# Patient Record
Sex: Male | Born: 1935 | Race: White | Hispanic: No | Marital: Married | Smoking: Never smoker
Health system: Southern US, Community
[De-identification: ages and names within clinical notes are randomized; demographics above are authoritative.]

## PROBLEM LIST (undated history)

## (undated) DIAGNOSIS — M109 Gout, unspecified: Secondary | ICD-10-CM

## (undated) DIAGNOSIS — I2510 Atherosclerotic heart disease of native coronary artery without angina pectoris: Secondary | ICD-10-CM

## (undated) DIAGNOSIS — I219 Acute myocardial infarction, unspecified: Secondary | ICD-10-CM

## (undated) DIAGNOSIS — K219 Gastro-esophageal reflux disease without esophagitis: Secondary | ICD-10-CM

## (undated) DIAGNOSIS — T4145XA Adverse effect of unspecified anesthetic, initial encounter: Secondary | ICD-10-CM

## (undated) DIAGNOSIS — E785 Hyperlipidemia, unspecified: Secondary | ICD-10-CM

## (undated) DIAGNOSIS — Z9289 Personal history of other medical treatment: Secondary | ICD-10-CM

## (undated) DIAGNOSIS — T8859XA Other complications of anesthesia, initial encounter: Secondary | ICD-10-CM

## (undated) DIAGNOSIS — N200 Calculus of kidney: Secondary | ICD-10-CM

## (undated) DIAGNOSIS — I10 Essential (primary) hypertension: Secondary | ICD-10-CM

## (undated) DIAGNOSIS — I4901 Ventricular fibrillation: Secondary | ICD-10-CM

## (undated) DIAGNOSIS — C4490 Unspecified malignant neoplasm of skin, unspecified: Secondary | ICD-10-CM

## (undated) HISTORY — PX: LITHOTRIPSY: SUR834

## (undated) HISTORY — PX: SKIN CANCER EXCISION: SHX779

## (undated) HISTORY — PX: CYSTOSCOPY: SUR368

## (undated) HISTORY — PX: BACK SURGERY: SHX140

---

## 1996-12-03 HISTORY — PX: LUMBAR DISC SURGERY: SHX700

## 1997-12-03 HISTORY — PX: CORONARY ANGIOPLASTY WITH STENT PLACEMENT: SHX49

## 1998-07-01 ENCOUNTER — Inpatient Hospital Stay (HOSPITAL_COMMUNITY): Admission: EM | Admit: 1998-07-01 | Discharge: 1998-07-05 | Payer: Self-pay | Admitting: *Deleted

## 1998-07-19 ENCOUNTER — Encounter (HOSPITAL_COMMUNITY): Admission: RE | Admit: 1998-07-19 | Discharge: 1998-10-17 | Payer: Self-pay | Admitting: Cardiology

## 1999-04-25 ENCOUNTER — Encounter: Payer: Self-pay | Admitting: Emergency Medicine

## 1999-04-26 ENCOUNTER — Inpatient Hospital Stay (HOSPITAL_COMMUNITY): Admission: EM | Admit: 1999-04-26 | Discharge: 1999-04-26 | Payer: Self-pay | Admitting: Emergency Medicine

## 1999-10-27 ENCOUNTER — Emergency Department (HOSPITAL_COMMUNITY): Admission: EM | Admit: 1999-10-27 | Discharge: 1999-10-27 | Payer: Self-pay | Admitting: Emergency Medicine

## 1999-10-28 ENCOUNTER — Encounter: Payer: Self-pay | Admitting: Emergency Medicine

## 1999-11-09 ENCOUNTER — Ambulatory Visit (HOSPITAL_COMMUNITY): Admission: RE | Admit: 1999-11-09 | Discharge: 1999-11-09 | Payer: Self-pay | Admitting: Urology

## 1999-11-09 ENCOUNTER — Encounter: Payer: Self-pay | Admitting: Urology

## 1999-11-16 ENCOUNTER — Encounter: Payer: Self-pay | Admitting: Urology

## 1999-11-16 ENCOUNTER — Ambulatory Visit (HOSPITAL_COMMUNITY): Admission: RE | Admit: 1999-11-16 | Discharge: 1999-11-16 | Payer: Self-pay | Admitting: Urology

## 2000-05-17 ENCOUNTER — Encounter: Payer: Self-pay | Admitting: Orthopedic Surgery

## 2000-05-17 ENCOUNTER — Encounter: Admission: RE | Admit: 2000-05-17 | Discharge: 2000-05-17 | Payer: Self-pay | Admitting: Orthopedic Surgery

## 2002-05-15 ENCOUNTER — Encounter: Admission: RE | Admit: 2002-05-15 | Discharge: 2002-05-15 | Payer: Self-pay | Admitting: Family Medicine

## 2002-05-15 ENCOUNTER — Encounter: Payer: Self-pay | Admitting: Family Medicine

## 2003-12-04 HISTORY — PX: CYSTOSCOPY W/ STONE MANIPULATION: SHX1427

## 2003-12-25 ENCOUNTER — Emergency Department (HOSPITAL_COMMUNITY): Admission: EM | Admit: 2003-12-25 | Discharge: 2003-12-26 | Payer: Self-pay | Admitting: Emergency Medicine

## 2003-12-28 ENCOUNTER — Ambulatory Visit (HOSPITAL_COMMUNITY): Admission: AD | Admit: 2003-12-28 | Discharge: 2003-12-28 | Payer: Self-pay | Admitting: Urology

## 2004-06-17 ENCOUNTER — Emergency Department (HOSPITAL_COMMUNITY): Admission: EM | Admit: 2004-06-17 | Discharge: 2004-06-17 | Payer: Self-pay | Admitting: Emergency Medicine

## 2004-09-08 ENCOUNTER — Encounter (INDEPENDENT_AMBULATORY_CARE_PROVIDER_SITE_OTHER): Payer: Self-pay | Admitting: Specialist

## 2004-09-08 ENCOUNTER — Ambulatory Visit (HOSPITAL_COMMUNITY): Admission: RE | Admit: 2004-09-08 | Discharge: 2004-09-08 | Payer: Self-pay | Admitting: Gastroenterology

## 2004-12-03 DIAGNOSIS — I4901 Ventricular fibrillation: Secondary | ICD-10-CM

## 2004-12-03 HISTORY — PX: CORONARY ANGIOPLASTY WITH STENT PLACEMENT: SHX49

## 2004-12-03 HISTORY — DX: Ventricular fibrillation: I49.01

## 2005-09-05 ENCOUNTER — Inpatient Hospital Stay (HOSPITAL_COMMUNITY): Admission: AD | Admit: 2005-09-05 | Discharge: 2005-09-11 | Payer: Self-pay | Admitting: Cardiology

## 2008-11-17 ENCOUNTER — Observation Stay (HOSPITAL_COMMUNITY): Admission: EM | Admit: 2008-11-17 | Discharge: 2008-11-18 | Payer: Self-pay | Admitting: Emergency Medicine

## 2008-11-18 ENCOUNTER — Ambulatory Visit: Payer: Self-pay | Admitting: Vascular Surgery

## 2008-11-18 ENCOUNTER — Encounter (INDEPENDENT_AMBULATORY_CARE_PROVIDER_SITE_OTHER): Payer: Self-pay | Admitting: Family Medicine

## 2008-12-03 DIAGNOSIS — Z9289 Personal history of other medical treatment: Secondary | ICD-10-CM

## 2008-12-03 HISTORY — DX: Personal history of other medical treatment: Z92.89

## 2009-12-03 DIAGNOSIS — Z9289 Personal history of other medical treatment: Secondary | ICD-10-CM

## 2009-12-03 HISTORY — PX: CORONARY ARTERY BYPASS GRAFT: SHX141

## 2009-12-03 HISTORY — DX: Personal history of other medical treatment: Z92.89

## 2010-06-16 ENCOUNTER — Encounter: Payer: Self-pay | Admitting: Surgery

## 2010-06-16 ENCOUNTER — Inpatient Hospital Stay (HOSPITAL_COMMUNITY): Admission: RE | Admit: 2010-06-16 | Discharge: 2010-06-18 | Payer: Self-pay | Admitting: Cardiology

## 2010-06-17 ENCOUNTER — Ambulatory Visit: Payer: Self-pay | Admitting: Surgery

## 2010-06-23 ENCOUNTER — Inpatient Hospital Stay (HOSPITAL_COMMUNITY): Admission: RE | Admit: 2010-06-23 | Discharge: 2010-06-28 | Payer: Self-pay | Admitting: Surgery

## 2010-07-17 ENCOUNTER — Encounter: Admission: RE | Admit: 2010-07-17 | Discharge: 2010-07-17 | Payer: Self-pay | Admitting: Surgery

## 2010-07-17 ENCOUNTER — Ambulatory Visit: Payer: Self-pay | Admitting: Surgery

## 2010-12-23 ENCOUNTER — Encounter: Payer: Self-pay | Admitting: Emergency Medicine

## 2011-02-17 LAB — CBC
HCT: 29.6 % — ABNORMAL LOW (ref 39.0–52.0)
HCT: 29.7 % — ABNORMAL LOW (ref 39.0–52.0)
HCT: 32.7 % — ABNORMAL LOW (ref 39.0–52.0)
HCT: 39.1 % (ref 39.0–52.0)
HCT: 40.1 % (ref 39.0–52.0)
HCT: 44 % (ref 39.0–52.0)
Hemoglobin: 10.2 g/dL — ABNORMAL LOW (ref 13.0–17.0)
Hemoglobin: 10.3 g/dL — ABNORMAL LOW (ref 13.0–17.0)
Hemoglobin: 10.3 g/dL — ABNORMAL LOW (ref 13.0–17.0)
Hemoglobin: 11.1 g/dL — ABNORMAL LOW (ref 13.0–17.0)
Hemoglobin: 11.1 g/dL — ABNORMAL LOW (ref 13.0–17.0)
Hemoglobin: 13.2 g/dL (ref 13.0–17.0)
Hemoglobin: 13.6 g/dL (ref 13.0–17.0)
Hemoglobin: 15 g/dL (ref 13.0–17.0)
MCH: 32.7 pg (ref 26.0–34.0)
MCH: 32.9 pg (ref 26.0–34.0)
MCH: 33 pg (ref 26.0–34.0)
MCH: 33.1 pg (ref 26.0–34.0)
MCH: 33.3 pg (ref 26.0–34.0)
MCH: 33.4 pg (ref 26.0–34.0)
MCH: 33.7 pg (ref 26.0–34.0)
MCHC: 33.8 g/dL (ref 30.0–36.0)
MCHC: 33.9 g/dL (ref 30.0–36.0)
MCHC: 34 g/dL (ref 30.0–36.0)
MCHC: 34 g/dL (ref 30.0–36.0)
MCHC: 34 g/dL (ref 30.0–36.0)
MCHC: 34.2 g/dL (ref 30.0–36.0)
MCHC: 34.7 g/dL (ref 30.0–36.0)
MCV: 96.3 fL (ref 78.0–100.0)
MCV: 96.9 fL (ref 78.0–100.0)
MCV: 97.1 fL (ref 78.0–100.0)
MCV: 97.3 fL (ref 78.0–100.0)
MCV: 97.4 fL (ref 78.0–100.0)
MCV: 97.5 fL (ref 78.0–100.0)
Platelets: 104 10*3/uL — ABNORMAL LOW (ref 150–400)
Platelets: 108 10*3/uL — ABNORMAL LOW (ref 150–400)
Platelets: 116 10*3/uL — ABNORMAL LOW (ref 150–400)
Platelets: 123 10*3/uL — ABNORMAL LOW (ref 150–400)
Platelets: 135 10*3/uL — ABNORMAL LOW (ref 150–400)
Platelets: 150 10*3/uL (ref 150–400)
Platelets: 162 10*3/uL (ref 150–400)
RBC: 3.05 MIL/uL — ABNORMAL LOW (ref 4.22–5.81)
RBC: 3.05 MIL/uL — ABNORMAL LOW (ref 4.22–5.81)
RBC: 3.09 MIL/uL — ABNORMAL LOW (ref 4.22–5.81)
RBC: 3.39 MIL/uL — ABNORMAL LOW (ref 4.22–5.81)
RBC: 3.48 MIL/uL — ABNORMAL LOW (ref 4.22–5.81)
RBC: 4.01 MIL/uL — ABNORMAL LOW (ref 4.22–5.81)
RBC: 4.12 MIL/uL — ABNORMAL LOW (ref 4.22–5.81)
RBC: 4.55 MIL/uL (ref 4.22–5.81)
RDW: 12.7 % (ref 11.5–15.5)
RDW: 12.9 % (ref 11.5–15.5)
RDW: 13 % (ref 11.5–15.5)
RDW: 13.1 % (ref 11.5–15.5)
RDW: 13.1 % (ref 11.5–15.5)
RDW: 13.4 % (ref 11.5–15.5)
WBC: 12 10*3/uL — ABNORMAL HIGH (ref 4.0–10.5)
WBC: 12.6 10*3/uL — ABNORMAL HIGH (ref 4.0–10.5)
WBC: 14.1 10*3/uL — ABNORMAL HIGH (ref 4.0–10.5)
WBC: 6 10*3/uL (ref 4.0–10.5)
WBC: 6.8 10*3/uL (ref 4.0–10.5)
WBC: 6.9 10*3/uL (ref 4.0–10.5)
WBC: 9.9 10*3/uL (ref 4.0–10.5)

## 2011-02-17 LAB — GLUCOSE, CAPILLARY
Glucose-Capillary: 106 mg/dL — ABNORMAL HIGH (ref 70–99)
Glucose-Capillary: 110 mg/dL — ABNORMAL HIGH (ref 70–99)
Glucose-Capillary: 114 mg/dL — ABNORMAL HIGH (ref 70–99)
Glucose-Capillary: 114 mg/dL — ABNORMAL HIGH (ref 70–99)
Glucose-Capillary: 119 mg/dL — ABNORMAL HIGH (ref 70–99)
Glucose-Capillary: 126 mg/dL — ABNORMAL HIGH (ref 70–99)
Glucose-Capillary: 128 mg/dL — ABNORMAL HIGH (ref 70–99)
Glucose-Capillary: 129 mg/dL — ABNORMAL HIGH (ref 70–99)
Glucose-Capillary: 137 mg/dL — ABNORMAL HIGH (ref 70–99)
Glucose-Capillary: 141 mg/dL — ABNORMAL HIGH (ref 70–99)
Glucose-Capillary: 149 mg/dL — ABNORMAL HIGH (ref 70–99)

## 2011-02-17 LAB — COMPREHENSIVE METABOLIC PANEL
ALT: 19 U/L (ref 0–53)
ALT: 20 U/L (ref 0–53)
AST: 20 U/L (ref 0–37)
AST: 22 U/L (ref 0–37)
Albumin: 3.5 g/dL (ref 3.5–5.2)
Albumin: 3.8 g/dL (ref 3.5–5.2)
Alkaline Phosphatase: 47 U/L (ref 39–117)
Alkaline Phosphatase: 61 U/L (ref 39–117)
BUN: 13 mg/dL (ref 6–23)
BUN: 18 mg/dL (ref 6–23)
CO2: 22 mEq/L (ref 19–32)
CO2: 27 mEq/L (ref 19–32)
Calcium: 9.1 mg/dL (ref 8.4–10.5)
Calcium: 9.4 mg/dL (ref 8.4–10.5)
Chloride: 105 mEq/L (ref 96–112)
Chloride: 111 mEq/L (ref 96–112)
Creatinine, Ser: 0.87 mg/dL (ref 0.4–1.5)
Creatinine, Ser: 1.12 mg/dL (ref 0.4–1.5)
GFR calc Af Amer: >60 mL/min (ref >60)
GFR calc Af Amer: >60 mL/min (ref >60)
GFR calc non Af Amer: >60 mL/min (ref >60)
GFR calc non Af Amer: >60 mL/min (ref >60)
Glucose, Bld: 104 mg/dL — ABNORMAL HIGH (ref 70–99)
Glucose, Bld: 135 mg/dL — ABNORMAL HIGH (ref 70–99)
Potassium: 4.1 mEq/L (ref 3.5–5.1)
Potassium: 4.4 mEq/L (ref 3.5–5.1)
Sodium: 137 mEq/L (ref 135–145)
Sodium: 139 mEq/L (ref 135–145)
Total Bilirubin: 0.6 mg/dL (ref 0.3–1.2)
Total Bilirubin: 0.6 mg/dL (ref 0.3–1.2)
Total Protein: 6.1 g/dL (ref 6.0–8.3)
Total Protein: 6.5 g/dL (ref 6.0–8.3)

## 2011-02-17 LAB — POCT I-STAT 4, (NA,K, GLUC, HGB,HCT)
Glucose, Bld: 104 mg/dL — ABNORMAL HIGH (ref 70–99)
Glucose, Bld: 119 mg/dL — ABNORMAL HIGH (ref 70–99)
Glucose, Bld: 123 mg/dL — ABNORMAL HIGH (ref 70–99)
Glucose, Bld: 95 mg/dL (ref 70–99)
HCT: 29 % — ABNORMAL LOW (ref 39.0–52.0)
HCT: 29 % — ABNORMAL LOW (ref 39.0–52.0)
HCT: 29 % — ABNORMAL LOW (ref 39.0–52.0)
HCT: 33 % — ABNORMAL LOW (ref 39.0–52.0)
HCT: 38 % — ABNORMAL LOW (ref 39.0–52.0)
Hemoglobin: 13.9 g/dL (ref 13.0–17.0)
Potassium: 4.4 mEq/L (ref 3.5–5.1)
Potassium: 4.4 mEq/L (ref 3.5–5.1)
Sodium: 135 mEq/L (ref 135–145)
Sodium: 136 mEq/L (ref 135–145)
Sodium: 143 mEq/L (ref 135–145)

## 2011-02-17 LAB — BASIC METABOLIC PANEL
BUN: 19 mg/dL (ref 6–23)
BUN: 22 mg/dL (ref 6–23)
CO2: 23 mEq/L (ref 19–32)
CO2: 27 mEq/L (ref 19–32)
CO2: 28 mEq/L (ref 19–32)
Calcium: 8.7 mg/dL (ref 8.4–10.5)
Calcium: 8.9 mg/dL (ref 8.4–10.5)
Chloride: 105 mEq/L (ref 96–112)
Chloride: 107 mEq/L (ref 96–112)
Creatinine, Ser: 1.03 mg/dL (ref 0.4–1.5)
Creatinine, Ser: 1.3 mg/dL (ref 0.4–1.5)
GFR calc Af Amer: >60 mL/min (ref >60)
GFR calc Af Amer: >60 mL/min (ref >60)
GFR calc non Af Amer: 54 mL/min — ABNORMAL LOW (ref >60)
GFR calc non Af Amer: >60 mL/min (ref >60)
Glucose, Bld: 108 mg/dL — ABNORMAL HIGH (ref 70–99)
Glucose, Bld: 127 mg/dL — ABNORMAL HIGH (ref 70–99)
Glucose, Bld: 167 mg/dL — ABNORMAL HIGH (ref 70–99)
Potassium: 3.8 mEq/L (ref 3.5–5.1)
Potassium: 4.2 mEq/L (ref 3.5–5.1)
Potassium: 4.4 mEq/L (ref 3.5–5.1)
Sodium: 139 mEq/L (ref 135–145)
Sodium: 140 mEq/L (ref 135–145)
Sodium: 141 mEq/L (ref 135–145)

## 2011-02-17 LAB — POCT I-STAT 3, ART BLOOD GAS (G3+)
Acid-base deficit: 2 mmol/L (ref 0.0–2.0)
O2 Saturation: 96 %
O2 Saturation: 96 %
Patient temperature: 98.4
TCO2: 25 mmol/L (ref 0–100)
TCO2: 27 mmol/L (ref 0–100)
pCO2 arterial: 35.6 mmHg (ref 35.0–45.0)
pCO2 arterial: 45 mmHg (ref 35.0–45.0)
pH, Arterial: 7.357 (ref 7.350–7.450)
pH, Arterial: 7.397 (ref 7.350–7.450)
pO2, Arterial: 356 mmHg — ABNORMAL HIGH (ref 80.0–100.0)

## 2011-02-17 LAB — URINALYSIS, ROUTINE W REFLEX MICROSCOPIC
Bilirubin Urine: NEGATIVE
Ketones, ur: NEGATIVE mg/dL
Nitrite: NEGATIVE
Urobilinogen, UA: 1 mg/dL (ref 0.0–1.0)

## 2011-02-17 LAB — BLOOD GAS, ARTERIAL
Acid-base deficit: 1.7 mmol/L (ref 0.0–2.0)
Bicarbonate: 21.8 mEq/L (ref 20.0–24.0)
Drawn by: 181601
FIO2: 0.21 %
O2 Saturation: 98.5 %
Patient temperature: 98.6
TCO2: 22.8 mmol/L (ref 0–100)
pCO2 arterial: 32 mmHg — ABNORMAL LOW (ref 35.0–45.0)
pH, Arterial: 7.447 (ref 7.350–7.450)
pO2, Arterial: 101 mmHg — ABNORMAL HIGH (ref 80.0–100.0)

## 2011-02-17 LAB — DIFFERENTIAL
Basophils Absolute: 0 10*3/uL (ref 0.0–0.1)
Basophils Relative: 1 % (ref 0–1)
Eosinophils Absolute: 0.1 10*3/uL (ref 0.0–0.7)
Eosinophils Relative: 2 % (ref 0–5)
Lymphocytes Relative: 35 % (ref 12–46)
Lymphs Abs: 2.1 10*3/uL (ref 0.7–4.0)
Monocytes Absolute: 0.5 10*3/uL (ref 0.1–1.0)
Monocytes Relative: 8 % (ref 3–12)
Neutro Abs: 3.3 10*3/uL (ref 1.7–7.7)
Neutrophils Relative %: 55 % (ref 43–77)

## 2011-02-17 LAB — CREATININE, SERUM
Creatinine, Ser: 0.85 mg/dL (ref 0.4–1.5)
GFR calc Af Amer: >60 mL/min (ref >60)
GFR calc Af Amer: >60 mL/min (ref >60)
GFR calc non Af Amer: 55 mL/min — ABNORMAL LOW (ref >60)

## 2011-02-17 LAB — HEMOGLOBIN A1C
Hgb A1c MFr Bld: 5.4 % (ref <5.7)
Mean Plasma Glucose: 108 mg/dL (ref <117)

## 2011-02-17 LAB — POCT I-STAT, CHEM 8
BUN: 12 mg/dL (ref 6–23)
BUN: 21 mg/dL (ref 6–23)
Calcium, Ion: 1.15 mmol/L (ref 1.12–1.32)
Calcium, Ion: 1.24 mmol/L (ref 1.12–1.32)
Creatinine, Ser: 1 mg/dL (ref 0.4–1.5)
Glucose, Bld: 119 mg/dL — ABNORMAL HIGH (ref 70–99)
HCT: 31 % — ABNORMAL LOW (ref 39.0–52.0)
Sodium: 142 mEq/L (ref 135–145)
TCO2: 22 mmol/L (ref 0–100)
TCO2: 24 mmol/L (ref 0–100)

## 2011-02-17 LAB — PREPARE PLATELETS

## 2011-02-17 LAB — PROTIME-INR
INR: 0.99 (ref 0.00–1.49)
INR: 1.36 (ref 0.00–1.49)
Prothrombin Time: 13 seconds (ref 11.6–15.2)
Prothrombin Time: 16.7 seconds — ABNORMAL HIGH (ref 11.6–15.2)

## 2011-02-17 LAB — SURGICAL PCR SCREEN
MRSA, PCR: NEGATIVE
Staphylococcus aureus: NEGATIVE

## 2011-02-17 LAB — APTT
aPTT: 36 seconds (ref 24–37)
aPTT: 43 seconds — ABNORMAL HIGH (ref 24–37)

## 2011-02-17 LAB — TYPE AND SCREEN: Antibody Screen: NEGATIVE

## 2011-02-17 LAB — POCT I-STAT GLUCOSE: Glucose, Bld: 95 mg/dL (ref 70–99)

## 2011-02-17 LAB — MAGNESIUM: Magnesium: 2.7 mg/dL — ABNORMAL HIGH (ref 1.5–2.5)

## 2011-04-17 NOTE — Assessment & Plan Note (Signed)
OFFICE VISIT   Jose [name], Jose [name]  DOB:  1936/02/28                                        July 17, 2010  CHART #:  [id number]   HISTORY OF PRESENT ILLNESS:  The patient is status post coronary artery  bypass grafting x4 done by Dr. Laneta Simmers, June 24, 2010.  His postoperative  course was pretty much unremarkable and he was discharged to home postop  day #4 in stable condition.  The patient presents today for his 3-week  followup visit.  The patient is without complaints.  He saw Dr. Lynnea Ferrier  last week, who felt that he was progressing well.  He denies change in  any of his medicines and plans to follow up in the next several months.  The patient had some left-sided chest numbness, but other than that  denies any incisional pain.  He is walking about a mile and a half at a  time with a slight incline without any difficulty.  He denies any  shortness of breath.  The patient is tolerating diet well.  No nausea or  vomiting.  He plans to proceed with cardiac rehab in the next week.  The  patient states [facility] and will [facility] cardiac [facility], but  if he can do the same activities at the [facility], will just go to the [facility]  for rehab.   PHYSICAL EXAMINATION:  Vital Signs:  Blood pressure 148/88, pulse 72,  respirations 16, and O2 sats 95% on room air.  Respiratory:  Clear to  auscultation bilaterally.  Cardiac:  Regular rate and rhythm.  No  murmurs noted.  Chest:  Sternum is stable.  Abdomen:  Bowel sounds x4.  Soft and nontender.  Extremities:  No edema noted.  Incisions:  All  incisions are healing well.   STUDIES:  The patient had a PA and lateral chest x-ray done today,  July 17, 2010, which is stable.  No pleural effusions noted.  Sternal  wires intact.  Lungs with good aeration.   IMPRESSION AND PLAN:  The patient is progressing well postoperatively.  Plan at this time is to continue ambulating 3-4 times per day.  The  patient is instructed still  no heavy lifting over 10 pounds for another  2 months.  He is encouraged to do cardiac rehab.  The patient will plan  to follow up with Dr. Lynnea Ferrier as directed.  At this time, the patient is  felt to be stable from surgical  standpoint and we will release him from the office.  He is instructed to  contact us if he has any surgical questions, but will continue to follow  up with Dr. Lynnea Ferrier.  The patient is in agreement.   Evelene Croon, M.D.  Electronically Signed   KMD/MEDQ  D:  07/17/2010  T:  07/18/2010  Job:  [id number]   cc:   Ritta Slot, MD

## 2011-04-17 NOTE — Procedures (Signed)
EEG NUMBER:  [id number].   CLINICAL HISTORY:  A 75 year old man admitted for syncope.  EEG is  performed for evaluation.  This is a portable EEG done with photic  stimulation and hyperventilation.  The patient is described as awake and  alert.   DESCRIPTION:  The dominant rhythm in this tracing is a moderate  amplitude alpha rhythm of 10 Hz, which predominates posteriorly, appears  without abnormal asymmetry, and attenuates with eye opening and closing.  Low amplitude fast activity is seen frontally and centrally and appears  without abnormal asymmetry.  No focal slowing is noted and no  epileptiform discharge is seen.  The patient remained in the awake state  throughout the recording.  Photic stimulation produced symmetric driving  responses.  Hyperventilation produced some increased prominence of the  alpha without appearance of focal abnormality.  Single channel devoted  to EKG revealed sinus rhythm throughout at the rate of approximately 60  beats minute.   CONCLUSION:  Normal study in awake state.      Michael L. Thad Ranger, M.D.  Electronically Signed     ZOX:WRUE  D:  11/18/2008 17:19:53  T:  11/19/2008 04:23:45  Job #:  [id number]

## 2011-04-20 NOTE — Op Note (Signed)
NAME:  Jose [name], Jose [name]                          ACCOUNT NO.:  [id number]   MEDICAL RECORD NO.:  [id number]                   PATIENT TYPE:  AMB   LOCATION:  DAY                                  FACILITY:  [facility]   PHYSICIAN:  Ronald L. Ovidio Hanger, M.D.           DATE OF BIRTH:  09-16-1936   DATE OF PROCEDURE:  12/28/2003  DATE OF DISCHARGE:                                 OPERATIVE REPORT   DIAGNOSES:  Right distal ureterolithiasis with obstruction, nausea, and  vomiting.   OPERATIVE PROCEDURE:  1. Cystourethroscopy and right ureteroscopy with holmium laser lithotripsy.  2. Stone basket extraction.  3. Placement of right double-J stent.   SURGEON:  Lucrezia Starch. Earlene Plater, MD   ASSISTANT:  Susanne Borders, MD   ANESTHESIA:  LMA.   ESTIMATED BLOOD LOSS:  Negligible.   TUBES:  A 26 cm 6 French Cook double-pigtail stent.   COMPLICATIONS:  None.   INDICATION FOR PROCEDURE:  Jose [name] is a very nice, 75 year old, white  male, who had a known 4 x 7 mm right upper ureteral stone.  He was seen  yesterday and really was asymptomatic.  The stone had moved into the lower  ureter.  He wanted to give it a chance to pass.  It was oriented in the 4 mm  direction.  But today, he developed nausea, vomiting, had some sweats and  chills, and really could not be controlled pain-wise.  After understanding  the risks, benefits, and alternatives, he has elected to proceed with the  above-procedure.   PROCEDURE IN DETAIL:  The patient was placed in a supine position.  After  proper LMA anesthesia, was placed in the dorsal lithotomy position and  prepped and draped with Betadine in a sterile fashion.  Cystourethroscopy  was performed with a 22.5 French Olympus panendoscope, and the orifices were  identified, each noted to have moderate trilobar hypertrophy; grade 1  trabeculation was noted.  Efflux of urine was noted clear.  Urine was noted  from the left ureteral orifice but not from the right ureteral  orifice.  Under fluoroscopic guidance, a 0.038 French Teflon-coated guidewire was  placed into the right renal pelvis and utilizing the short thin  ureteroscope, the ureteral orifice was negotiated, and the stone was  visualized, and the lower third ureter was quite large, black and felt to be  quite hard.  Utilizing the 365 holmium laser fiber, we had initial treatment  of 0.5 frequency of 5.  The stone was fragmented.  It was noted to be quite  hard, so the frequency was increased to 10, and it was finally broken into  approximately 6 fragments. Utilizing the nitinol basket, each fragment was  extracted and displaced into the bladder.  They were quite tight at the  distal orifice, but it opened and dilated without perforation or tearing of  the mucosa of significance.  Reinspection of the lower third ureter  revealed  there were no perforations.  No more fragments were remaining.  Under  fluoroscopic guidance, a 26 cm 6 French Cook double-pigtail stent was placed  and noted to be in good position in the right renal pelvis and within the  bladder.  It was felt that with the significant manipulation, edema, etc.,  that no prolapsed ring would be attached, and it would be left in for  approximately 2 weeks.  The  bladder was drained.  The stent was in good position fluoroscopically in the  right renal pelvis and within the bladder.  The bladder was drained.  The  panendoscope was removed, and the patient was taken to the recovery room  stable, and fragments will be presented for stone analysis.                                               Ronald L. Ovidio Hanger, M.D.    RLD/MEDQ  D:  12/28/2003  T:  12/28/2003  Job:  [id number]

## 2011-04-20 NOTE — Cardiovascular Report (Signed)
Jose [name], Jose [name]                ACCOUNT NO.:  [id number]   MEDICAL RECORD NO.:  [id number]          PATIENT TYPE:  INP   LOCATION:  6522                         FACILITY:  [facility]   PHYSICIAN:  Madaline Savage, M.D.DATE OF BIRTH:  06-15-1936   DATE OF PROCEDURE:  09/10/2005  DATE OF DISCHARGE:                              CARDIAC CATHETERIZATION   PROCEDURES PERFORMED:  1.  Selective coronary angiography by Judkins technique.  2.  Percutaneous coronary stenting of the mid left anterior descending      coronary artery.   COMPLICATIONS:  None.   ENTRY SITE:  Right femoral.   DYE USED:  Omnipaque.   CATHETERS USED:  A 6-French XB-4 LAD by [employer], angioplasty wire was a  Research scientist (physical sciences) by [employer]. Stent used was a 2.5 x 13 mm Cypher stent. I used a  2.75 x 6 mm cutting balloon angioplasty at the inflow portion of the stent  which was hazy after successful stenting.   PATIENT PROFILE:  Jose [name] is a pleasant 75 year old gentleman who has  previously had mid-LAD stenting for acute myocardial infarction on July 01, 1998. The patient again had an acute inferior myocardial infarction on the  night of September 05, 2005 and Dr. Jeanella Cara did a cardiac catheterization and  percutaneous intervention on him. The mid-RCA stent was patent. There was  occlusive thrombus and a stenosis in the distal RCA just before it  bifurcated into PLA and PDA that Dr. Jacinto Halim treated with AngioJet therapy  followed by coronary artery stenting. Today's procedure was electively  performed because during cardiac catheterization on September 05, 2005 for  acute inferior MI. Dr. Jacinto Halim also noted that the LAD stenosis just beyond  diagonal branch #2 and beyond septal perforator branch #3 contained worse  stenosis then it did in 1999. It was previously 60-70% and now was 85%. So  we did this intervention today after performing right coronary angiography  and then focusing in on the LAD lesion which was best viewed in a 13  degrees  RAO projection with 31 degrees cranial angulation.   The patient was treated with heparin and then ReoPro and the ACT during the  case was 340 seconds. The guide catheter provided excellent backup support.  The guidewire traversed the stenosis in the LAD very well. The stent crossed  the lesion with ease and was deployed to 15 atmospheres on two occasions  causing marked ST-segment elevation which limited balloon time to 50 seconds  the first time and 45 seconds the second time. After doing the balloon  inflation on the stent, the stent itself showed a step-up and step-down in  the inflow portion of the LAD. Leading into the stent was a hazy, so I used  a 2.75 x 6 mm cutting balloon inflated to 6 atmospheres of pressure and  after doing so the area at the inflow portion of the stent was reduced from  about 40% stenosis to 0% residual. There was preservation of TIMI III flow.   The patient was then transferred to the holding unit in stable condition and  no  complications occurred.   FINAL DIAGNOSIS:  Successful direct coronary artery stenting of the mid left  anterior descending coronary artery with a touch up to the inflow portion of  the stent with a cutting balloon. Lesion was reduced from 85% to 0. The  patient will receive ReoPro for another 12 hours and will continue on his  Plavix which was started on September 05, 2005.           ______________________________  Madaline Savage, M.D.     WHG/MEDQ  D:  09/10/2005  T:  09/10/2005  Job:  [id number]   cc:   Cardiac Catheter Lab   Medical Records

## 2011-04-20 NOTE — Discharge Summary (Signed)
Jose [name], Jose [name]                ACCOUNT NO.:  [id number]   MEDICAL RECORD NO.:  [id number]          PATIENT TYPE:  INP   LOCATION:  6522                         FACILITY:  [facility]   PHYSICIAN:  Madaline Savage, M.D.DATE OF BIRTH:  03-19-1936   DATE OF ADMISSION:  09/05/2005  DATE OF DISCHARGE:  09/11/2005                                 DISCHARGE SUMMARY   DATE OF ADMISSION:  September 05, 2005.   DATE OF DISCHARGE:  September 11, 2005   ADMISSION DIAGNOSES:  1.  Acute ST inferior myocardial infarction with ventricular fibrillation      arrest.  2.  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  3.  Hypertension.  4.  Hyperlipidemia.  5.  History of gout.   DISCHARGE DIAGNOSES:  1.  Acute ST inferior myocardial infarction with ventricular fibrillation      arrest.  2.  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  3.  Hypertension.  4.  Hyperlipidemia.  5.  History of gout.   PROCEDURES:  1.  Emergent cardiac catheterization September 05, 2005 which revealed      occlusion of the right coronary artery.  Patient underwent AngioJet      percutaneous coronary intervention, CYPHER stenting of the right      coronary artery.  Left anterior descending showed a 90% stenosis with a      99% first diagonal, less than a 1 mm vessel.  Ejection fraction 55-60%      September 05, 2005.  2.  Percutaneous coronary intervention and stenting of the left anterior      descending, 85% to zero with a CYPHER stent by Dr. Chanda Busing.   BRIEF HISTORY:  The  patient is a 75 year old white male medical patient of  Dr. Elsie Lincoln.  As noted above, he had a previous MI with PCA and stenting of  his RCA in 1997.  He has undergone repeat catheterization in 2000 which  showed minimal end-stent stenosis of the RCA and a proximal LAD with a 50%  lesion in the mid LAD with a 75% lesion.  He underwent Cardiolite on March 15, 2005.  At that time, there was no evidence of ischemia.  EF was 59% and  no change from his previous studies.   The patient presented today around 11 a.m.  While aerating his lawn, he  began to have mid sternal chest pain with dizziness.  No nausea.  Eventually, he asked his wife to take him to the emergency room.  EKG showed  acute inferior MI.  He was at [facility]  and transported  emergently from [facility] ER by [facility] to the cath lab at [facility]  On arrival to the cath lab, the patient fibrillated and had to be  shocked twice with 200 joules biphasic the patient returned to a sinus  brady.  He did lose consciousness, but came back and was conscious with good  mentation.   PAST MEDICAL HISTORY:  As above.  He has a history of back surgery, foot  surgery, hypertension, hyperlipidemia, gout, and GERD.   For further history and physical, please see the dictated note.   MEDICATIONS ON ADMISSION:  Crestor 10 mg daily, aspirin 81 mg daily, and  atenolol 25 mg daily.   HOSPITAL COURSE:  Patient was admitted, taken directly to the cath lab from  the ER at [facility].  There patient cath showed occlusion of the  distal RCA beyond the site of the initial stent from 1997.  This area  underwent AngioJet, the thrombus was removed, PCI and stenting was performed  by Dr. Jacinto Halim.  The patient also had significant disease with a 99% ostial  first diagonal and a distal 90% stenosis.  His ejection fraction was between  55 and 60%.  He tolerated this procedure well, returned to the coronary care  unit, where he  was stabilized.  Symptoms resolved and he was doing well, he  was opted to go back on September 10, 2005 at which time a repeat cath and PCI  of his LAD was performed.  Dr. Truett Perna note notes that it had a reduction  from 85 to 0% stenosis using a 2.5 x 13 CYPHER stent.  The patient tolerated  the procedure well.  The following morning, he was alert and oriented.  His  groin site looked good.  He was having no chest discomfort.  Labs were  normal.  Hemoglobin was 14.4, hematocrit was 41, white count was 72,000,  platelets were 208,000.  Electrolytes were normal.  BUN was 16, creatinine  1.2, glucose 107.  CK-MBs were normal.  LDL on admission was 73, HDL was 41.  At that point, it was Dr. Truett Perna opinion he could be discharged home and  planned to see the patient back in two weeks.   DISCHARGE MEDICATIONS:  Aldactone 25 mg daily, Altace 2.5 mg daily, Coreg  6.25 mg b.i.d., Crestor 10 mg daily, Ecotrin 325 mg daily, Plavix 75 mg  daily, Protonix 40 mg daily.   DISCHARGE ACTIVITY:  Light to moderate.  He is not to return to work until  he returns to see Dr. Elsie Lincoln in two weeks.  Our office will call with the  appointment.   CONDITION ON DISCHARGE:  Improved.   TSH on admission was 1.7.      Eber Hong, P.A.    ______________________________  Madaline Savage, M.D.    WDJ/MEDQ  D:  09/11/2005  T:  09/11/2005  Job:  [id number]   cc:   Ellin Saba., M.D.  [address] [place]  [place] [address]

## 2011-04-20 NOTE — Cardiovascular Report (Signed)
Jose [name], [name]                ACCOUNT NO.:  [id number]   MEDICAL RECORD NO.:  [id number]          PATIENT TYPE:  EMS   LOCATION:  ED                           FACILITY:  [facility]   PHYSICIAN:  Jose Hilts. Jacinto Halim, MD       DATE OF BIRTH:  December 27, 1935   DATE OF PROCEDURE:  09/05/2005  DATE OF DISCHARGE:                              CARDIAC CATHETERIZATION   PROCEDURE PERFORMED:  1.  Defibrillation for VF arrest.  2.  Left ventriculography.  3.  Selective right and left coronary angiography.  4.  Placement of temporary transvenous pacemaker.  5.  Rheolytic thrombectomy using Angioject catheter.  6.  Percutaneous transluminal coronary angiography and stenting of the      distal right coronary artery.   INDICATIONS FOR PROCEDURE:  Jose [name] is a 75 year old gentleman with a  history of known coronary artery disease status post angioplasty to his mid  RCA in the past for an acute myocardial infarction on June 14, 1998, and  history of a mid LAD stenosis of about 70% which was being medically managed  was doing well until this morning when he was mowing his lawn and suddenly  had severe chest discomfort.  Given this, he was admitted to [facility] and was found to have ST elevation myocardial infarction in the  inferior and lateral leads.  Given this, he was emergently transferred to  [facility] for emergent cardiac catheterization.  In the cath lab,  patietn had cardiac arrest and the ventricular fibrillation was shocked to  sinus rhythm and caardiac catheterization was performed emergently.   HEMODYNAMIC DATA:  Left ventricular pressure 145/10 with end diastolic  pressure of 18 mmHg.  The aortic pressure was 141/75 with a mean of 109  mmHg.   ANGIOGRAPHIC DATA:  Left ventricle:  The left ventricular systolic function was normal with  ejection fraction estimated at 55-60%.  There was mild basal and  inferoposterior wall hypokinesis.  There is no significant mitral  regurgitation.   Right coronary artery:  The right coronary artery is a large vessel and is a  dominant vessel.  The RCA is occluded in its distal segment with heavy  thrombus burden.  The distal RCA vascular bed was not visualized.   Left main:  The left main is a large caliber vessel and is angiographically  normal.   Circumflex:  The circumflex is a large caliber vessel.  This gives origin to  a large OM1, OM2, and moderate size OM3, and continues in the AV groove and  gives AV groove branch.  It has mild luminal irregularities.   Left anterior descending:  The LAD is a large caliber vessel.  It gives  origin to a moderate size first diagonal, small second diagonal, a moderate  size third diagonal, and several small diagonals.  The LAD has mild luminal  irregularity except after the origin of her third diagonal, there is a high  grade 90% stenosis.  The second diagonal also has an ostial 99% stenosis,  however, this is a very small less than 1  mm size vessel.  There were faint  collaterals noted to the RCA.   INTERVENTIONAL DATA:  Successful rheolytic thrombectomy using Angioject  catheter.  TIMI III flow was established immediately after thrombectomy.  Successful PTCA and stenting of the distal RCA with a 3 by 23 mm Cypher  deployed at 10 atmospheres pressure.  The stent was post dilated with 2.25  by 15 mm Quantum at 18 atmospheres pressure.  Overall, the stenosis was  reduced from 100% to 0% with TIMI III flow maintained at the end of the  procedure.   RECOMMENDATIONS:  1.  The fact that he has normal end diastolic function and there is a focal      high grade stenosis in the mid LAD, he will be discharged home once he      has acute recovery from his acute myocardial infarction, he will be      brought back for elective PTCA of the LAD.  2.  Continued risk factor modification is indicated.   A total of 155 mL of contrast was utilized for diagnostic and interventional   procedure.   TECHNIQUES OF PROCEDURE:  In the usual sterile technique, using a 7 French  right femoral artery access and a 6 French right femoral venous access, left  heart catheterization was performed using a 6 [place] multipurpose B2  catheter.  A 6 [place] multipurpose B2 catheter was advanced to the ascending  over a 0.035 inch J-wire.  The catheter was gently advanced to the left  ventricle.  Left ventricular pressure was monitored.  Hand contrast  injection of the left ventricle was performed, both in LAO and RAO  projections.  The catheter was flushed and pulled back into the ascending  aorta and pressure gradient across the aortic valve was monitored.  The  right coronary artery was selectively engaged and angiography was performed.  Then, the catheter was pulled back into the arch of the aorta and left  subclavian arteriogram with visualization of LIMA was performed.  A 6 French  Judkins diagnostic catheter was utilized to engage the left main coronary  artery and angiography was performed.  Then, the catheter was pulled out of  the body in the usual fashion.   TECHNIQUES OF INTERVENTION:  A balloon tip pacemaker was inserted through  the venous sheath into the right ventricle and ventricular capture was  easily obtained.  This was put on a standby mode.  Then, using a 95 mm by  0.014 inch Asahi Prowater guide-wire, the occluded artery was easily  traversed and the tip of the guide-wire was carefully placed in the distal  RCA.  Then, using 6 [place] Angioject catheter, rheolytic thrombectomy was  performed.  TIMI III flow was established.  During the procedure, ReoPro and  heparin were utilized for anticoagulation.  After establishing TIMI III  flow, the unstable plaque which was noted in the distal LAD was easily  visualized.  Measuring the lesion length and covering the stent into the  normal segment, a 2 by 23 mm Cypher was carefully positioned at the bifurcation of the distal  RCA which had a large PLA and a large PDA.  The  stent was deployed at 16 atmospheres pressure for 40 seconds.  The balloon  was deflated, pulled back into the guiding catheter, angiography was  performed.  Excellent results were noted.  This stent was post dilated with  a 3.25 by 15 mm Quantum balloon at 18 atmospheres of pressure.  Then, the  balloon was deflated and pulled back in the guiding catheter and angiography  was performed.  Excellent results were noted.  There was no evidence of  dissection or thrombus during the procedure.   Please note as soon as I established TIMI III flow into the RCA, I did  administer 60 mg intracoronary Adenosine.  Several times, I did also  administered 200 mg intracoronary nitroglycerin.  Overall, there were no  immediate complications.  Excellent reperfusion was obtained from transfer  to the balloon, time being less than 75 minutes.   RECOMMENDATIONS:  The patient will be transferred to the intensive care unit  for observation.      Jose Hilts. Jacinto Halim, MD  Electronically Signed     JRG/MEDQ  D:  09/05/2005  T:  09/05/2005  Job:  [id number]   cc:   Madaline Savage, M.D.  Fax: [phone]

## 2011-09-07 LAB — POCT CARDIAC MARKERS
CKMB, poc: 4 ng/mL (ref 1.0–8.0)
CKMB, poc: 4.3 ng/mL (ref 1.0–8.0)
Troponin i, poc: <0.05 ng/mL (ref 0.00–0.09)

## 2011-09-07 LAB — URINALYSIS, ROUTINE W REFLEX MICROSCOPIC
Bilirubin Urine: NEGATIVE
Hgb urine dipstick: NEGATIVE
Ketones, ur: NEGATIVE mg/dL
Specific Gravity, Urine: 1.013 (ref 1.005–1.030)
pH: 6.5 (ref 5.0–8.0)

## 2011-09-07 LAB — POCT I-STAT, CHEM 8
Glucose, Bld: 94 mg/dL (ref 70–99)
HCT: 47 % (ref 39.0–52.0)
Hemoglobin: 16 g/dL (ref 13.0–17.0)
Potassium: 4.4 mEq/L (ref 3.5–5.1)

## 2011-09-07 LAB — DIFFERENTIAL
Eosinophils Absolute: 0 10*3/uL (ref 0.0–0.7)
Lymphocytes Relative: 19 % (ref 12–46)
Lymphs Abs: 1.2 10*3/uL (ref 0.7–4.0)
Neutro Abs: 4.6 10*3/uL (ref 1.7–7.7)
Neutrophils Relative %: 72 % (ref 43–77)

## 2011-09-07 LAB — URINE CULTURE: Colony Count: NO GROWTH

## 2011-09-07 LAB — CK TOTAL AND CKMB (NOT AT ARMC)
CK, MB: 5.8 ng/mL — ABNORMAL HIGH (ref 0.3–4.0)
Relative Index: 3 — ABNORMAL HIGH (ref 0.0–2.5)

## 2011-09-07 LAB — BASIC METABOLIC PANEL
BUN: 11 mg/dL (ref 6–23)
BUN: 14 mg/dL (ref 6–23)
Calcium: 9.3 mg/dL (ref 8.4–10.5)
Calcium: 9.5 mg/dL (ref 8.4–10.5)
Creatinine, Ser: 0.98 mg/dL (ref 0.4–1.5)
GFR calc Af Amer: >60 mL/min (ref >60)
GFR calc non Af Amer: >60 mL/min (ref >60)
GFR calc non Af Amer: >60 mL/min (ref >60)
Glucose, Bld: 99 mg/dL (ref 70–99)
Sodium: 142 mEq/L (ref 135–145)

## 2011-09-07 LAB — GLUCOSE, CAPILLARY: Glucose-Capillary: 99 mg/dL (ref 70–99)

## 2011-09-07 LAB — CARDIAC PANEL(CRET KIN+CKTOT+MB+TROPI)
CK, MB: 4.2 ng/mL — ABNORMAL HIGH (ref 0.3–4.0)
Relative Index: 2.7 — ABNORMAL HIGH (ref 0.0–2.5)

## 2011-09-07 LAB — CBC
MCV: 95.6 fL (ref 78.0–100.0)
Platelets: 163 10*3/uL (ref 150–400)
WBC: 6.3 10*3/uL (ref 4.0–10.5)

## 2011-09-07 LAB — APTT: aPTT: 33 seconds (ref 24–37)

## 2011-09-07 LAB — PROTIME-INR
INR: 1 (ref 0.00–1.49)
Prothrombin Time: 13.2 seconds (ref 11.6–15.2)

## 2012-05-13 ENCOUNTER — Emergency Department (HOSPITAL_COMMUNITY)
Admission: EM | Admit: 2012-05-13 | Discharge: 2012-05-13 | Disposition: A | Discharge status: Home / Self Care | Payer: Medicare Other | Attending: Emergency Medicine | Admitting: Emergency Medicine

## 2012-05-13 ENCOUNTER — Encounter (HOSPITAL_COMMUNITY): Payer: Self-pay | Admitting: *Deleted

## 2012-05-13 ENCOUNTER — Other Ambulatory Visit: Payer: Self-pay

## 2012-05-13 DIAGNOSIS — R079 Chest pain, unspecified: Secondary | ICD-10-CM | POA: Insufficient documentation

## 2012-05-13 DIAGNOSIS — K219 Gastro-esophageal reflux disease without esophagitis: Secondary | ICD-10-CM

## 2012-05-13 DIAGNOSIS — R9431 Abnormal electrocardiogram [ECG] [EKG]: Secondary | ICD-10-CM | POA: Insufficient documentation

## 2012-05-13 DIAGNOSIS — I10 Essential (primary) hypertension: Secondary | ICD-10-CM | POA: Insufficient documentation

## 2012-05-13 DIAGNOSIS — I25810 Atherosclerosis of coronary artery bypass graft(s) without angina pectoris: Secondary | ICD-10-CM | POA: Insufficient documentation

## 2012-05-13 HISTORY — DX: Essential (primary) hypertension: I10

## 2012-05-13 HISTORY — DX: Atherosclerotic heart disease of native coronary artery without angina pectoris: I25.10

## 2012-05-13 LAB — DIFFERENTIAL
Lymphs Abs: 1.9 10*3/uL (ref 0.7–4.0)
Monocytes Relative: 10 % (ref 3–12)
Neutro Abs: 3.2 10*3/uL (ref 1.7–7.7)
Neutrophils Relative %: 55 % (ref 43–77)

## 2012-05-13 LAB — COMPREHENSIVE METABOLIC PANEL
Albumin: 3.8 g/dL (ref 3.5–5.2)
Alkaline Phosphatase: 67 U/L (ref 39–117)
BUN: 22 mg/dL (ref 6–23)
Chloride: 106 mEq/L (ref 96–112)
Glucose, Bld: 148 mg/dL — ABNORMAL HIGH (ref 70–99)
Potassium: 3.9 mEq/L (ref 3.5–5.1)
Total Bilirubin: 0.4 mg/dL (ref 0.3–1.2)

## 2012-05-13 LAB — CBC
Hemoglobin: 15.2 g/dL (ref 13.0–17.0)
RBC: 4.74 MIL/uL (ref 4.22–5.81)

## 2012-05-13 LAB — TROPONIN I: Troponin I: <0.3 ng/mL (ref <0.30)

## 2012-05-13 NOTE — ED Notes (Signed)
Pt reports he has chest pain onset around 8pm, stated he took 1 nitro at home, and got pain relief a little bit after that. Pt denies chest pain and sob now. Pt reports that he feels he chest is tender to touch, and states that he has been working out and thinks he may have pulled something, stated that he has had that tenderness X 1 week.

## 2012-05-13 NOTE — Discharge Instructions (Signed)
Chest Pain (Nonspecific) It is often hard to give a specific diagnosis for the cause of chest pain. There is always a chance that your pain could be related to something serious, such as a heart attack or a blood clot in the lungs. You need to follow up with your caregiver for further evaluation. CAUSES   Heartburn.   Pneumonia or bronchitis.   Anxiety or stress.   Inflammation around your heart (pericarditis) or lung (pleuritis or pleurisy).   A blood clot in the lung.   A collapsed lung (pneumothorax). It can develop suddenly on its own (spontaneous pneumothorax) or from injury (trauma) to the chest.   Shingles infection (herpes zoster virus).  The chest wall is composed of bones, muscles, and cartilage. Any of these can be the source of the pain.  The bones can be bruised by injury.   The muscles or cartilage can be strained by coughing or overwork.   The cartilage can be affected by inflammation and become sore (costochondritis).  DIAGNOSIS  Lab tests or other studies, such as X-rays, electrocardiography, stress testing, or cardiac imaging, may be needed to find the cause of your pain.  TREATMENT   Treatment depends on what may be causing your chest pain. Treatment may include:   Acid blockers for heartburn.   Anti-inflammatory medicine.   Pain medicine for inflammatory conditions.   Antibiotics if an infection is present.   You may be advised to change lifestyle habits. This includes stopping smoking and avoiding alcohol, caffeine, and chocolate.   You may be advised to keep your head raised (elevated) when sleeping. This reduces the chance of acid going backward from your stomach into your esophagus.   Most of the time, nonspecific chest pain will improve within 2 to 3 days with rest and mild pain medicine.  HOME CARE INSTRUCTIONS   If antibiotics were prescribed, take your antibiotics as directed. Finish them even if you start to feel better.   For the next few  days, avoid physical activities that bring on chest pain. Continue physical activities as directed.   Do not smoke.   Avoid drinking alcohol.   Only take over-the-counter or prescription medicine for pain, discomfort, or fever as directed by your caregiver.   Follow your caregiver's suggestions for further testing if your chest pain does not go away.   Keep any follow-up appointments you made. If you do not go to an appointment, you could develop lasting (chronic) problems with pain. If there is any problem keeping an appointment, you must call to reschedule.  SEEK MEDICAL CARE IF:   You think you are having problems from the medicine you are taking. Read your medicine instructions carefully.   Your chest pain does not go away, even after treatment.   You develop a rash with blisters on your chest.  SEEK IMMEDIATE MEDICAL CARE IF:   You have increased chest pain or pain that spreads to your arm, neck, jaw, back, or abdomen.   You develop shortness of breath, an increasing cough, or you are coughing up blood.   You have severe back or abdominal pain, feel nauseous, or vomit.   You develop severe weakness, fainting, or chills.   You have a fever.  THIS IS AN EMERGENCY. Do not wait to see if the pain will go away. Get medical help at once. Call your local emergency services (911 in [place] Do not drive yourself to the hospital. MAKE SURE YOU:   Understand these instructions.     Will watch your condition.   Will get help right away if you are not doing well or get worse.  Document Released: 08/29/2005 Document Revised: 11/08/2011 Document Reviewed: 06/24/2008 [employer] Patient Information 2012 [employer]. 

## 2012-05-13 NOTE — ED Notes (Signed)
The pt has had lt upper chest pain  Since 2000 tonight.  No nv or  Dizziness.  He took a sl nitro with ?? relief

## 2012-05-13 NOTE — ED Provider Notes (Signed)
History     CSN: [id number]  Arrival date & time 05/13/12  2206   First MD Initiated Contact with Patient 05/13/12 2240      Chief Complaint  Patient presents with  . Chest Pain    HPI The pt has had lt upper chest pain Since 2000 tonight. No nv or Dizziness.  Patient stated he had a lot of belching and took some vinegar prior to coming.  He is now asymptomatic.  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  He walks daily and runs about 2 miles on a treadmill.  He denies any symptoms with pain.  He denies any diaphoresis or nausea or shortness of breath denied.  Past Medical History  Diagnosis Date  . Coronary artery disease   . Hypertension     Past Surgical History  Procedure Date  . Coronary artery bypass graft   . Stents     No family history on file.  History  Substance Use Topics  . Smoking status: Never Smoker   . Smokeless tobacco: Not on file  . Alcohol Use: Yes      Review of Systems  All other systems reviewed and are negative.    Allergies  Cozaar and Plavix  Home Medications   Current Outpatient Rx  Name Route Sig Dispense Refill  . AMLODIPINE BESYLATE 2.5 MG PO TABS Oral Take 2.5 mg by mouth daily.    . ASPIRIN EC 81 MG PO TBEC Oral Take 81 mg by mouth daily.    . ATENOLOL 25 MG PO TABS Oral Take 25 mg by mouth daily.    . OMEGA-3 FATTY ACIDS 1000 MG PO CAPS Oral Take 1 g by mouth daily.    Marland Kitchen SIMVASTATIN 20 MG PO TABS Oral Take 20 mg by mouth every evening.      BP 132/72  Pulse 62  Resp 11  SpO2 96%  Physical Exam  Nursing note and vitals reviewed. Constitutional: He is oriented to person, place, and time. He appears well-developed and well-nourished. No distress.  HENT:  Head: Normocephalic and atraumatic.  Eyes: Pupils are equal, round, and reactive to light.  Neck: Normal range of motion.  Cardiovascular: Normal rate and intact distal pulses.        Normal sinus rhythm Rate = 70 Left axis deviation Nonspecific T wave  abnormality Abnormal EKG  Pulmonary/Chest: No respiratory distress.  Abdominal: Normal appearance. He exhibits no distension.  Musculoskeletal: Normal range of motion.  Neurological: He is alert and oriented to person, place, and time. No cranial nerve deficit.  Skin: Skin is warm and dry. No rash noted.  Psychiatric: He has a normal mood and affect. His behavior is normal.    ED Course  Procedures (including critical care time)  Labs Reviewed  COMPREHENSIVE METABOLIC PANEL - Abnormal; Notable for the following:    Glucose, Bld 148 (*)    GFR calc non Af Amer 68 (*)    GFR calc Af Amer 79 (*)    All other components within normal limits  CBC  DIFFERENTIAL  TROPONIN I   No results found.   1. Chest pain   2. GERD (gastroesophageal reflux disease)       MDM  After treatment in the ED the patient feels back to baseline and wants to go home.         Nelia Shi, MD 05/13/12 2322

## 2012-06-18 ENCOUNTER — Encounter (HOSPITAL_COMMUNITY): Payer: Self-pay | Admitting: Certified Registered Nurse Anesthetist

## 2012-06-18 ENCOUNTER — Encounter (HOSPITAL_COMMUNITY)
Admission: EM | Disposition: A | Discharge status: Home / Self Care | Payer: Self-pay | Source: Home / Self Care | Attending: Emergency Medicine

## 2012-06-18 ENCOUNTER — Encounter (HOSPITAL_COMMUNITY): Payer: Self-pay | Admitting: *Deleted

## 2012-06-18 ENCOUNTER — Emergency Department (HOSPITAL_COMMUNITY): Payer: Medicare Other

## 2012-06-18 ENCOUNTER — Observation Stay (HOSPITAL_COMMUNITY): Payer: Medicare Other | Admitting: Certified Registered Nurse Anesthetist

## 2012-06-18 ENCOUNTER — Ambulatory Visit (HOSPITAL_COMMUNITY)
Admission: EM | Admit: 2012-06-18 | Discharge: 2012-06-18 | Disposition: A | Discharge status: Home / Self Care | Payer: Medicare Other | Attending: Emergency Medicine | Admitting: Emergency Medicine

## 2012-06-18 DIAGNOSIS — S68019A Complete traumatic metacarpophalangeal amputation of unspecified thumb, initial encounter: Secondary | ICD-10-CM | POA: Insufficient documentation

## 2012-06-18 DIAGNOSIS — S62639B Displaced fracture of distal phalanx of unspecified finger, initial encounter for open fracture: Secondary | ICD-10-CM | POA: Insufficient documentation

## 2012-06-18 DIAGNOSIS — W298XXA Contact with other powered powered hand tools and household machinery, initial encounter: Secondary | ICD-10-CM | POA: Insufficient documentation

## 2012-06-18 DIAGNOSIS — S68012A Complete traumatic metacarpophalangeal amputation of left thumb, initial encounter: Secondary | ICD-10-CM

## 2012-06-18 DIAGNOSIS — Y92009 Unspecified place in unspecified non-institutional (private) residence as the place of occurrence of the external cause: Secondary | ICD-10-CM | POA: Insufficient documentation

## 2012-06-18 DIAGNOSIS — I2510 Atherosclerotic heart disease of native coronary artery without angina pectoris: Secondary | ICD-10-CM | POA: Insufficient documentation

## 2012-06-18 DIAGNOSIS — I10 Essential (primary) hypertension: Secondary | ICD-10-CM | POA: Insufficient documentation

## 2012-06-18 HISTORY — PX: I & D EXTREMITY: SHX5045

## 2012-06-18 LAB — CBC
HCT: 42 % (ref 39.0–52.0)
Hemoglobin: 14.8 g/dL (ref 13.0–17.0)
MCHC: 35.2 g/dL (ref 30.0–36.0)

## 2012-06-18 LAB — BASIC METABOLIC PANEL
BUN: 18 mg/dL (ref 6–23)
Chloride: 106 mEq/L (ref 96–112)
GFR calc non Af Amer: 71 mL/min — ABNORMAL LOW (ref >90)
Glucose, Bld: 159 mg/dL — ABNORMAL HIGH (ref 70–99)
Potassium: 3.9 mEq/L (ref 3.5–5.1)

## 2012-06-18 SURGERY — IRRIGATION AND DEBRIDEMENT EXTREMITY
Anesthesia: General | Site: Thumb | Laterality: Left | Wound class: Clean

## 2012-06-18 MED ORDER — OXYCODONE-ACETAMINOPHEN 5-325 MG PO TABS: 1.0000 | ORAL_TABLET | ORAL | Status: AC | PRN

## 2012-06-18 MED ORDER — EPHEDRINE SULFATE 50 MG/ML IJ SOLN
INTRAMUSCULAR | Status: DC | PRN
  Administered 2012-06-18 (×3): 10 mg via INTRAVENOUS
  Administered 2012-06-18: 15 mg via INTRAVENOUS

## 2012-06-18 MED ORDER — ONDANSETRON HCL 4 MG/2ML IJ SOLN
4.0000 mg | Freq: Once | INTRAMUSCULAR | Status: AC
  Administered 2012-06-18: 4 mg via INTRAVENOUS
  Filled 2012-06-18: qty 2

## 2012-06-18 MED ORDER — MIDAZOLAM HCL 5 MG/5ML IJ SOLN
INTRAMUSCULAR | Status: DC | PRN
  Administered 2012-06-18: 2 mg via INTRAVENOUS

## 2012-06-18 MED ORDER — HYDROMORPHONE HCL PF 1 MG/ML IJ SOLN
1.0000 mg | Freq: Once | INTRAMUSCULAR | Status: AC
  Administered 2012-06-18: 1 mg via INTRAVENOUS

## 2012-06-18 MED ORDER — PROPOFOL 10 MG/ML IV BOLUS
INTRAVENOUS | Status: DC | PRN
  Administered 2012-06-18: 180 mg via INTRAVENOUS

## 2012-06-18 MED ORDER — HYDROMORPHONE HCL PF 1 MG/ML IJ SOLN
INTRAMUSCULAR | Status: AC
  Filled 2012-06-18: qty 1

## 2012-06-18 MED ORDER — DOCUSATE SODIUM 100 MG PO CAPS: 100.0000 mg | ORAL_CAPSULE | Freq: Two times a day (BID) | ORAL | Status: AC

## 2012-06-18 MED ORDER — LIDOCAINE HCL (CARDIAC) 20 MG/ML IV SOLN
INTRAVENOUS | Status: DC | PRN
  Administered 2012-06-18: 70 mg via INTRAVENOUS

## 2012-06-18 MED ORDER — BUPIVACAINE HCL (PF) 0.25 % IJ SOLN
INTRAMUSCULAR | Status: DC | PRN
  Administered 2012-06-18: 6 mL

## 2012-06-18 MED ORDER — SODIUM CHLORIDE 0.9 % IR SOLN
Status: DC | PRN
  Administered 2012-06-18: 1

## 2012-06-18 MED ORDER — ONDANSETRON HCL 4 MG/2ML IJ SOLN: 4.0000 mg | Freq: Four times a day (QID) | INTRAMUSCULAR | Status: DC | PRN

## 2012-06-18 MED ORDER — LACTATED RINGERS IV SOLN
INTRAVENOUS | Status: DC | PRN
  Administered 2012-06-18 (×2): via INTRAVENOUS

## 2012-06-18 MED ORDER — CEFAZOLIN SODIUM 1-5 GM-% IV SOLN
1.0000 g | Freq: Once | INTRAVENOUS | Status: AC
  Administered 2012-06-18 (×2): 1 g via INTRAVENOUS
  Filled 2012-06-18: qty 50

## 2012-06-18 MED ORDER — CEPHALEXIN 500 MG PO CAPS: 500.0000 mg | ORAL_CAPSULE | Freq: Four times a day (QID) | ORAL | Status: AC

## 2012-06-18 MED ORDER — HYDROMORPHONE HCL PF 1 MG/ML IJ SOLN
1.0000 mg | Freq: Once | INTRAMUSCULAR | Status: AC
  Administered 2012-06-18: 1 mg via INTRAVENOUS
  Filled 2012-06-18: qty 1

## 2012-06-18 MED ORDER — HYDROMORPHONE HCL PF 1 MG/ML IJ SOLN: 0.2500 mg | INTRAMUSCULAR | Status: DC | PRN

## 2012-06-18 SURGICAL SUPPLY — 58 items
BANDAGE CONFORM 2  STR LF (GAUZE/BANDAGES/DRESSINGS) IMPLANT
BANDAGE ELASTIC 3 VELCRO ST LF (GAUZE/BANDAGES/DRESSINGS) ×2 IMPLANT
BANDAGE ELASTIC 4 VELCRO ST LF (GAUZE/BANDAGES/DRESSINGS) ×1 IMPLANT
BANDAGE GAUZE ELAST BULKY 4 IN (GAUZE/BANDAGES/DRESSINGS) ×2 IMPLANT
BNDG CMPR 9X4 STRL LF SNTH (GAUZE/BANDAGES/DRESSINGS) ×1
BNDG COHESIVE 1X5 TAN STRL LF (GAUZE/BANDAGES/DRESSINGS) ×1 IMPLANT
BNDG ESMARK 4X9 LF (GAUZE/BANDAGES/DRESSINGS) ×2 IMPLANT
CLOTH BEACON ORANGE TIMEOUT ST (SAFETY) ×2 IMPLANT
CORDS BIPOLAR (ELECTRODE) ×2 IMPLANT
COVER SURGICAL LIGHT HANDLE (MISCELLANEOUS) ×2 IMPLANT
CUFF TOURNIQUET SINGLE 18IN (TOURNIQUET CUFF) ×2 IMPLANT
CUFF TOURNIQUET SINGLE 24IN (TOURNIQUET CUFF) IMPLANT
DRAIN PENROSE 1/4X12 LTX STRL (WOUND CARE) IMPLANT
DRAPE SURG 17X23 STRL (DRAPES) ×2 IMPLANT
DRSG ADAPTIC 3X8 NADH LF (GAUZE/BANDAGES/DRESSINGS) ×2 IMPLANT
DRSG EMULSION OIL 3X3 NADH (GAUZE/BANDAGES/DRESSINGS) ×1 IMPLANT
DRSG TUBE GAUZE 1X5YD SZ2 (GAUZE/BANDAGES/DRESSINGS) ×1 IMPLANT
DRSG TUBE GAUZE 5/8 (GAUZE/BANDAGES/DRESSINGS) ×2 IMPLANT
ELECT REM PT RETURN 9FT ADLT (ELECTROSURGICAL)
ELECTRODE REM PT RTRN 9FT ADLT (ELECTROSURGICAL) IMPLANT
GAUZE SPONGE 2X2 8PLY STRL LF (GAUZE/BANDAGES/DRESSINGS) IMPLANT
GAUZE XEROFORM 1X8 LF (GAUZE/BANDAGES/DRESSINGS) ×2 IMPLANT
GAUZE XEROFORM 5X9 LF (GAUZE/BANDAGES/DRESSINGS) IMPLANT
GLOVE BIOGEL PI IND STRL 8.5 (GLOVE) ×1 IMPLANT
GLOVE BIOGEL PI INDICATOR 8.5 (GLOVE) ×1
GLOVE SURG ORTHO 8.0 STRL STRW (GLOVE) ×2 IMPLANT
GOWN PREVENTION PLUS XLARGE (GOWN DISPOSABLE) ×2 IMPLANT
GOWN STRL NON-REIN LRG LVL3 (GOWN DISPOSABLE) ×6 IMPLANT
HANDPIECE INTERPULSE COAX TIP (DISPOSABLE)
KIT BASIN OR (CUSTOM PROCEDURE TRAY) ×2 IMPLANT
KIT ROOM TURNOVER OR (KITS) ×2 IMPLANT
MANIFOLD NEPTUNE II (INSTRUMENTS) ×2 IMPLANT
NDL HYPO 25GX1X1/2 BEV (NEEDLE) IMPLANT
NEEDLE HYPO 25GX1X1/2 BEV (NEEDLE) IMPLANT
NS IRRIG 1000ML POUR BTL (IV SOLUTION) ×2 IMPLANT
PACK ORTHO EXTREMITY (CUSTOM PROCEDURE TRAY) ×2 IMPLANT
PAD ARMBOARD 7.5X6 YLW CONV (MISCELLANEOUS) ×4 IMPLANT
PAD CAST 4YDX4 CTTN HI CHSV (CAST SUPPLIES) ×1 IMPLANT
PADDING CAST COTTON 4X4 STRL (CAST SUPPLIES) ×2
SET HNDPC FAN SPRY TIP SCT (DISPOSABLE) IMPLANT
SOAP 2 % CHG 4 OZ (WOUND CARE) ×2 IMPLANT
SPONGE GAUZE 2X2 STER 10/PKG (GAUZE/BANDAGES/DRESSINGS) ×1
SPONGE GAUZE 4X4 12PLY (GAUZE/BANDAGES/DRESSINGS) ×1 IMPLANT
SPONGE LAP 18X18 X RAY DECT (DISPOSABLE) ×2 IMPLANT
SPONGE LAP 4X18 X RAY DECT (DISPOSABLE) ×2 IMPLANT
SUCTION FRAZIER TIP 10 FR DISP (SUCTIONS) ×2 IMPLANT
SUT ETHILON 4 0 PS 2 18 (SUTURE) IMPLANT
SUT ETHILON 5 0 P 3 18 (SUTURE)
SUT NYLON ETHILON 5-0 P-3 1X18 (SUTURE) ×1 IMPLANT
SUT PROLENE 4 0 PS 2 18 (SUTURE) ×3 IMPLANT
SYR CONTROL 10ML LL (SYRINGE) IMPLANT
TOWEL OR 17X24 6PK STRL BLUE (TOWEL DISPOSABLE) ×2 IMPLANT
TOWEL OR 17X26 10 PK STRL BLUE (TOWEL DISPOSABLE) ×2 IMPLANT
TUBE ANAEROBIC SPECIMEN COL (MISCELLANEOUS) IMPLANT
TUBE CONNECTING 12X1/4 (SUCTIONS) ×2 IMPLANT
UNDERPAD 30X30 INCONTINENT (UNDERPADS AND DIAPERS) ×2 IMPLANT
WATER STERILE IRR 1000ML POUR (IV SOLUTION) ×2 IMPLANT
YANKAUER SUCT BULB TIP NO VENT (SUCTIONS) ×2 IMPLANT

## 2012-06-18 NOTE — ED Provider Notes (Signed)
History     CSN: [id number]  Arrival date & time 06/18/12  8295   None     Chief Complaint  Patient presents with  . Extremity Laceration    (Consider location/radiation/quality/duration/timing/severity/associated sxs/prior treatment) Patient is a 76 y.o. male presenting with hand pain. The history is provided by the patient and the spouse. No language interpreter was used.  Hand Pain This is a new problem. The current episode started today. The problem occurs constantly. The problem has been unchanged. Pertinent negatives include no abdominal pain, chest pain, fever, nausea, vomiting or weakness. The symptoms are aggravated by bending.  Hand Pain  The incident occurred less than 1 hour ago. Pertinent negatives include no chest pain. The symptoms are aggravated by bending.   76 year old male here today with a left thumb injury. Patient was building bird Houses with his table saw and cut the ulnar aspect of the L  Thumb off in a verticle fashion.  . The left thumb nail and distal finger is amputated.  Bleeding controlled.  + flexion at the DIP joint.  Good sensation The area measures 2.5 x 1cm.    Past Medical History  Diagnosis Date  . Coronary artery disease   . Hypertension     Past Surgical History  Procedure Date  . Coronary artery bypass graft   . Stents   . Back surgery     No family history on file.  History  Substance Use Topics  . Smoking status: Never Smoker   . Smokeless tobacco: Not on file  . Alcohol Use: Yes      Review of Systems  Constitutional: Negative.  Negative for fever.  HENT: Negative.   Eyes: Negative.   Respiratory: Negative.   Cardiovascular: Negative.  Negative for chest pain.  Gastrointestinal: Negative.  Negative for nausea, vomiting and abdominal pain.  Musculoskeletal:       L thumb amputation  Neurological: Negative.  Negative for weakness.  Psychiatric/Behavioral: Negative.   All other systems reviewed and are  negative.    Allergies  Cozaar and Plavix  Home Medications   Current Outpatient Rx  Name Route Sig Dispense Refill  . AMLODIPINE BESYLATE 2.5 MG PO TABS Oral Take 2.5 mg by mouth daily.    . ASPIRIN EC 81 MG PO TBEC Oral Take 81 mg by mouth daily.    . ATENOLOL 25 MG PO TABS Oral Take 25 mg by mouth daily.    . OMEGA-3 FATTY ACIDS 1000 MG PO CAPS Oral Take 1 g by mouth daily.    Marland Kitchen SIMVASTATIN 20 MG PO TABS Oral Take 20 mg by mouth every evening.      BP 174/80  Pulse 63  Temp 98.2 F (36.8 C) (Oral)  Resp 16  SpO2 94%  Physical Exam  Nursing note and vitals reviewed. Constitutional: He is oriented to person, place, and time. He appears well-developed and well-nourished.  HENT:  Head: Normocephalic.  Eyes: Conjunctivae and EOM are normal. Pupils are equal, round, and reactive to light.  Neck: Normal range of motion. Neck supple.  Cardiovascular: Normal rate.   Pulmonary/Chest: Effort normal.  Abdominal: Soft.  Musculoskeletal: He exhibits tenderness. He exhibits no edema.       L thumb amputation to the ulnar aspect verticle.  Nail bed missing.  + dip flexion with + sensation  Neurological: He is alert and oriented to person, place, and time.  Skin: Skin is warm and dry.  Psychiatric: He has a normal mood and  affect.    ED Course  Procedures (including critical care time)   Labs Reviewed  CBC  BASIC METABOLIC PANEL   Dg Finger Thumb Left  06/18/2012  *RADIOLOGY REPORT*  Clinical Data: Distal left thumb laceration with a saw  LEFT THUMB 2+V  Comparison: None  Findings: Comminuted fracture at distal aspect of distal phalanx left thumb. Missing bone fragments and soft tissue defect compatible with partial amputation. No extension of fracture planes to the IP joint. Joint spaces appear preserved. Proximal phalanx and first metacarpal appear intact.  IMPRESSION: Comminuted fracture and partial amputation at distal phalanx left thumb.  Original Report Authenticated By:  Lollie Marrow, M.D.     No diagnosis found.    MDM  Patient will go to the OR with Dr. Leim Fabry for accidental L thumb amputation with a table saw PTA.  Ancef 1 gm in the ER.  Pain controlled with dialuadid.  + comminuted fx to the distal L thumb.

## 2012-06-18 NOTE — ED Notes (Signed)
Surgery waiting.

## 2012-06-18 NOTE — ED Provider Notes (Signed)
History     CSN: [id number]  Arrival date & time 06/18/12  8295   First MD Initiated Contact with Patient 06/18/12 1035      Chief Complaint  Patient presents with  . Extremity Laceration    (Consider location/radiation/quality/duration/timing/severity/associated sxs/prior treatment) Patient is a 76 y.o. male presenting with skin laceration. The history is provided by the patient and the spouse. No language interpreter was used.  Laceration  The incident occurred 1 to 2 hours ago. The laceration is located on the left hand. The laceration is 6 cm in size. Injury mechanism: table saw. The pain is at a severity of 3/10. The pain is mild. The pain has been constant since onset. He reports no foreign bodies present. His tetanus status is out of date.  76 yo male with c/o laceration to L thumb from table saw.  Patient was building bird houses in his back yard with the incident occurred. Bleeding controlled.  Appears to be missing the distal tip of the thumb with exposed bone.  The nailbed is gone. + sensation and flexation to DIP joint.  pmh [facility], hypertension and back surgery.  Wife at bedside.    Past Medical History  Diagnosis Date  . Coronary artery disease   . Hypertension     Past Surgical History  Procedure Date  . Coronary artery bypass graft   . Stents   . Back surgery     No family history on file.  History  Substance Use Topics  . Smoking status: Never Smoker   . Smokeless tobacco: Not on file  . Alcohol Use: Yes      Review of Systems  Constitutional: Negative.  Negative for fever and chills.  HENT: Negative.   Eyes: Negative.   Respiratory: Negative.   Cardiovascular: Negative.   Gastrointestinal: Negative.   Skin:       Laceration to distal L thumb  Neurological: Negative.   Psychiatric/Behavioral: Negative.   All other systems reviewed and are negative.    Allergies  Cozaar and Plavix  Home Medications   Current Outpatient Rx  Name Route Sig  Dispense Refill  . AMLODIPINE BESYLATE 2.5 MG PO TABS Oral Take 2.5 mg by mouth daily.    . ASPIRIN EC 81 MG PO TBEC Oral Take 81 mg by mouth daily.    . ATENOLOL 25 MG PO TABS Oral Take 25 mg by mouth daily.    . OMEGA-3 FATTY ACIDS 1000 MG PO CAPS Oral Take 1 g by mouth daily.    Marland Kitchen SIMVASTATIN 20 MG PO TABS Oral Take 20 mg by mouth every evening.      BP 149/68  Pulse 50  Temp 98.2 F (36.8 C) (Oral)  Resp 14  SpO2 97%  Physical Exam  Nursing note and vitals reviewed. Constitutional: He is oriented to person, place, and time. He appears well-developed and well-nourished.  HENT:  Head: Normocephalic.  Eyes: Conjunctivae and EOM are normal. Pupils are equal, round, and reactive to light.  Neck: Normal range of motion. Neck supple.  Cardiovascular: Normal rate.   Pulmonary/Chest: Effort normal.  Abdominal: Soft.  Musculoskeletal: Normal range of motion.  Neurological: He is alert and oriented to person, place, and time.  Skin: Skin is warm and dry.       Distal L thumb table saw injury Ulnar aspect no nail bed +cms   Psychiatric: He has a normal mood and affect.    ED Course  Procedures (including critical care time)  Labs Reviewed  CBC - Abnormal; Notable for the following:    Platelets 141 (*)     All other components within normal limits  BASIC METABOLIC PANEL - Abnormal; Notable for the following:    Glucose, Bld 159 (*)     GFR calc non Af Amer 71 (*)     GFR calc Af Amer 82 (*)     All other components within normal limits   Dg Finger Thumb Left  06/18/2012  *RADIOLOGY REPORT*  Clinical Data: Distal left thumb laceration with a saw  LEFT THUMB 2+V  Comparison: None  Findings: Comminuted fracture at distal aspect of distal phalanx left thumb. Missing bone fragments and soft tissue defect compatible with partial amputation. No extension of fracture planes to the IP joint. Joint spaces appear preserved. Proximal phalanx and first metacarpal appear intact.  IMPRESSION:  Comminuted fracture and partial amputation at distal phalanx left thumb.  Original Report Authenticated By: Lollie Marrow, M.D.     No diagnosis found.    MDM  Patient moved to CDU awaiting surgical procedure per Dr.  Orlan Leavens for Distal L thumb ulnar side injury with a table saw.  Bone exposed.  Nail bed missing.  Tetanus updated.  Bleeding controlled.  Dressing with betadine .  Ancef 1 gm IV preop.    Labs Reviewed  CBC - Abnormal; Notable for the following:    Platelets 141 (*)     All other components within normal limits  BASIC METABOLIC PANEL - Abnormal; Notable for the following:    Glucose, Bld 159 (*)     GFR calc non Af Amer 71 (*)     GFR calc Af Amer 82 (*)     All other components within normal limits          Remi Haggard, NP 06/18/12 1506  Remi Haggard, NP 06/19/12 1354

## 2012-06-18 NOTE — Anesthesia Preprocedure Evaluation (Addendum)
Anesthesia Evaluation  Patient identified by MRN, date of birth, ID band Patient awake    Reviewed: Allergy & Precautions, H&P , NPO status , Patient's Chart, lab work & pertinent test results  Airway Mallampati: II  Neck ROM: full    Dental  (+) Dental Advisory Given   Pulmonary          Cardiovascular hypertension, Pt. on medications and Pt. on home beta blockers + CAD and + CABG     Neuro/Psych    GI/Hepatic   Endo/Other    Renal/GU      Musculoskeletal   Abdominal   Peds  Hematology   Anesthesia Other Findings   Reproductive/Obstetrics                          Anesthesia Physical Anesthesia Plan  ASA: III  Anesthesia Plan: General   Post-op Pain Management:    Induction: Intravenous  Airway Management Planned: LMA  Additional Equipment:   Intra-op Plan:   Post-operative Plan:   Informed Consent: I have reviewed the patients History and Physical, chart, labs and discussed the procedure including the risks, benefits and alternatives for the proposed anesthesia with the patient or authorized representative who has indicated his/her understanding and acceptance.     Plan Discussed with: CRNA, Surgeon and Anesthesiologist  Anesthesia Plan Comments:        Anesthesia Quick Evaluation

## 2012-06-18 NOTE — Preoperative (Signed)
Beta Blockers   Reason not to administer Beta Blockers:Pt took beta blocker this AM 

## 2012-06-18 NOTE — ED Notes (Signed)
Wife--[phone]

## 2012-06-18 NOTE — ED Notes (Signed)
Called Patient's doctor- [phone]. Last Tetanus shot was July of 2011.

## 2012-06-18 NOTE — Transfer of Care (Signed)
Immediate Anesthesia Transfer of Care Note  Patient: Jose [name]  Procedure(s) Performed: Procedure(s) (LRB): IRRIGATION AND DEBRIDEMENT EXTREMITY (Left)  Patient Location: PACU  Anesthesia Type: General  Level of Consciousness: awake, alert , oriented and patient cooperative  Airway & Oxygen Therapy: Patient Spontanous Breathing and Patient connected to nasal cannula oxygen  Post-op Assessment: Report given to PACU RN, Post -op Vital signs reviewed and stable and Patient moving all extremities X 4  Post vital signs: Reviewed and stable  Complications: No apparent anesthesia complications

## 2012-06-18 NOTE — ED Notes (Signed)
Patient cut his his left thumb on a table saw this morning.

## 2012-06-18 NOTE — Anesthesia Postprocedure Evaluation (Signed)
Anesthesia Post Note  Patient: Jose [name]  Procedure(s) Performed: Procedure(s) (LRB): IRRIGATION AND DEBRIDEMENT EXTREMITY (Left)  Anesthesia type: General  Patient location: PACU  Post pain: Pain level controlled and Adequate analgesia  Post assessment: Post-op Vital signs reviewed, Patient's Cardiovascular Status Stable, Respiratory Function Stable, Patent Airway and Pain level controlled  Last Vitals:  Filed Vitals:   06/18/12 2045  BP: 155/67  Pulse:   Temp:   Resp:     Post vital signs: Reviewed and stable  Level of consciousness: awake, alert  and oriented  Complications: No apparent anesthesia complications

## 2012-06-18 NOTE — Brief Op Note (Signed)
06/18/2012  4:27 PM  PATIENT:  Jose [name]  76 y.o. male  PRE-OPERATIVE DIAGNOSIS:  l thumb TABLE SAW INJURY WITH OPEN DISTAL PHALANX FRACTURE  POST-OPERATIVE DIAGNOSIS: SAME  PROCEDURE:  Procedure(s) (LRB): IRRIGATION AND DEBRIDEMENT EXTREMITY (Left)  SURGEON:  Surgeon(s) and Role:    * Sharma Covert, MD - Primary  PHYSICIAN ASSISTANT: NONE  ASSISTANTS: None  ANESTHESIA:  GENERAL  EBL:  MINIMAL  BLOOD ADMINISTERED:none  DRAINS: none   LOCAL MEDICATIONS USED:  MARCAINE     SPECIMEN:  NONE  DISPOSITION OF SPECIMEN:  N/A  COUNTS:  YES  TOURNIQUET:   <30 min  DICTATION: .161096  PLAN OF CARE: Discharge to home after PACU  PATIENT DISPOSITION:  PACU - hemodynamically stable.   Delay start of Pharmacological VTE agent (>24hrs) due to surgical blood loss or risk of bleeding: NO

## 2012-06-18 NOTE — Progress Notes (Signed)
Observation review is complete. 

## 2012-06-18 NOTE — ED Notes (Signed)
Patient is waiting for the hand surgeon to come and examine him.

## 2012-06-18 NOTE — ED Notes (Addendum)
Patient states he is in no pain. Wife at bedside. Thumb is wrapped and immobile.

## 2012-06-18 NOTE — ED Notes (Signed)
Patient transported to surgery. 

## 2012-06-18 NOTE — ED Notes (Signed)
Report called to Jenny in OR. 

## 2012-06-18 NOTE — ED Notes (Signed)
Patient arrived to CDU with dressing intact to left hand.  Rates pain as 2/10.  Awaiting hand surgeon, family at bedside.

## 2012-06-18 NOTE — H&P (Signed)
Jose [name] is an 76 y.o. male.   Chief Complaint: TABLE SAW TO LEFT THUMB HPI: PT WORKING ON SAW SUSTAINED OPEN INJURY TO LEFT THUMB PRESENTED TO ED WITH OPEN WOUND TO THUMB NO PRIOR HISTORY OF INJURY TO THUMB TD STATUS VERIFIED IN ED NPO AT 8 AM BREAKFAST  Past Medical History  Diagnosis Date  . Coronary artery disease   . Hypertension     Past Surgical History  Procedure Date  . Coronary artery bypass graft   . Stents   . Back surgery     No family history on file. Social History:  reports that he has never smoked. He does not have any smokeless tobacco history on file. He reports that he drinks alcohol. He reports that he does not use illicit drugs.  Allergies:  Allergies  Allergen Reactions  . Cozaar (Losartan) Other (See Comments)    Reaction unknown  . Plavix (Clopidogrel) Other (See Comments)    Reaction unknown     (Not in a hospital admission)  Results for orders placed during the hospital encounter of 06/18/12 (from the past 48 hour(s))  CBC     Status: Abnormal   Collection Time   06/18/12 10:29 AM      Component Value Range Comment   WBC 6.1  4.0 - 10.5 K/uL    RBC 4.54  4.22 - 5.81 MIL/uL    Hemoglobin 14.8  13.0 - 17.0 g/dL    HCT 16.1  09.6 - 04.5 %    MCV 92.5  78.0 - 100.0 fL    MCH 32.6  26.0 - 34.0 pg    MCHC 35.2  30.0 - 36.0 g/dL    RDW 40.9  81.1 - 91.4 %    Platelets 141 (*) 150 - 400 K/uL   BASIC METABOLIC PANEL     Status: Abnormal   Collection Time   06/18/12 10:29 AM      Component Value Range Comment   Sodium 140  135 - 145 mEq/L    Potassium 3.9  3.5 - 5.1 mEq/L    Chloride 106  96 - 112 mEq/L    CO2 25  19 - 32 mEq/L    Glucose, Bld 159 (*) 70 - 99 mg/dL    BUN 18  6 - 23 mg/dL    Creatinine, Ser 7.82  0.50 - 1.35 mg/dL    Calcium 9.2  8.4 - 95.6 mg/dL    GFR calc non Af Amer 71 (*) >90 mL/min    GFR calc Af Amer 82 (*) >90 mL/min    Dg Finger Thumb Left  06/18/2012  *RADIOLOGY REPORT*  Clinical Data: Distal left thumb  laceration with a saw  LEFT THUMB 2+V  Comparison: None  Findings: Comminuted fracture at distal aspect of distal phalanx left thumb. Missing bone fragments and soft tissue defect compatible with partial amputation. No extension of fracture planes to the IP joint. Joint spaces appear preserved. Proximal phalanx and first metacarpal appear intact.  IMPRESSION: Comminuted fracture and partial amputation at distal phalanx left thumb.  Original Report Authenticated By: Lollie Marrow, M.D.    NO RECENT ILLNESSES OR HOSPITALIZATIONS  Blood pressure 150/70, pulse 54, temperature 98.2 F (36.8 C), temperature source Oral, resp. rate 20, SpO2 98.00%. General Appearance:  Alert, cooperative, no distress, appears stated age  Head:  Normocephalic, without obvious abnormality, atraumatic  Eyes:  Pupils equal, conjunctiva/corneas clear,         Throat: Lips, mucosa, and tongue  normal; teeth and gums normal  Neck: No visible masses     Lungs:   respirations unlabored  Chest Wall:  No tenderness or deformity  Heart:  Regular rate and rhythm,  Abdomen:   Soft, non-tender,         Extremities: Left thumb left in bandage. Wiggles thumb Able to flex thumb ip joint No injury to index/long/ring small Fingers warm well perfused Good wrist and digital mobility  Pulses: 2+ and symmetric  Skin: Skin color, texture, turgor normal, no rashes or lesions     Neurologic: Normal    Assessment/Plan Left thumb table saw injury with open distal phalanx fracture  Left thumb open debridement and repair as indicated   R/B/A DISCUSSED WITH PT IN ED.  PT VOICED UNDERSTANDING OF PLAN CONSENT SIGNED DAY OF SURGERY PT SEEN AND EXAMINED PRIOR TO OPERATIVE PROCEDURE/DAY OF SURGERY SITE MARKED. QUESTIONS ANSWERED WILL GO [facility] FOLLOWING SURGERY  Sharma Covert 06/18/2012, 2:10 PM

## 2012-06-18 NOTE — ED Provider Notes (Addendum)
76 year old male cut his left thumb on a table saw. His nitroglycerin his last tetanus immunization was. He has a skin defect on the ulnar aspect of the left thumb that does include the nail and measures and now 2.5 x 1 cm. It does appear that some bone is visible in the defect. A significant portion of the nailbed has been lost. X-ray does show comminuted fracture underlying this. He will be given a dose of Ancef. His PCP is being contacted regarding his tetanus status and that will be updated if needed. Hand surgery will be consulted for management of the skin avulsion and open fracture.  Dione Booze, MD 06/18/12 1040   Date: 06/18/2012  Rate: 53  Rhythm: sinus bradycardia  QRS Axis: right  Intervals: normal  ST/T Wave abnormalities: T wave inversions in the inferior and anterolateral leads  Conduction Disutrbances:none  Narrative Interpretation: Right axis deviation, inverted T waves in inferior and anterolateral leads. When compared with ECG of 05/13/2012, right axis deviation and T wave inversions are new.  Old EKG Reviewed: changes noted    Dione Booze, MD 06/18/12 1218

## 2012-06-19 NOTE — Op Note (Signed)
NAMERUSHAWN, CAPSHAW NO.:  [id number]  MEDICAL RECORD NO.:  [id number]  LOCATION:  [facility]                         FACILITY:  [facility]  PHYSICIAN:  Madelynn Done, MD  DATE OF BIRTH:  01-13-1936  DATE OF PROCEDURE:  06/18/2012 DATE OF DISCHARGE:  06/18/2012                              OPERATIVE REPORT   PREOPERATIVE DIAGNOSIS:  Left thumb table saw injury with open distal phalanx fracture and distal tip amputation.  POSTOPERATIVE DIAGNOSIS:  Left thumb table saw injury with open distal phalanx fracture and distal tip amputation.  ATTENDING PHYSICIAN:  Madelynn Done, MD, who scrubbed and present for the entire procedure.  ASSISTANT SURGEON:  None.  ANESTHESIA:  General via LMA.  SURGICAL PROCEDURE: 1. Left thumb debridement of skin, subcutaneous tissue, and bone     associated with open fracture, left thumb distal phalanx. 2. Left thumb revision, amputation with local advancement flap. 3. Moberg advancement rotational flap.  SURGICAL INDICATIONS:  Mr. Bostic is a 76 year old gentleman who sustained a table saw injury while at home.  The patient was seen and evaluated in the emergency department.  Based on the injury, he was recommended that he undergo the above procedure.  Risks, benefits, and alternatives were discussed in detail with the patient and signed informed consent was obtained.  Risks include, but not limited to bleeding; infection; damage to nearby nerves, arteries, or tendons; loss of motion of the wrist and digits; and need for further surgical intervention.  DESCRIPTION OF PROCEDURE:  The patient was properly identified in the preop holding area and marked with a permanent marker, made on the left thumb.  The patient was then brought back to the operating room, placed supine on the anesthesia room table where general anesthesia was administered.  The patient received preoperative antibiotics prior to any skin incision.  Well-padded  tourniquet was then placed on the left brachium and sealed with 1000-drape.  The left upper extremity was then prepped and draped in normal sterile fashion.  Time-out was called, the correct side was identified, and the procedure was then begun. Attention was then turned to the left thumb.  The patient did have a very comminuted distal phalanx fracture.  Debridement of the skin, subcutaneous tissue, and bone was then carried out.  What will remained of the nail bed was then excised, little remained of the nail bed.  The nail bed was completely excised.  Distal phalanx was taken back to a stable rim.  Excisional debridement was carried out with sharp knives and scissors.  This was taken back.  Once this was carried out, the soft tissue coverage was then carried out.  A Moberg advancement flap was then created elevating the volar soft tissues dorsally.  The wound was then thoroughly irrigated.  Copious wound irrigation done throughout and advancement flap closure was then carried out with a Moberg flap with simple Prolene sutures.  This did close the skin nicely from a volar to dorsal direction.  Adaptic dressing was then applied.  Tourniquet was deflated.  There was good perfusion of thumb tip.  Sterile compressive bandage was then applied.  The patient tolerated the procedure well,  returned to the recovery room in good condition.  POSTOPERATIVE PLAN:  The patient will be discharged to home, seen back in the office in approximately 8-9 days for wound check, tip protector splint, sutures out around the 2-3 weeks mark and then gradual use and activity.     Madelynn Done, MD     FWO/MEDQ  D:  06/18/2012  T:  06/19/2012  Job:  [id number]

## 2012-06-20 ENCOUNTER — Encounter (HOSPITAL_COMMUNITY): Payer: Self-pay | Admitting: Orthopedic Surgery

## 2012-06-20 NOTE — ED Provider Notes (Signed)
Medical screening examination/treatment/procedure(s) were conducted as a shared visit with non-physician practitioner(s) and myself.  I personally evaluated the patient during the encounter   Nereyda Bowler, MD 06/20/12 1433 

## 2012-09-16 ENCOUNTER — Other Ambulatory Visit: Payer: Self-pay

## 2012-10-24 IMAGING — CR DG FINGER THUMB 2+V*L*
4 series · 4 of 4 positions shown · non-contrast
Comparison: None

CLINICAL DATA: Distal left thumb laceration with a saw

LEFT THUMB 2+V

[x finger pa left (1 of 2)]
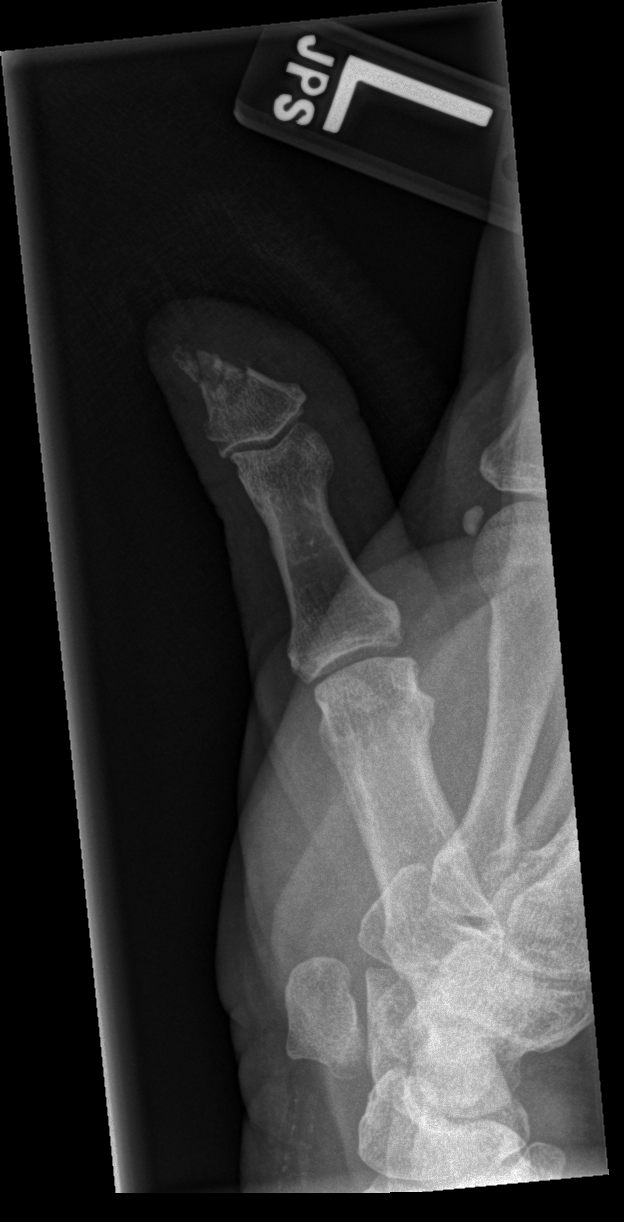

[x finger pa left (2 of 2)]
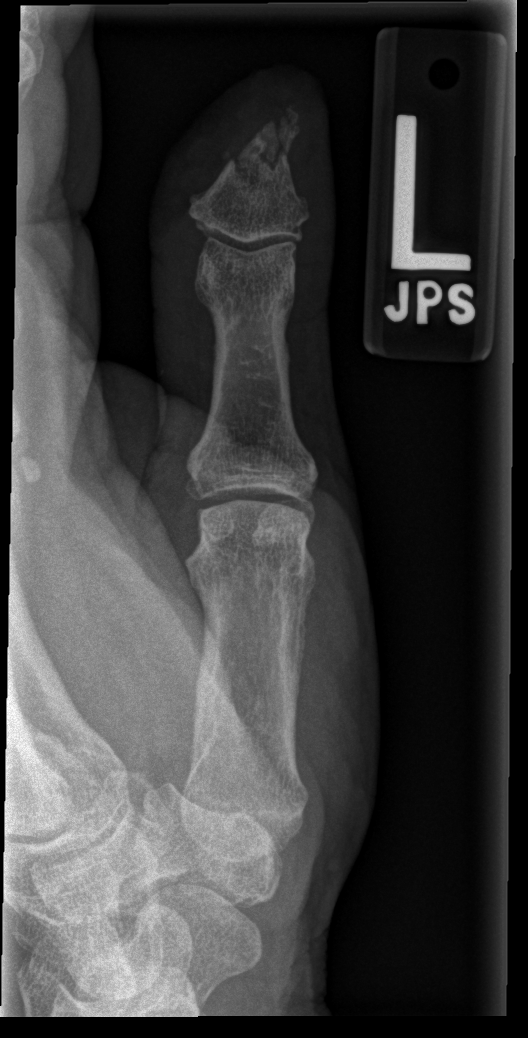

[x finger obl left]
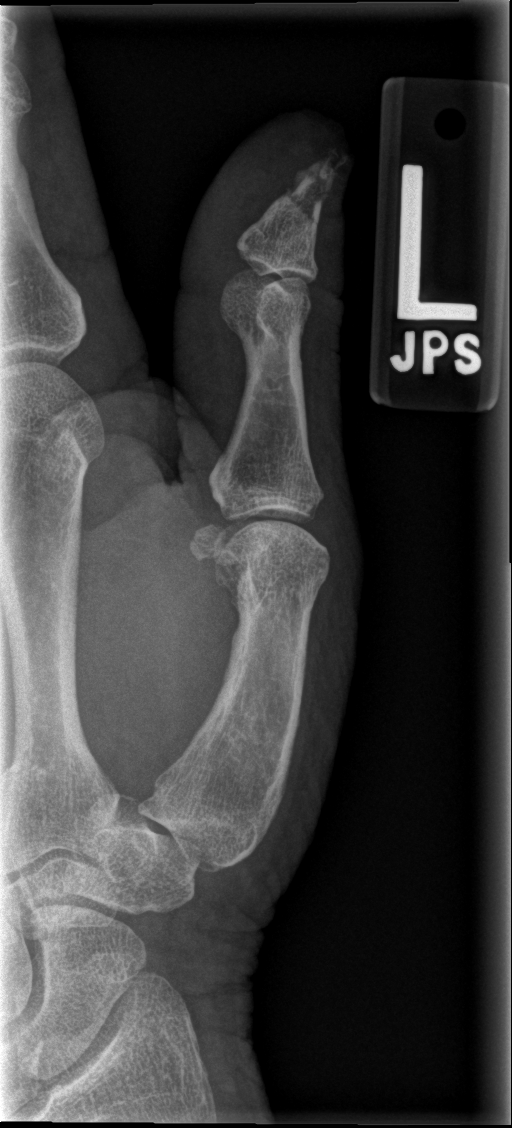

[x finger lat left]
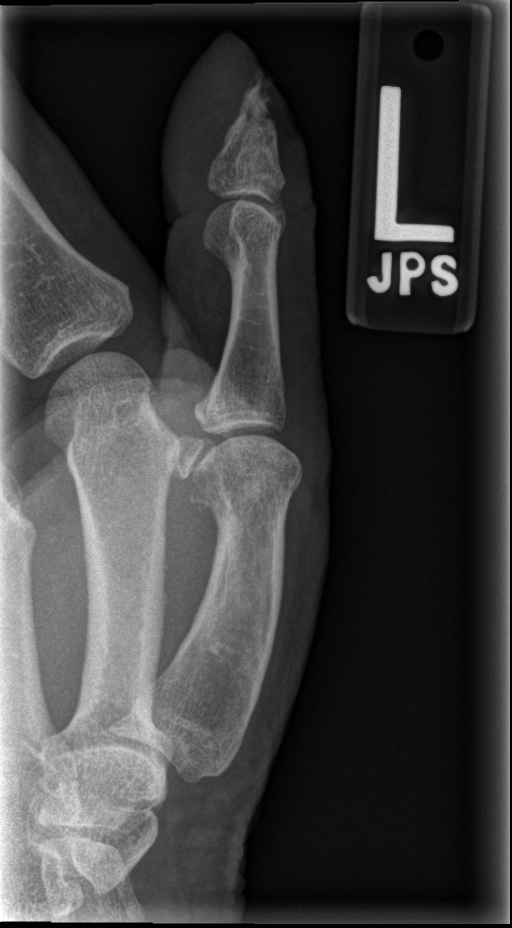

[4 of 4 positions shown; findings below may reference images not displayed]

FINDINGS: Comminuted fracture at distal aspect of distal phalanx left thumb.
Missing bone fragments and soft tissue defect compatible with
partial amputation.
No extension of fracture planes to the IP joint.
Joint spaces appear preserved.
Proximal phalanx and first metacarpal appear intact.
IMPRESSION: Comminuted fracture and partial amputation at distal phalanx left
thumb.

## 2013-07-11 ENCOUNTER — Emergency Department (HOSPITAL_COMMUNITY): Payer: Medicare Other

## 2013-07-11 ENCOUNTER — Emergency Department (HOSPITAL_COMMUNITY)
Admission: EM | Admit: 2013-07-11 | Discharge: 2013-07-11 | Disposition: A | Discharge status: Home / Self Care | Payer: Medicare Other | Attending: Emergency Medicine | Admitting: Emergency Medicine

## 2013-07-11 ENCOUNTER — Encounter (HOSPITAL_COMMUNITY): Payer: Self-pay

## 2013-07-11 DIAGNOSIS — Y939 Activity, unspecified: Secondary | ICD-10-CM | POA: Insufficient documentation

## 2013-07-11 DIAGNOSIS — Y929 Unspecified place or not applicable: Secondary | ICD-10-CM | POA: Insufficient documentation

## 2013-07-11 DIAGNOSIS — IMO0001 Reserved for inherently not codable concepts without codable children: Secondary | ICD-10-CM | POA: Insufficient documentation

## 2013-07-11 DIAGNOSIS — R55 Syncope and collapse: Secondary | ICD-10-CM | POA: Insufficient documentation

## 2013-07-11 DIAGNOSIS — T782XXA Anaphylactic shock, unspecified, initial encounter: Secondary | ICD-10-CM

## 2013-07-11 DIAGNOSIS — I2510 Atherosclerotic heart disease of native coronary artery without angina pectoris: Secondary | ICD-10-CM | POA: Insufficient documentation

## 2013-07-11 DIAGNOSIS — R0602 Shortness of breath: Secondary | ICD-10-CM | POA: Insufficient documentation

## 2013-07-11 DIAGNOSIS — I10 Essential (primary) hypertension: Secondary | ICD-10-CM | POA: Insufficient documentation

## 2013-07-11 DIAGNOSIS — Z79899 Other long term (current) drug therapy: Secondary | ICD-10-CM | POA: Insufficient documentation

## 2013-07-11 DIAGNOSIS — Z7982 Long term (current) use of aspirin: Secondary | ICD-10-CM | POA: Insufficient documentation

## 2013-07-11 LAB — BASIC METABOLIC PANEL
CO2: 26 mEq/L (ref 19–32)
Calcium: 9.3 mg/dL (ref 8.4–10.5)
Creatinine, Ser: 0.95 mg/dL (ref 0.50–1.35)
GFR calc Af Amer: >90 mL/min (ref >90)

## 2013-07-11 LAB — MAGNESIUM: Magnesium: 1.9 mg/dL (ref 1.5–2.5)

## 2013-07-11 LAB — CBC WITH DIFFERENTIAL/PLATELET
Basophils Absolute: 0 10*3/uL (ref 0.0–0.1)
Basophils Relative: 0 % (ref 0–1)
Eosinophils Relative: 1 % (ref 0–5)
HCT: 42.4 % (ref 39.0–52.0)
MCHC: 34.7 g/dL (ref 30.0–36.0)
MCV: 91.4 fL (ref 78.0–100.0)
Monocytes Absolute: 0.4 10*3/uL (ref 0.1–1.0)
RDW: 12.7 % (ref 11.5–15.5)

## 2013-07-11 LAB — POCT I-STAT TROPONIN I

## 2013-07-11 MED ORDER — METHYLPREDNISOLONE SODIUM SUCC 125 MG IJ SOLR
125.0000 mg | Freq: Once | INTRAMUSCULAR | Status: AC
  Administered 2013-07-11: 125 mg via INTRAVENOUS
  Filled 2013-07-11: qty 2

## 2013-07-11 MED ORDER — EPINEPHRINE 0.3 MG/0.3ML IJ SOAJ: 0.3000 mg | INTRAMUSCULAR | Status: DC | PRN

## 2013-07-11 MED ORDER — SODIUM CHLORIDE 0.9 % IV SOLN
40.0000 mg | Freq: Once | INTRAVENOUS | Status: AC
  Administered 2013-07-11: 40 mg via INTRAVENOUS
  Filled 2013-07-11: qty 4

## 2013-07-11 MED ORDER — PREDNISONE 20 MG PO TABS: 60.0000 mg | ORAL_TABLET | Freq: Every day | ORAL | Status: DC

## 2013-07-11 NOTE — ED Notes (Signed)
Pt taken to xray 

## 2013-07-11 NOTE — ED Notes (Signed)
The pt reports that he feels ok just waiting to go home

## 2013-07-11 NOTE — ED Provider Notes (Signed)
TIME SEEN: 12:32 PM  CHIEF COMPLAINT: Shortness of breath, urticaria, syncope after being stung by a wasp  HPI: Patient is a 77 y.o. 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 Patient reports at 11 AM this morning he was stung in the left ankle by a wasp. Shortly after he developed diffuse urticaria and difficulty breathing. His wife urged him to the fire department where he had an episode of syncope and incontinence. There was no seizure-like activity. He was given an EpiPen IM and quickly recovered. He reports that these are similar symptoms to what he has had in the past with his insect stings. He normally developed hives, shortness of breath and then syncope. He denies any chest pain or current shortness of breath. No angioedema. His urticaria have resolved. He states he is in his normal state of health.  ROS: See HPI Constitutional: no fever  Eyes: no drainage  ENT: no runny nose   Cardiovascular:  no chest pain  Resp: Resolved SOB  GI: no vomiting GU: no dysuria Integumentary: no rash  Allergy:+ hives  Musculoskeletal: no leg swelling  Neurological: no slurred speech ROS otherwise negative  PAST MEDICAL HISTORY/PAST SURGICAL HISTORY:  Past Medical History  Diagnosis Date  . Coronary artery disease   . Hypertension     MEDICATIONS:  Prior to Admission medications   Medication Sig Start Date End Date Taking? Authorizing Provider  amLODipine (NORVASC) 2.5 MG tablet Take 2.5 mg by mouth daily.    Historical Provider, MD  aspirin EC 81 MG tablet Take 81 mg by mouth daily.    Historical Provider, MD  atenolol (TENORMIN) 25 MG tablet Take 25 mg by mouth daily.    Historical Provider, MD  fish oil-omega-3 fatty acids 1000 MG capsule Take 1 g by mouth daily.    Historical Provider, MD   simvastatin (ZOCOR) 20 MG tablet Take 20 mg by mouth every evening.    Historical Provider, MD    ALLERGIES:  Allergies  Allergen Reactions  . Yellow Jacket Venom (Bee Venom) Hives, Shortness Of Breath and Rash  . Cozaar (Losartan) Other (See Comments)    Reaction unknown  . Plavix (Clopidogrel) Other (See Comments)    Reaction unknown    SOCIAL HISTORY:  History  Substance Use Topics  . Smoking status: Never Smoker   . Smokeless tobacco: Not on file  . Alcohol Use: Yes    FAMILY HISTORY: No family history on file.  EXAM: BP 140/60  Pulse 60  Temp(Src) 97.9 F (36.6 C) (Oral)  Resp 18  SpO2 97% CONSTITUTIONAL: Alert and oriented and responds appropriately to questions. Well-appearing; well-nourished HEAD: Normocephalic EYES: Conjunctivae clear, PERRL ENT: normal nose; no rhinorrhea; moist mucous membranes; pharynx without lesions noted NECK: Supple, no meningismus, no LAD  CARD: RRR; S1 and S2 appreciated; no murmurs, no clicks, no rubs, no gallops RESP: Normal chest excursion without splinting or tachypnea; breath sounds clear and equal bilaterally; no wheezes, no rhonchi, no rales,  ABD/GI: Normal bowel sounds; non-distended; soft, non-tender, no rebound, no guarding BACK:  The back appears normal and is non-tender to palpation, there is no CVA tenderness EXT: Normal ROM in all joints; non-tender to palpation; no edema; normal capillary refill; no cyanosis    SKIN: Normal color for age and race; warm, no urticaria NEURO: Moves all extremities equally PSYCH:  The patient's mood and manner are appropriate. Grooming and personal hygiene are appropriate.  MEDICAL DECISION MAKING: Patient with hives, shortness of breath and syncope after being stung by wasp today. He did receive epinephrine IM by first responders at approximately 11:30 AM. Patient is now back to baseline and feeling well. Will give Pepcid and Solu-Medrol. Patient states he has had reactions with Benadryl in  the past and therefore we will hold this medicine. Given patient's cardiac history and syncope today, will obtain EKG, chest x-ray, basic labs and 2 sets of cardiac enzymes. My suspicion that this was cardiac in nature is low however he does have risk factors. I feel his syncopal episode was due to an anaphylactic-like reaction. Patient states that the symptoms here today are similar to the symptoms he has had with prior insect stings in the past.   Date: 07/11/2013  Rate: 56  Rhythm: normal sinus rhythm  QRS Axis: normal  Intervals: normal  ST/T Wave abnormalities: T-wave inversions in V5 and V6 which are improved compared to prior EKG in 2013  Conduction Disutrbances: none  Narrative Interpretation: Ischemic changes in the lateral leads that are improved compared to prior EKG, no new ischemic changes     ED PROGRESS:  1:51 PM  Pt is still hemodynamically stable and asymptomatic. First set of cardiac enzymes negative. We'll repeat second set at 4 PM.  4:08 PM  Pt second set of cardiac enzymes are negative. Patient is still hemodynamically stable asymptomatic. We'll discharge him with refill of his EpiPen and close outpatient followup. Given customary in usual return precautions. Patient and wife verbalize understanding and are comfortable with this plan.  Layla Maw Demetria Iwai, DO 07/11/13 1609

## 2013-07-11 NOTE — ED Notes (Signed)
Patient is resting comfortably. 

## 2013-07-11 NOTE — ED Notes (Signed)
Pt here by ems for bee sting, known allergy to yellow jackets, was stung by wasp today, taken to fire station by wife and arrived with wheezing and had syncopal episode. Was given his own epi pen and with ems is alert oriented, hives are dissipatiitng at this time, pt in no apparent distress at this time.

## 2013-10-09 ENCOUNTER — Inpatient Hospital Stay (HOSPITAL_COMMUNITY)
Admission: EM | Admit: 2013-10-09 | Discharge: 2013-10-11 | DRG: 313 | Disposition: A | Discharge status: Home / Self Care | Payer: Medicare Other | Attending: Internal Medicine | Admitting: Internal Medicine

## 2013-10-09 ENCOUNTER — Emergency Department (HOSPITAL_COMMUNITY): Payer: Medicare Other

## 2013-10-09 ENCOUNTER — Encounter (HOSPITAL_COMMUNITY): Payer: Self-pay | Admitting: Emergency Medicine

## 2013-10-09 DIAGNOSIS — I25810 Atherosclerosis of coronary artery bypass graft(s) without angina pectoris: Secondary | ICD-10-CM | POA: Diagnosis present

## 2013-10-09 DIAGNOSIS — R079 Chest pain, unspecified: Secondary | ICD-10-CM

## 2013-10-09 DIAGNOSIS — I2510 Atherosclerotic heart disease of native coronary artery without angina pectoris: Secondary | ICD-10-CM | POA: Diagnosis present

## 2013-10-09 DIAGNOSIS — I252 Old myocardial infarction: Secondary | ICD-10-CM

## 2013-10-09 DIAGNOSIS — I10 Essential (primary) hypertension: Secondary | ICD-10-CM | POA: Diagnosis present

## 2013-10-09 DIAGNOSIS — Z79899 Other long term (current) drug therapy: Secondary | ICD-10-CM

## 2013-10-09 DIAGNOSIS — R0789 Other chest pain: Principal | ICD-10-CM | POA: Diagnosis present

## 2013-10-09 DIAGNOSIS — Z7982 Long term (current) use of aspirin: Secondary | ICD-10-CM

## 2013-10-09 DIAGNOSIS — E785 Hyperlipidemia, unspecified: Secondary | ICD-10-CM | POA: Diagnosis present

## 2013-10-09 DIAGNOSIS — Z951 Presence of aortocoronary bypass graft: Secondary | ICD-10-CM

## 2013-10-09 DIAGNOSIS — Z8679 Personal history of other diseases of the circulatory system: Secondary | ICD-10-CM | POA: Insufficient documentation

## 2013-10-09 HISTORY — DX: Personal history of other medical treatment: Z92.89

## 2013-10-09 HISTORY — DX: Unspecified malignant neoplasm of skin, unspecified: C44.90

## 2013-10-09 HISTORY — DX: Adverse effect of unspecified anesthetic, initial encounter: T41.45XA

## 2013-10-09 HISTORY — DX: Acute myocardial infarction, unspecified: I21.9

## 2013-10-09 HISTORY — DX: Hyperlipidemia, unspecified: E78.5

## 2013-10-09 HISTORY — DX: Gout, unspecified: M10.9

## 2013-10-09 HISTORY — DX: Calculus of kidney: N20.0

## 2013-10-09 HISTORY — DX: Ventricular fibrillation: I49.01

## 2013-10-09 HISTORY — DX: Other complications of anesthesia, initial encounter: T88.59XA

## 2013-10-09 HISTORY — DX: Gastro-esophageal reflux disease without esophagitis: K21.9

## 2013-10-09 LAB — CBC
HCT: 42 % (ref 39.0–52.0)
MCHC: 35.5 g/dL (ref 30.0–36.0)
MCV: 91.7 fL (ref 78.0–100.0)
RBC: 4.58 MIL/uL (ref 4.22–5.81)
RDW: 12.4 % (ref 11.5–15.5)
WBC: 6.8 10*3/uL (ref 4.0–10.5)

## 2013-10-09 LAB — TROPONIN I: Troponin I: <0.3 ng/mL (ref <0.30)

## 2013-10-09 LAB — BASIC METABOLIC PANEL
CO2: 24 mEq/L (ref 19–32)
Chloride: 105 mEq/L (ref 96–112)
Creatinine, Ser: 0.94 mg/dL (ref 0.50–1.35)

## 2013-10-09 MED ORDER — ATENOLOL 12.5 MG HALF TABLET
12.5000 mg | ORAL_TABLET | Freq: Every day | ORAL | Status: DC
  Administered 2013-10-10: 12.5 mg via ORAL
  Filled 2013-10-09: qty 1

## 2013-10-09 MED ORDER — EPINEPHRINE 0.3 MG/0.3ML IJ SOAJ: 0.3000 mg | Freq: Once | INTRAMUSCULAR | Status: AC | PRN

## 2013-10-09 MED ORDER — AMLODIPINE BESYLATE 2.5 MG PO TABS: 2.5000 mg | ORAL_TABLET | Freq: Every day | ORAL | Status: DC

## 2013-10-09 MED ORDER — AMLODIPINE BESYLATE 10 MG PO TABS
10.0000 mg | ORAL_TABLET | Freq: Every day | ORAL | Status: DC
  Administered 2013-10-10 – 2013-10-11 (×2): 10 mg via ORAL
  Filled 2013-10-09 (×2): qty 1

## 2013-10-09 MED ORDER — ONDANSETRON HCL 4 MG/2ML IJ SOLN: 4.0000 mg | Freq: Four times a day (QID) | INTRAMUSCULAR | Status: DC | PRN

## 2013-10-09 MED ORDER — OMEGA-3-ACID ETHYL ESTERS 1 G PO CAPS
1.0000 g | ORAL_CAPSULE | Freq: Every day | ORAL | Status: DC
  Administered 2013-10-10 – 2013-10-11 (×2): 1 g via ORAL
  Filled 2013-10-09 (×2): qty 1

## 2013-10-09 MED ORDER — SIMVASTATIN 20 MG PO TABS
20.0000 mg | ORAL_TABLET | Freq: Every evening | ORAL | Status: DC
  Administered 2013-10-09 – 2013-10-10 (×2): 20 mg via ORAL
  Filled 2013-10-09 (×3): qty 1

## 2013-10-09 MED ORDER — ENOXAPARIN SODIUM 40 MG/0.4ML ~~LOC~~ SOLN
40.0000 mg | SUBCUTANEOUS | Status: DC
  Administered 2013-10-09 – 2013-10-10 (×2): 40 mg via SUBCUTANEOUS
  Filled 2013-10-09 (×3): qty 0.4

## 2013-10-09 MED ORDER — ASPIRIN EC 81 MG PO TBEC
81.0000 mg | DELAYED_RELEASE_TABLET | Freq: Every day | ORAL | Status: DC
  Administered 2013-10-10 – 2013-10-11 (×2): 81 mg via ORAL
  Filled 2013-10-09 (×2): qty 1

## 2013-10-09 MED ORDER — ASPIRIN 325 MG PO TABS
325.0000 mg | ORAL_TABLET | Freq: Once | ORAL | Status: AC
  Administered 2013-10-09: 325 mg via ORAL
  Filled 2013-10-09: qty 1

## 2013-10-09 MED ORDER — ACETAMINOPHEN 325 MG PO TABS: 650.0000 mg | ORAL_TABLET | Freq: Four times a day (QID) | ORAL | Status: DC | PRN

## 2013-10-09 MED ORDER — ACETAMINOPHEN 650 MG RE SUPP: 650.0000 mg | Freq: Four times a day (QID) | RECTAL | Status: DC | PRN

## 2013-10-09 MED ORDER — SODIUM CHLORIDE 0.9 % IJ SOLN
3.0000 mL | Freq: Two times a day (BID) | INTRAMUSCULAR | Status: DC
  Administered 2013-10-09 – 2013-10-10 (×3): 3 mL via INTRAVENOUS

## 2013-10-09 MED ORDER — ONDANSETRON HCL 4 MG PO TABS: 4.0000 mg | ORAL_TABLET | Freq: Four times a day (QID) | ORAL | Status: DC | PRN

## 2013-10-09 MED ORDER — OMEGA-3 FATTY ACIDS 1000 MG PO CAPS: 1.0000 g | ORAL_CAPSULE | Freq: Every day | ORAL | Status: DC

## 2013-10-09 NOTE — Consult Note (Signed)
Reason for Consult: chest pain in pt with hx of 2 MIs 1999 and 2006 and CABG in 2011  Referring Physician:Dr. York Spaniel PCP:  Thayer Headings, MD  Primary Cardiologist:Dr. Hriday Jose [name] is an 77 y.o. male.    Chief Complaint: Chest pressure and dizziness similar to symptoms prior to MI  HPI: 77 y.o. male with PMH of HTN, CAD s/p CABG 2011 with previous inf MI in 1999 and 2006.  In 2006 he had V. Fib arrest with his MI.  Stents have been placed to his RCA and stent to LAD, ultimately undergoing CABG 2011.  He has HPL with intolerance to statins.  Presented with non exertional substernal chest pressure/uncomfortable feeling. He denies chest pain and stated he had never had chest pain with his MIs.  There was no radiation, no associated SOB, no palpitations, no diaphoresis. He had a mild dizziness when bending.  He states these have been his cardiac symptoms in the past.  Currently without complaints.   EKG  Without acute changes. Troponin is negative.  He was admitted to rule out MI.  He has not had any stress testing since CABG.     Past Medical History  Diagnosis Date  . Coronary artery disease     hx CABG 2011  . Hypertension   . Hx of myocardial infarction 1999, 2006    both acute inferior wall  . Hyperlipidemia LDL goal <70   . Hx of ventricular fibrillation 2006    arrest with MI  . GERD (gastroesophageal reflux disease)   . Gout   . H/O cardiovascular stress test 2011      mild to mod ischemia this led to cath and CABG  . Hx of echocardiogram 2010    EF >55%, mild to mod MR, mild aortic reg.     Past Surgical History  Procedure Laterality Date  . Coronary artery bypass graft  2011    LIMA-LAD; seq VG-1st & 2nd OM of LCX; VG-PDA of RCA  . Back surgery    . I&d extremity  06/18/2012    Procedure: IRRIGATION AND DEBRIDEMENT EXTREMITY;  Surgeon: Sharma Covert, MD;  Location: [facility] OR;  Service: Orthopedics;  Laterality: Left;  I & D left thumb   .  Coronary angioplasty with stent placement  1999    Stent to RCA  . Coronary angioplasty with stent placement  2006    stent to distal RCA  . Coronary angioplasty with stent placement  2006    mid LAD with Cypher stent    History reviewed. No pertinent family history. Social History:  reports that he has never smoked. He has never used smokeless tobacco. He reports that he drinks alcohol. He reports that he does not use illicit drugs.  Allergies:  Allergies  Allergen Reactions  . Yellow Jacket Venom [Bee Venom] Hives, Shortness Of Breath and Rash  . Benicar [Olmesartan]     Enlarged breasts  . Cozaar [Losartan] Other (See Comments)    Reaction unknown  . Plavix [Clopidogrel] Other (See Comments)    Reaction unknown  . Relafen [Nabumetone]   . Statins   . Tekturna [Aliskiren]     Medications Prior to Admission  Medication Sig Dispense Refill  . amLODipine (NORVASC) 2.5 MG tablet Take 2.5 mg by mouth daily.      Marland Kitchen aspirin EC 81 MG tablet Take 81 mg by mouth daily.      Marland Kitchen atenolol (TENORMIN) 25  MG tablet Take 12.5 mg by mouth daily.       Marland Kitchen EPINEPHrine (EPIPEN) 0.3 mg/0.3 mL SOAJ injection Inject 0.3 mg into the muscle once as needed (allergic reaction).      . fish oil-omega-3 fatty acids 1000 MG capsule Take 1 g by mouth daily.      . simvastatin (ZOCOR) 20 MG tablet Take 20 mg by mouth every evening.        Results for orders placed during the hospital encounter of 10/09/13 (from the past 48 hour(s))  BASIC METABOLIC PANEL     Status: Abnormal   Collection Time    10/09/13  1:30 PM      Result Value Range   Sodium 140  135 - 145 mEq/L   Potassium 4.2  3.5 - 5.1 mEq/L   Chloride 105  96 - 112 mEq/L   CO2 24  19 - 32 mEq/L   Glucose, Bld 111 (*) 70 - 99 mg/dL   BUN 15  6 - 23 mg/dL   Creatinine, Ser 7.84  0.50 - 1.35 mg/dL   Calcium 9.7  8.4 - 69.6 mg/dL   GFR calc non Af Amer 80 (*) >90 mL/min   GFR calc Af Amer >90  >90 mL/min   Comment: (NOTE)     The eGFR has been  calculated using the CKD EPI equation.     This calculation has not been validated in all clinical situations.     eGFR's persistently <90 mL/min signify possible Chronic Kidney     Disease.  TROPONIN I     Status: None   Collection Time    10/09/13  1:30 PM      Result Value Range   Troponin I <0.30  <0.30 ng/mL   Comment:            Due to the release kinetics of cTnI,     a negative result within the first hours     of the onset of symptoms does not rule out     myocardial infarction with certainty.     If myocardial infarction is still suspected,     repeat the test at appropriate intervals.  CBC     Status: None   Collection Time    10/09/13  1:49 PM      Result Value Range   WBC 6.8  4.0 - 10.5 K/uL   RBC 4.58  4.22 - 5.81 MIL/uL   Hemoglobin 14.9  13.0 - 17.0 g/dL   HCT 29.5  28.4 - 13.2 %   MCV 91.7  78.0 - 100.0 fL   MCH 32.5  26.0 - 34.0 pg   MCHC 35.5  30.0 - 36.0 g/dL   RDW 44.0  10.2 - 72.5 %   Platelets 164  150 - 400 K/uL   Dg Chest 2 View  10/09/2013   CLINICAL DATA:  Chest tightness.  EXAM: CHEST  2 VIEW  COMPARISON:  07/11/2013  FINDINGS: Changes from cardiac surgery are stable. The cardiac silhouette is normal in size and configuration. The mediastinum is normal in contour. No hilar masses.  Clear lungs. No pleural effusion or pneumothorax.  The bony thorax is demineralized but intact.  IMPRESSION: No acute cardiopulmonary disease   Electronically Signed   By: Amie Portland M.D.   On: 10/09/2013 13:46    ROS: General:no colds or fevers, no weight changes Skin:no rashes or ulcers HEENT:no blurred vision, no congestion CV:see HPI PUL:see HPI GI:no diarrhea constipation or  melena, no indigestion GU:no hematuria, no dysuria MS:no joint pain, no claudication, legs somewhat stiff when he begins walking, hx of back surgery Neuro:no syncope, + dizziness/ lightheadedness when bending Endo:no diabetes, no thyroid disease   Blood pressure 182/90, pulse 56,  temperature 97.9 F (36.6 C), temperature source Oral, resp. rate 17, height 5\' 11"  (1.803 m), weight 188 lb (85.276 kg), SpO2 100.00%. PE: General:Pleasant affect, NAD Skin:Warm and dry, brisk capillary refill HEENT:normocephalic, sclera clear, mucus membranes moist Neck:supple, no JVD, no bruits, no adenopathy   Heart:S1S2 RRR without murmur, gallup, rub or click Lungs:clear without rales, rhonchi, or wheezes ZOX:WRUE, non tender, + BS, do not palpate liver spleen or masses Ext:no lower ext edema, 2+ post tib pulses, 2+ radial pulses Neuro:alert and oriented X 3, MAE, follows commands, + facial symmetry    Assessment/Plan Active Problems:   Chest pain   HTN (hypertension)   Coronary artery disease, hx of two inf. MIs one with v. fib arrest, stents to RCA and ultimately in 2011 CABG    Hx of myocardial infarction   Hyperlipidemia LDL goal <70  PLAN: Pt admitted to rule out MI, on VTE proph. Lovenox.  Will increase norvasc.  Outpt stress vs. Inpt.     [facility]  Nurse Practitioner Certified [facility] Pager [phone] 10/09/2013, 4:59 PM    I have seen and examined the patient along with Nada Boozer, NP.  I have reviewed the chart, notes and new data.  I agree with NP's no T-wave changeste.  Key new complaints: He is now chest pain free and the belching has stopped Key examination changes: No clinical signs to suggest acute heart failure and no evidence of arrhythmia Key new findings / data: Electrocardiogram shows nonspecific T-wave changes especially in the lateral leads but does not appear to be meaningfully different from previous tracings; first assay cardiac troponin normal  PLAN: Possible acute coronary syndrome with symptoms similar to previous events in a patient with long-standing complicated coronary history. No retrocardiac graphic or biochemical high-risk features at this point. If ECG and enzymes remain normal, recommend treadmill Myoview  in the morning. If symptoms recur overnight or if he has high risk ECG or enzyme abnormalities proceed to coronary angiography on Monday morning  Thurmon Fair, MD, [facility] [phone] 10/09/2013, 5:29 PM

## 2013-10-09 NOTE — ED Notes (Addendum)
Pt has cardiac hx and reports this feels the same as when he had heart attack in 1999 and has had several since. Bypass surgery in 2011. Reports feeling lightheaded and discomfort in chest. He reports he may feel more stressed than usual.

## 2013-10-09 NOTE — H&P (Signed)
[facility] History and Physical  Jose [name] AVW:[id number] DOB: 04/29/1936 DOA: 10/09/2013  Referring physician:  PCP: Thayer Headings, MD  Specialists:   Chief Complaint: chest pain  HPI: Jose [name] is a 77 y.o. male with PMH of HTN, CAD s/p CABG, HPL presented with non exertional substernal chest pain without radiation, no associated SOB, no palpitations, no diaphoresis. No cough, no fever;   Review of Systems: The patient denies anorexia, fever, weight loss,, vision loss, decreased hearing, hoarseness, syncope, dyspnea on exertion, peripheral edema, balance deficits, hemoptysis, abdominal pain, melena, hematochezia, severe indigestion/heartburn, hematuria, incontinence, genital sores, muscle weakness, suspicious skin lesions, transient blindness, difficulty walking, depression, unusual weight change, abnormal bleeding, enlarged lymph nodes, angioedema, and breast masses.    Past Medical History  Diagnosis Date  . Coronary artery disease   . Hypertension    Past Surgical History  Procedure Laterality Date  . Coronary artery bypass graft    . Stents    . Back surgery    . I&d extremity  06/18/2012    Procedure: IRRIGATION AND DEBRIDEMENT EXTREMITY;  Surgeon: Sharma Covert, MD;  Location: [facility] OR;  Service: Orthopedics;  Laterality: Left;  I & D left thumb    Social History:  reports that he has never smoked. He does not have any smokeless tobacco history on file. He reports that he drinks alcohol. He reports that he does not use illicit drugs. Home: where does patient live--home, ALF, SNF? and with whom if at home? Yes: Can patient participate in ADLs?  Allergies  Allergen Reactions  . Yellow Jacket Venom [Bee Venom] Hives, Shortness Of Breath and Rash  . Cozaar [Losartan] Other (See Comments)    Reaction unknown  . Plavix [Clopidogrel] Other (See Comments)    Reaction unknown    History reviewed. No pertinent family history.  (be sure to complete)  Prior to  Admission medications   Medication Sig Start Date End Date Taking? Authorizing Provider  amLODipine (NORVASC) 2.5 MG tablet Take 2.5 mg by mouth daily.   Yes Historical Provider, MD  aspirin EC 81 MG tablet Take 81 mg by mouth daily.   Yes Historical Provider, MD  atenolol (TENORMIN) 25 MG tablet Take 12.5 mg by mouth daily.    Yes Historical Provider, MD  EPINEPHrine (EPIPEN) 0.3 mg/0.3 mL SOAJ injection Inject 0.3 mg into the muscle once as needed (allergic reaction).   Yes Historical Provider, MD  fish oil-omega-3 fatty acids 1000 MG capsule Take 1 g by mouth daily.   Yes Historical Provider, MD  simvastatin (ZOCOR) 20 MG tablet Take 20 mg by mouth every evening.   Yes Historical Provider, MD   Physical Exam: Filed Vitals:   10/09/13 1405  BP: 144/76  Pulse: 57  Temp:   Resp:      General:  alert  Eyes: EOM-I  ENT: no oral ulcers   Neck: supple, no JVD  Cardiovascular: s1,s2 rrr  Respiratory: CTA BL   Abdomen: soft, nt, nd   Skin: no rash   Musculoskeletal: no LE edema  Psychiatric: no hallucinations   Neurologic: CN 2-12 intact   Labs on Admission:  Basic Metabolic Panel:  Recent Labs Lab 10/09/13 1330  NA 140  K 4.2  CL 105  CO2 24  GLUCOSE 111*  BUN 15  CREATININE 0.94  CALCIUM 9.7   Liver Function Tests: No results found for this basename: AST, ALT, ALKPHOS, BILITOT, PROT, ALBUMIN,  in the last 168 hours No results found for  this basename: LIPASE, AMYLASE,  in the last 168 hours No results found for this basename: AMMONIA,  in the last 168 hours CBC:  Recent Labs Lab 10/09/13 1349  WBC 6.8  HGB 14.9  HCT 42.0  MCV 91.7  PLT 164   Cardiac Enzymes:  Recent Labs Lab 10/09/13 1330  TROPONINI <0.30    BNP (last 3 results) No results found for this basename: PROBNP,  in the last 8760 hours CBG: No results found for this basename: GLUCAP,  in the last 168 hours  Radiological Exams on Admission: Dg Chest 2 View  10/09/2013    CLINICAL DATA:  Chest tightness.  EXAM: CHEST  2 VIEW  COMPARISON:  07/11/2013  FINDINGS: Changes from cardiac surgery are stable. The cardiac silhouette is normal in size and configuration. The mediastinum is normal in contour. No hilar masses.  Clear lungs. No pleural effusion or pneumothorax.  The bony thorax is demineralized but intact.  IMPRESSION: No acute cardiopulmonary disease   Electronically Signed   By: Amie Portland M.D.   On: 10/09/2013 13:46    EKG: Independently reviewed. NSR, no acute ST/T  Changes   Assessment/Plan Active Problems:   Chest pain   HTN (hypertension)   CAD (coronary artery disease) of bypass graft   77 y.o. male with PMH of HTN, CAD s/p CABG, HPL presented with non exertional substernal chest pain  1. Chest pain atypical but significant risk factors;  -CXR: no acute findings; ECG, initial trop neg; patient is pain free  -cont tele monitor, serial trop, ECG; c/s cardiology; may need stress test   2. CAD s/p CABG; cont ASA, statin, BB  3. HPL cont statin   4.  HTN cont home regimen   Cardiology:  if consultant consulted, please document name and whether formally or informally consulted  Code Status: full (must indicate code status--if unknown or must be presumed, indicate so) Family Communication: none at the bedside (indicate person spoken with, if applicable, with phone number if by telephone) Disposition Plan: home likely in AM  (indicate anticipated LOS)  Time spent: >45 minutes   Esperanza Sheets [facility] Pager [phone]  If 7PM-7AM, please contact night-coverage [email or web address] Password [facility] 10/09/2013, 3:16 PM

## 2013-10-09 NOTE — ED Notes (Signed)
Dr Harrison at bedside

## 2013-10-09 NOTE — ED Provider Notes (Signed)
CSN: [id number]     Arrival date & time 10/09/13  1303 History   First MD Initiated Contact with Patient 10/09/13 1314     Chief Complaint  Patient presents with  . Chest Pain   (Consider location/radiation/quality/duration/timing/severity/associated sxs/prior Treatment) HPI Comments: Patient is a 77 year old male with a past medical history of CAD, CAGB 2011, stent placement 2006 with 85% stenosis of LAD and hypertension presents to the emergency department complaining of chest discomfort times one hour. Denies pain but states he is an uncomfortable feeling in the center of his chest that is similar to his heart attack in 1999. Admits to associated lightheadedness. Denies shortness of breath, nausea, vomiting or diaphoresis. States he has been doing less treadmill exercise recently because he has been busy, slightly increased stress. Also has been burping more frequently, denies heartburn or reflux. With his prior MI, he went to the store, had an uncomfortable feeling in his chest without pain and passed out. He has not seen cardiology in about 2 years. He has nitro in his pocket but did not feel he needed to take it because he was not having pain.  The history is provided by the patient.    Past Medical History  Diagnosis Date  . Coronary artery disease   . Hypertension    Past Surgical History  Procedure Laterality Date  . Coronary artery bypass graft    . Stents    . Back surgery    . I&d extremity  06/18/2012    Procedure: IRRIGATION AND DEBRIDEMENT EXTREMITY;  Surgeon: Sharma Covert, MD;  Location: [facility] OR;  Service: Orthopedics;  Laterality: Left;  I & D left thumb    History reviewed. No pertinent family history. History  Substance Use Topics  . Smoking status: Never Smoker   . Smokeless tobacco: Not on file  . Alcohol Use: Yes    Review of Systems  Cardiovascular: Positive for chest pain.  Neurological: Positive for light-headedness.  All other systems reviewed and are  negative.    Allergies  Yellow jacket venom; Cozaar; and Plavix  Home Medications   Current Outpatient Rx  Name  Route  Sig  Dispense  Refill  . amLODipine (NORVASC) 2.5 MG tablet   Oral   Take 2.5 mg by mouth daily.         Marland Kitchen aspirin EC 81 MG tablet   Oral   Take 81 mg by mouth daily.         Marland Kitchen atenolol (TENORMIN) 25 MG tablet   Oral   Take 25 mg by mouth daily.         Marland Kitchen EPINEPHrine (EPIPEN) 0.3 mg/0.3 mL SOAJ injection   Intramuscular   Inject 0.3 mg into the muscle once as needed (allergic reaction).         Marland Kitchen EPINEPHrine (EPIPEN) 0.3 mg/0.3 mL SOAJ injection   Intramuscular   Inject 0.3 mLs (0.3 mg total) into the muscle as needed.   2 Device   1   . fish oil-omega-3 fatty acids 1000 MG capsule   Oral   Take 1 g by mouth daily.         . predniSONE (DELTASONE) 20 MG tablet   Oral   Take 3 tablets (60 mg total) by mouth daily.   12 tablet   0   . simvastatin (ZOCOR) 20 MG tablet   Oral   Take 20 mg by mouth every evening.          BP  168/77  Pulse 68  Temp(Src) 98 F (36.7 C) (Oral)  Resp 17  Ht 5\' 11"  (1.803 m)  Wt 188 lb (85.276 kg)  BMI 26.23 kg/m2  SpO2 97% Physical Exam  Nursing note and vitals reviewed. Constitutional: He is oriented to person, place, and time. He appears well-developed and well-nourished. No distress.  HENT:  Head: Normocephalic and atraumatic.  Mouth/Throat: Oropharynx is clear and moist.  Eyes: Conjunctivae are normal.  Neck: Normal range of motion. Neck supple.  Cardiovascular: Normal rate, regular rhythm, normal heart sounds and intact distal pulses.  Exam reveals no gallop and no friction rub.   No murmur heard. Linear mid-sternal scar present.  Pulmonary/Chest: Effort normal and breath sounds normal. No respiratory distress.  Abdominal: Soft. Bowel sounds are normal. There is no tenderness.  Musculoskeletal: Normal range of motion. He exhibits no edema.  Neurological: He is alert and oriented to  person, place, and time. He has normal strength. No sensory deficit.  Skin: Skin is warm and dry. He is not diaphoretic.  Psychiatric: He has a normal mood and affect. His behavior is normal.    ED Course  Procedures (including critical care time) Labs Review Labs Reviewed  BASIC METABOLIC PANEL - Abnormal; Notable for the following:    Glucose, Bld 111 (*)    GFR calc non Af Amer 80 (*)    All other components within normal limits  TROPONIN I  CBC   Imaging Review Dg Chest 2 View  10/09/2013   CLINICAL DATA:  Chest tightness.  EXAM: CHEST  2 VIEW  COMPARISON:  07/11/2013  FINDINGS: Changes from cardiac surgery are stable. The cardiac silhouette is normal in size and configuration. The mediastinum is normal in contour. No hilar masses.  Clear lungs. No pleural effusion or pneumothorax.  The bony thorax is demineralized but intact.  IMPRESSION: No acute cardiopulmonary disease   Electronically Signed   By: Amie Portland M.D.   On: 10/09/2013 13:46    EKG Interpretation   None      Date: 10/09/2013  Rate: 71  Rhythm: normal sinus rhythm  QRS Axis: normal  Intervals: normal  ST/T Wave abnormalities: normal  Conduction Disutrbances:none  Narrative Interpretation: NSR, no stemi  Old EKG Reviewed: unchanged    MDM   1. Chest pain     Patient with chest discomfort, similar to prior MI. He is well appearing, NAD, VSS. EKG without significant findings. 325 mg ASA given. Labs pending- cbc, bmp, troponin. CXR. Plan to admit patient given history and nature of symptoms. Case discussed with attending Dr. Romeo Apple who also evaluated patient and agrees with plan of care. 3:10 PM Negative troponin. Chest x-ray clear. Patient will be admitted. Admission accepted by Dr. York Spaniel, [facility].  Trevor Mace, PA-C 10/09/13 1510

## 2013-10-09 NOTE — ED Notes (Signed)
Report given to Bosie Clos, RN on floor. Nurse has no further questions upon report given. Pt being prepared for transport to floor.

## 2013-10-09 NOTE — Progress Notes (Signed)
Unit CM UR Completed by [facility] ED CM  W. Coriana Angello RN  

## 2013-10-10 ENCOUNTER — Inpatient Hospital Stay (HOSPITAL_COMMUNITY): Payer: Medicare Other

## 2013-10-10 ENCOUNTER — Other Ambulatory Visit (HOSPITAL_COMMUNITY): Payer: Medicare Other

## 2013-10-10 DIAGNOSIS — R079 Chest pain, unspecified: Secondary | ICD-10-CM

## 2013-10-10 DIAGNOSIS — I2510 Atherosclerotic heart disease of native coronary artery without angina pectoris: Secondary | ICD-10-CM

## 2013-10-10 DIAGNOSIS — Z951 Presence of aortocoronary bypass graft: Secondary | ICD-10-CM

## 2013-10-10 LAB — TROPONIN I
Troponin I: <0.3 ng/mL (ref <0.30)
Troponin I: <0.3 ng/mL (ref <0.30)

## 2013-10-10 MED ORDER — ATENOLOL 12.5 MG HALF TABLET
12.5000 mg | ORAL_TABLET | Freq: Two times a day (BID) | ORAL | Status: DC
  Administered 2013-10-10 – 2013-10-11 (×2): 12.5 mg via ORAL
  Filled 2013-10-10 (×3): qty 1

## 2013-10-10 MED ORDER — TECHNETIUM TC 99M SESTAMIBI - CARDIOLITE
10.0000 | Freq: Once | INTRAVENOUS | Status: AC | PRN
  Administered 2013-10-10: 08:00:00 10 via INTRAVENOUS

## 2013-10-10 MED ORDER — TECHNETIUM TC 99M SESTAMIBI - CARDIOLITE
30.0000 | Freq: Once | INTRAVENOUS | Status: AC | PRN
  Administered 2013-10-10: 30 via INTRAVENOUS

## 2013-10-10 NOTE — Progress Notes (Signed)
Patient ID: Jose [name]  male  ZOX:[id number]    DOB: May 08, 1936    DOA: 10/09/2013  PCP: Thayer Headings, MD  Assessment/Plan: Principal Problem:   Chest pain - Troponins negative so far, EKG with no new changes - Stress test today  Active Problems:   HTN (hypertension): Currently stable    Coronary artery disease, hx of two inf. MIs one with v. fib arrest, stents to RCA and ultimately in 2011 CABG  - Continue aspirin, beta blocker, statin, Norvasc    Hyperlipidemia LDL goal <70 Continue statin-    DVT Prophylaxis:  Code Status:  Disposition: Patient was seen earlier this morning, wanted to DC home today, await stress test results    Subjective: No chest pain at the time of my encounter, no shortness of breath no fevers or chills   Objective: Weight change:  No intake or output data in the 24 hours ending 10/10/13 1119 Blood pressure 149/83, pulse 77, temperature 98.6 F (37 C), temperature source Oral, resp. rate 18, height 5\' 11"  (1.803 m), weight 85.276 kg (188 lb), SpO2 98.00%.  Physical Exam: General: Alert and awake, oriented x3, not in any acute distress. CVS: S1-S2 clear, no murmur rubs or gallops Chest: clear to auscultation bilaterally, no wheezing, rales or rhonchi, midline scar from prior CABG  Abdomen: soft nontender, nondistended, normal bowel sounds  Extremities: no cyanosis, clubbing or edema noted bilaterally Neuro: Cranial nerves II-XII intact, no focal neurological deficits  Lab Results: Basic Metabolic Panel:  Recent Labs Lab 10/09/13 1330  NA 140  K 4.2  CL 105  CO2 24  GLUCOSE 111*  BUN 15  CREATININE 0.94  CALCIUM 9.7   Liver Function Tests: No results found for this basename: AST, ALT, ALKPHOS, BILITOT, PROT, ALBUMIN,  in the last 168 hours No results found for this basename: LIPASE, AMYLASE,  in the last 168 hours No results found for this basename: AMMONIA,  in the last 168 hours CBC:  Recent Labs Lab 10/09/13 1349  WBC  6.8  HGB 14.9  HCT 42.0  MCV 91.7  PLT 164   Cardiac Enzymes:  Recent Labs Lab 10/09/13 1913 10/09/13 2312 10/10/13 0415  TROPONINI <0.30 <0.30 <0.30   BNP: No components found with this basename: POCBNP,  CBG: No results found for this basename: GLUCAP,  in the last 168 hours   Micro Results: No results found for this or any previous visit (from the past 240 hour(s)).  Studies/Results: Dg Chest 2 View  10/09/2013   CLINICAL DATA:  Chest tightness.  EXAM: CHEST  2 VIEW  COMPARISON:  07/11/2013  FINDINGS: Changes from cardiac surgery are stable. The cardiac silhouette is normal in size and configuration. The mediastinum is normal in contour. No hilar masses.  Clear lungs. No pleural effusion or pneumothorax.  The bony thorax is demineralized but intact.  IMPRESSION: No acute cardiopulmonary disease   Electronically Signed   By: Amie Portland M.D.   On: 10/09/2013 13:46    Medications: Scheduled Meds: . amLODipine  10 mg Oral Daily  . aspirin EC  81 mg Oral Daily  . atenolol  12.5 mg Oral Daily  . enoxaparin (LOVENOX) injection  40 mg Subcutaneous Q24H  . omega-3 acid ethyl esters  1 g Oral Daily  . simvastatin  20 mg Oral QPM  . sodium chloride  3 mL Intravenous Q12H      LOS: 1 day   RAI,RIPUDEEP M.D. [facility] 10/10/2013, 11:19 AM Pager: [phone]  If 7PM-7AM,  please contact night-coverage [email or web address] Password [email or web address]

## 2013-10-10 NOTE — ED Provider Notes (Signed)
Medical screening examination/treatment/procedure(s) were conducted as a shared visit with non-physician practitioner(s) and myself.  I personally evaluated the patient during the encounter.  EKG Interpretation     Ventricular Rate:  71 PR Interval:  164 QRS Duration: 100 QT Interval:  388 QTC Calculation: 421 R Axis:   -5 Text Interpretation:  Normal sinus rhythm Normal ECG no significant change from previous            I interviewed and examined the patient. Lungs are CTAB. Cardiac exam wnl. Abdomen soft.  Pt stable on my exam, minimal to no pain. Plan on admission.   Junius Argyle, MD 10/10/13 1106

## 2013-10-10 NOTE — Progress Notes (Signed)
Subjective:   Objective: Vital signs in last 24 hours: Temp:  [97.8 F (36.6 C)-98.6 F (37 C)] 98.6 F (37 C) (11/08 0602) Pulse Rate:  [54-125] 77 (11/08 1021) Resp:  [12-18] 18 (11/08 0602) BP: (121-182)/(65-90) 149/83 mmHg (11/08 1021) SpO2:  [94 %-100 %] 98 % (11/08 0602) Weight:  [188 lb (85.276 kg)] 188 lb (85.276 kg) (11/07 1308) Weight change:  Last BM Date: 10/10/13 Intake/Output from previous day:   Intake/Output this shift:    PE: General:Pleasant affect, NAD Skin:Warm and dry, brisk capillary refill HEENT:normocephalic, sclera clear, mucus membranes moist Heart:S1S2 RRR without murmur, gallup, rub or click Lungs:clear without rales, rhonchi, or wheezes ZOX:WRUE, non tender, + BS, do not palpate liver spleen or masses Ext:no lower ext edema, 2+ pedal pulses, 2+ radial pulses Neuro:alert and oriented, MAE, follows commands, + facial symmetry   Lab Results:  Recent Labs  10/09/13 1349  WBC 6.8  HGB 14.9  HCT 42.0  PLT 164   BMET  Recent Labs  10/09/13 1330  NA 140  K 4.2  CL 105  CO2 24  GLUCOSE 111*  BUN 15  CREATININE 0.94  CALCIUM 9.7    Recent Labs  10/09/13 2312 10/10/13 0415  TROPONINI <0.30 <0.30    No results found for this basename: CHOL, HDL, LDLCALC, LDLDIRECT, TRIG, CHOLHDL   Lab Results  Component Value Date   HGBA1C  Value: 5.4 (NOTE)                                                                       According to the ADA Clinical Practice Recommendations for 2011, when HbA1c is used as a screening test:   >=6.5%   Diagnostic of Diabetes Mellitus           (if abnormal result  is confirmed)  5.7-6.4%   Increased risk of developing Diabetes Mellitus  References:Diagnosis and Classification of Diabetes Mellitus,Diabetes Care,2011,34(Suppl 1):S62-S69 and Standards of Medical Care in         Diabetes - 2011,Diabetes Care,2011,34  (Suppl 1):S11-S61. 06/21/2010     No results found for this basename: TSH        Studies/Results: Dg Chest 2 View  10/09/2013   CLINICAL DATA:  Chest tightness.  EXAM: CHEST  2 VIEW  COMPARISON:  07/11/2013  FINDINGS: Changes from cardiac surgery are stable. The cardiac silhouette is normal in size and configuration. The mediastinum is normal in contour. No hilar masses.  Clear lungs. No pleural effusion or pneumothorax.  The bony thorax is demineralized but intact.  IMPRESSION: No acute cardiopulmonary disease   Electronically Signed   By: Amie Portland M.D.   On: 10/09/2013 13:46    Medications: I have reviewed the patient's current medications. Scheduled Meds: . amLODipine  10 mg Oral Daily  . aspirin EC  81 mg Oral Daily  . atenolol  12.5 mg Oral Daily  . enoxaparin (LOVENOX) injection  40 mg Subcutaneous Q24H  . omega-3 acid ethyl esters  1 g Oral Daily  . simvastatin  20 mg Oral QPM  . sodium chloride  3 mL Intravenous Q12H   Continuous Infusions:  PRN Meds:.acetaminophen, acetaminophen, ondansetron (ZOFRAN) IV, ondansetron  Assessment/Plan: Active  Problems:   Chest pain   HTN (hypertension)   Coronary artery disease, hx of two inf. MIs one with v. fib arrest, stents to RCA and ultimately in 2011 CABG    Hx of myocardial infarction   Hyperlipidemia LDL goal <70  PLAN: negative MI no other symptoms.  Will proceed with stress myoview.  Addendum  Stress myoview completed with mild inf st depression and freq PVCs and short burst on NSVT, and couplets, triplets.  LOS: 1 day   Time spent with pt. :15 minutes. [facility] R  Nurse Practitioner Certified Pager [phone] 10/10/2013, 11:08 AM  Nuclear stress FINDINGS:  Patient exercised for 8 min and achieved target heart rate. There  was downsloping ST changes on the stress EKG.  Perfusion: No decreased profusion to the left ventricle suggests  reversible ischemia or infarction.  Wall Motion: There is septal hypokinesia. Normal contraction.  Ejection Fraction: 57 %  End-diastolic volume equals: 85 ml   End systolic volume equals: 37 ml  IMPRESSION:  1. No reversible ischemia or infarction.  2. Septal hypokinesia  3. Ejection fraction equals 57 %     Patient seen and examined. Agree with assessment and plan. No ischemia on nuc but ectopy with short burst of NSVT. Will increase atenolol to 12.5 mg bid dosing. Prob keep today and monitor and dc tomorrow.   Lennette Bihari, MD, [facility] 10/10/2013 1:41 PM

## 2013-10-11 DIAGNOSIS — I2510 Atherosclerotic heart disease of native coronary artery without angina pectoris: Secondary | ICD-10-CM

## 2013-10-11 DIAGNOSIS — Z8679 Personal history of other diseases of the circulatory system: Secondary | ICD-10-CM

## 2013-10-11 MED ORDER — ATENOLOL 25 MG PO TABS: 12.5000 mg | ORAL_TABLET | Freq: Two times a day (BID) | ORAL | Status: DC

## 2013-10-11 MED ORDER — AMLODIPINE BESYLATE 10 MG PO TABS: 10.0000 mg | ORAL_TABLET | Freq: Every day | ORAL | Status: DC

## 2013-10-11 NOTE — Discharge Summary (Signed)
Physician Discharge Summary  Patient ID: Jose [name] MRN: [id number] DOB/AGE: March 23, 1936 77 y.o.  Admit date: 10/09/2013 Discharge date: 10/11/2013  Primary Care Physician:  Thayer Headings, MD  Discharge Diagnoses:    . Chest pain . HTN (hypertension) . (Resolved) CAD (coronary artery disease) of bypass graft . Coronary artery disease, hx of two inf. MIs one with v. fib arrest, stents to RCA and ultimately in 2011 CABG  . Hyperlipidemia LDL goal <70  Consults: Cardiology, Dr. Tresa Endo   Recommendations for Outpatient Follow-up:  Patient was recommended to follow up outpatient with cardiology due to his prior extensive cardiac history - Patient was placed on amlodipine 10 mg, atenolol 12.5mg  BID by cardiology, please follow BP/HR  Allergies:   Allergies  Allergen Reactions  . Yellow Jacket Venom [Bee Venom] Hives, Shortness Of Breath and Rash  . Benicar [Olmesartan]     Enlarged breasts  . Cozaar [Losartan] Other (See Comments)    Reaction unknown  . Plavix [Clopidogrel] Other (See Comments)    Reaction unknown  . Relafen [Nabumetone]   . Statins   . Tekturna [Aliskiren]      Discharge Medications:   Medication List         amLODipine 10 MG tablet  Commonly known as:  NORVASC  Take 1 tablet (10 mg total) by mouth daily.     aspirin EC 81 MG tablet  Take 81 mg by mouth daily.     atenolol 25 MG tablet  Commonly known as:  TENORMIN  Take 0.5 tablets (12.5 mg total) by mouth 2 (two) times daily.     EPIPEN 0.3 mg/0.3 mL Soaj injection  Generic drug:  EPINEPHrine  Inject 0.3 mg into the muscle once as needed (allergic reaction).     fish oil-omega-3 fatty acids 1000 MG capsule  Take 1 g by mouth daily.     simvastatin 20 MG tablet  Commonly known as:  ZOCOR  Take 20 mg by mouth every evening.         Brief H and P: For complete details please refer to admission H and P, but in brief patient is a 77 year old male with past medical history pertinent,  where she status post CABG, lipidemia presented to the ER with nonexertional substernal chest pain without any radiation no associated shortness of breath, palpitations, diaphoresis. Patient was committed for chest pain workup.  Hospital Course:   Chest pain - Troponins remained negative so far, patient had no acute EKG changes suggestive of ischemia. Patient however has extensive cardiac history with coronary artery disease , history of two inf. MIs one with v. fib arrest, stents to RCA and ultimately in 2011 CABG. cardiology was consulted and patient underwent nuclear medicine stress test which showed no reversible ischemia or infarction, septal hypokinesia, EF 57%. Norvasc was increased to 10 mg daily and atenolol to 12.5 mg bid by cardiology. Even though, patient had no ischemia on the nuclear medicine stress test but did have ectopy with short burst of NSVT during the stress test. Atenolol was increased to 2.5 mg twice a day by cardiology, patient has been able to tolerate it and had no further cardiac arrhythmias.  HTN (hypertension): Currently stable   Hyperlipidemia LDL goal <70: continue statin   Day of Discharge BP 146/65  Pulse 52  Temp(Src) 97.9 F (36.6 C) (Oral)  Resp 16  Ht 5\' 11"  (1.803 m)  Wt 85.276 kg (188 lb)  BMI 26.23 kg/m2  SpO2 96%  Physical Exam: General: Alert  and awake oriented x3 not in any acute distress. CVS: S1-S2 clear no murmur rubs or gallops Chest: clear to auscultation bilaterally, no wheezing rales or rhonchi Abdomen: soft nontender, nondistended, normal bowel sounds Extremities: no cyanosis, clubbing or edema noted bilaterally Neuro: Cranial nerves II-XII intact, no focal neurological deficits   The results of significant diagnostics from this hospitalization (including imaging, microbiology, ancillary and laboratory) are listed below for reference.    LAB RESULTS: Basic Metabolic Panel:  Recent Labs Lab 10/09/13 1330  NA 140  K 4.2  CL  105  CO2 24  GLUCOSE 111*  BUN 15  CREATININE 0.94  CALCIUM 9.7   Liver Function Tests: No results found for this basename: AST, ALT, ALKPHOS, BILITOT, PROT, ALBUMIN,  in the last 168 hours No results found for this basename: LIPASE, AMYLASE,  in the last 168 hours No results found for this basename: AMMONIA,  in the last 168 hours CBC:  Recent Labs Lab 10/09/13 1349  WBC 6.8  HGB 14.9  HCT 42.0  MCV 91.7  PLT 164   Cardiac Enzymes:  Recent Labs Lab 10/09/13 2312 10/10/13 0415  TROPONINI <0.30 <0.30   BNP: No components found with this basename: POCBNP,  CBG: No results found for this basename: GLUCAP,  in the last 168 hours  Significant Diagnostic Studies:  Dg Chest 2 View  10/09/2013   CLINICAL DATA:  Chest tightness.  EXAM: CHEST  2 VIEW  COMPARISON:  07/11/2013  FINDINGS: Changes from cardiac surgery are stable. The cardiac silhouette is normal in size and configuration. The mediastinum is normal in contour. No hilar masses.  Clear lungs. No pleural effusion or pneumothorax.  The bony thorax is demineralized but intact.  IMPRESSION: No acute cardiopulmonary disease   Electronically Signed   By: Amie Portland M.D.   On: 10/09/2013 13:46   Nm Myocar Multi W/spect W/wall Motion / Ef  10/10/2013   CLINICAL DATA:  77 year old male with history of CAD and CABG. Chest pain  EXAM: MYOCARDIAL IMAGING WITH SPECT (REST AND EXERCISE)  GATED LEFT VENTRICULAR WALL MOTION STUDY  LEFT VENTRICULAR EJECTION FRACTION  TECHNIQUE: Standard myocardial SPECT imaging was performed after resting intravenous injection of 10 mCi Tc-38m sestamibi. Subsequently, exercise tolerance test was performed by the patient under the supervision of the Cardiology staff. At peak-stress, 30 mCi Tc-49m sestamibi was injected intravenously and standard myocardial SPECT imaging was performed. Quantitative gated imaging was also performed to evaluate left ventricular wall motion, and estimate left ventricular  ejection fraction.  COMPARISON:  DG CHEST 2 VIEW dated 10/09/2013  FINDINGS: Patient exercised for 8 min and achieved target heart rate. There was downsloping ST changes on the stress EKG.  Perfusion: No decreased profusion to the left ventricle suggests reversible ischemia or infarction.  Wall Motion: There is septal hypokinesia.  Normal contraction.  Ejection Fraction: 57 %  End-diastolic volume equals: 85 ml  End systolic volume equals: 37 ml  IMPRESSION: 1. No reversible ischemia or infarction.  2. Septal hypokinesia  3. Ejection fraction equals 57 %   Electronically Signed   By: Genevive Bi M.D.   On: 10/10/2013 11:49       Disposition and Follow-up: Discharge Orders   Future Orders Complete By Expires   Diet - low sodium heart healthy  As directed    Increase activity slowly  As directed        DISPOSITION: Home  DIET: Heart healthy ACTIVITY: As tolerated  DISCHARGE FOLLOW-UP Follow-up Information  Follow up with Thayer Headings, MD. Schedule an appointment as soon as possible for a visit in 10 days.   Specialty:  Internal Medicine   Contact information:   [address] [place] [address] [phone]       Follow up with CROITORU,MIHAI, MD. Schedule an appointment as soon as possible for a visit in 2 weeks. (for hospital follow-up)    Specialty:  Cardiology   Contact information:   [address] [place] [address] [phone]       Time spent on Discharge: 35 mins  Signed:   RAI,RIPUDEEP M.D. [facility] 10/11/2013, 10:41 AM Pager: [phone]

## 2013-10-16 ENCOUNTER — Ambulatory Visit (INDEPENDENT_AMBULATORY_CARE_PROVIDER_SITE_OTHER): Payer: Medicare Other | Admitting: *Deleted

## 2013-10-16 ENCOUNTER — Ambulatory Visit: Payer: Medicare Other | Admitting: Cardiovascular Disease

## 2013-10-16 DIAGNOSIS — R079 Chest pain, unspecified: Secondary | ICD-10-CM

## 2013-10-16 NOTE — Progress Notes (Signed)
Pt in for event monitor.  No monitors available.  Pt informed he can have one mailed to him or wait for one to come in office and return for placement.  Pt stated he has worn one before and asked that he be called back when monitors in so he can come back.  Stated he doesn't live that far away.    Pt also wanted to know why his amlodipine was increased from 2.5 mg to 10 mg.  Stated his BP wasn't that high.  Nada Boozer, NP notified and advised pt's BP was elevated when he was admitted and thoughts that elevated BP was the cause of symptoms, so amlodipine increased.  Also advised monitor for 2 weeks.  Pt informed and verbalized understanding.  Stated he feels better when his BP in the 150s and 160s.  Stated he saw on Dr. Neil Crouch that older people are okay to have a BP in the 150s.  Pt informed Dr. Neil Crouch is not a cardiologist.  Pt stated he is a doctor though.  Pt informed he cannot rely on information from someone who is not a specialist as the cardiologist is best in this case.  Pt verbalized understanding and stated he will take it (amlodipine).  Pt discharged and informed RN will call him back today if monitors come in.  Pt verbalized understanding and agreed w/ plan.   3:58pm - No monitors received today.  Will contact pt when received.

## 2013-10-19 ENCOUNTER — Telehealth: Payer: Self-pay | Admitting: *Deleted

## 2013-10-19 NOTE — Telephone Encounter (Signed)
Returned call.  Pt informed message received and no monitors in yet.  Pt informed he will be notified when one is available or one can be mailed to him.  Pt verbalized understanding and asked if he can be notified when one is available in the office.  Informed he will be notified.  Pt verbalized understanding and agreed w/ plan.

## 2013-10-19 NOTE — Telephone Encounter (Signed)
Pt was calling in regards to his monitor that he was supposed to pick up. He stated that it was supposed to come in last week and someone was going to call him to come get it.

## 2013-10-20 ENCOUNTER — Ambulatory Visit: Payer: Medicare Other | Admitting: Cardiovascular Disease

## 2013-10-20 ENCOUNTER — Ambulatory Visit (INDEPENDENT_AMBULATORY_CARE_PROVIDER_SITE_OTHER): Payer: Medicare Other | Admitting: *Deleted

## 2013-10-20 DIAGNOSIS — R55 Syncope and collapse: Secondary | ICD-10-CM

## 2013-10-20 NOTE — Progress Notes (Signed)
Pt in for monitor placement.  Per Dr. Royann Shivers, 30-day event monitor for presyncope.  Orders placed and monitor set up.  Monitor placed on pt and questions answered to satisfaction.  Pt also with concerns r/t BP.  Stated he is not convinced his amlodipine was supposed to be increased to 10 mg daily.  Stated he has been taking his regular dose of 2.5 mg daily and his BP was 135/67 last night.  Stated maybe it was supposed to be doubled, but not four times the dose he was taking.  Pt informed Dr. Royann Shivers will be notified for further instructions.  Dr. Royann Shivers notified and advised pt continue current dose of amlodipine at 2.5 mg daily and he will reevaluate at appt next week.  Informed pt per instructions by MD.  Pt verbalized understanding and agreed w/ plan.  Pt discharged w/ monitor in hand.

## 2013-10-28 ENCOUNTER — Ambulatory Visit (INDEPENDENT_AMBULATORY_CARE_PROVIDER_SITE_OTHER): Payer: Medicare Other | Admitting: Cardiovascular Disease

## 2013-10-28 ENCOUNTER — Encounter: Payer: Self-pay | Admitting: Cardiovascular Disease

## 2013-10-28 VITALS — BP 159/79 | HR 58 | Ht 71.0 in | Wt 195.9 lb

## 2013-10-28 DIAGNOSIS — I2510 Atherosclerotic heart disease of native coronary artery without angina pectoris: Secondary | ICD-10-CM

## 2013-10-28 DIAGNOSIS — E785 Hyperlipidemia, unspecified: Secondary | ICD-10-CM

## 2013-10-28 DIAGNOSIS — I10 Essential (primary) hypertension: Secondary | ICD-10-CM

## 2013-10-28 NOTE — Patient Instructions (Signed)
Your physician recommends that you schedule a follow-up appointment in: 6 Months  

## 2013-10-31 NOTE — Assessment & Plan Note (Signed)
Reassuring recent nuclear perfusion study. No plan for invasive evaluation at this time.

## 2013-10-31 NOTE — Assessment & Plan Note (Signed)
Continues to have episodic elevations in blood pressure but also complains of dizziness when bending over. No further changes are made to his medications yet.

## 2013-10-31 NOTE — Progress Notes (Signed)
Patient ID: Jose [name], male   DOB: [name], 77 y.o.   MRN: [id number]      Reason for office visit CAD status post CABG and PCI, followup after recent hospitalization  "Jose Jose [name]" [name] was admitted to the hospital in early November with complaints of chest discomfort and dizziness when bending over, similar to previous presentation. He has a history of myocardial infarction in 1990 08/12/2005 (the latter complicated by ventricular fibrillation arrest) and ultimately underwent bypass surgery in 2011. During the recent hospitalization he underwent a nuclear stress test that showed normal perfusion and an ejection fraction of 57%. Brief nonsustained CT was noted during the stress test. The dose of beta blocker was increased. Since then he has done well without complaints of shortness of breath, edema or dizziness. He has not had palpitations. There was some confusion surrounding his antihypertensive medications. He is now wearing a 30 day event monitor. So far no meaningful arrhythmia has been detected.   Allergies  Allergen Reactions  . Yellow Jacket Venom [Bee Venom] Hives, Shortness Of Breath and Rash  . Benicar [Olmesartan]     Enlarged breasts  . Cozaar [Losartan] Other (See Comments)    Reaction unknown  . Plavix [Clopidogrel] Other (See Comments)    Reaction unknown  . Relafen [Nabumetone]   . Statins   . Tekturna [Aliskiren]     Current Outpatient Prescriptions  Medication Sig Dispense Refill  . amLODipine (NORVASC) 2.5 MG tablet Take 2.5 mg by mouth daily.      Marland Kitchen aspirin EC 81 MG tablet Take 81 mg by mouth daily.      Marland Kitchen atenolol (TENORMIN) 25 MG tablet Take 0.5 tablets (12.5 mg total) by mouth 2 (two) times daily.  60 tablet  2  . EPINEPHrine (EPIPEN) 0.3 mg/0.3 mL SOAJ injection Inject 0.3 mg into the muscle once as needed (allergic reaction).      . fish oil-omega-3 fatty acids 1000 MG capsule Take 1 g by mouth daily.      . simvastatin (ZOCOR) 20 MG tablet Take 20  mg by mouth every evening.       No current facility-administered medications for this visit.    Past Medical History  Diagnosis Date  . Coronary artery disease     hx CABG 2011  . Hypertension   . Hyperlipidemia LDL goal <70   . Gout   . H/O cardiovascular stress test 2011      mild to mod ischemia this led to cath and CABG  . Hx of echocardiogram 2010    EF >55%, mild to mod MR, mild aortic reg.   . Kidney stone   . Complication of anesthesia     "hard time waking up from it alot of times" (10/09/2013)  . Myocardial infarction 1999; 2006    both acute inferior wall  . Ventricular fibrillation 2006    arrest with MI  . GERD (gastroesophageal reflux disease)     "not in >30 yr" (10/09/2013)  . Skin cancer     "right nose, lower lip, right ankle, left side" (10/09/2013)    Past Surgical History  Procedure Laterality Date  . Coronary artery bypass graft  2011    LIMA-LAD; seq VG-1st & 2nd OM of LCX; VG-PDA of RCA  . Back surgery    . I&d extremity  06/18/2012    Procedure: IRRIGATION AND DEBRIDEMENT EXTREMITY;  Surgeon: Sharma Covert, MD;  Location: [facility] OR;  Service: Orthopedics;  Laterality: Left;  I &  D left thumb   . Coronary angioplasty with stent placement  1999    Stent to RCA  . Coronary angioplasty with stent placement  2006    stent to distal RCA  . Coronary angioplasty with stent placement  2006    mid LAD with Cypher stent  . Lumbar disc surgery  1998    "disc ruptured" (10/09/2013)  . Cystoscopy w/ stone manipulation  12/2003  . Lithotripsy      "several times" (10/09/2013)  . Cystoscopy      "had alot of laser for kidney stones" (10/09/2013)  . Skin cancer excision      "right nose, lower lip; right ankle, left side" (10/09/2013)    No family history on file.  History   Social History  . Marital Status: Married    Spouse Name: N/A    Number of Children: N/A  . Years of Education: N/A   Occupational History  . Not on file.   Social History Main  Topics  . Smoking status: Never Smoker   . Smokeless tobacco: Never Used  . Alcohol Use: Yes     Comment: 10/09/2013 "I drink a beer occasionally; not regular"  . Drug Use: No  . Sexual Activity: Yes   Other Topics Concern  . Not on file   Social History Narrative  . No narrative on file    Review of systems: The patient specifically denies any chest pain at rest or with exertion, dyspnea at rest or with exertion, orthopnea, paroxysmal nocturnal dyspnea, syncope, palpitations, focal neurological deficits, intermittent claudication, lower extremity edema, unexplained weight gain, cough, hemoptysis or wheezing.  The patient also denies abdominal pain, nausea, vomiting, dysphagia, diarrhea, constipation, polyuria, polydipsia, dysuria, hematuria, frequency, urgency, abnormal bleeding or bruising, fever, chills, unexpected weight changes, mood swings, change in skin or hair texture, change in voice quality, auditory or visual problems, allergic reactions or rashes, new musculoskeletal complaints other than usual "aches and pains".   PHYSICAL EXAM BP 159/79  Pulse 58  Ht 5\' 11"  (1.803 m)  Wt 195 lb 14.4 oz (88.86 kg)  BMI 27.33 kg/m2 Recheck BP 130/74 mm Hg General: Alert, oriented x3, no distress Head: no evidence of trauma, PERRL, EOMI, no exophtalmos or lid lag, no myxedema, no xanthelasma; normal ears, nose and oropharynx Neck: normal jugular venous pulsations and no hepatojugular reflux; brisk carotid pulses without delay and no carotid bruits Chest: clear to auscultation, no signs of consolidation by percussion or palpation, normal fremitus, symmetrical and full respiratory excursions, sternotomy scar Cardiovascular: normal position and quality of the apical impulse, regular rhythm, normal first and second heart sounds, no murmurs, rubs or gallops Abdomen: no tenderness or distention, no masses by palpation, no abnormal pulsatility or arterial bruits, normal bowel sounds, no  hepatosplenomegaly Extremities: no clubbing, cyanosis or edema; 2+ radial, ulnar and brachial pulses bilaterally; 2+ right femoral, posterior tibial and dorsalis pedis pulses; 2+ left femoral, posterior tibial and dorsalis pedis pulses; no subclavian or femoral bruits Neurological: grossly nonfocal   EKG: NSR, lateral precordial T wave inversion   BMET    Component Value Date/Time   NA 140 10/09/2013 1330   K 4.2 10/09/2013 1330   CL 105 10/09/2013 1330   CO2 24 10/09/2013 1330   GLUCOSE 111* 10/09/2013 1330   BUN 15 10/09/2013 1330   CREATININE 0.94 10/09/2013 1330   CALCIUM 9.7 10/09/2013 1330   GFRNONAA 80* 10/09/2013 1330   GFRAA >90 10/09/2013 1330     ASSESSMENT AND  PLAN Coronary artery disease, hx of two inf. MIs one with v. fib arrest, stents to RCA and ultimately in 2011 CABG  Reassuring recent nuclear perfusion study. No plan for invasive evaluation at this time.  HTN (hypertension) Continues to have episodic elevations in blood pressure but also complains of dizziness when bending over. No further changes are made to his medications yet.  Hyperlipidemia LDL goal <70 States that he is intolerant to statins, but seems to be doing pre-well on the current dose of simvastatin. Avoid higher doses because of the potential interaction of amlodipine.   Patient Instructions  Your physician recommends that you schedule a follow-up appointment in: 6 Months    Meds ordered this encounter  Medications  . amLODipine (NORVASC) 2.5 MG tablet    Sig: Take 2.5 mg by mouth daily.    Junious Silk, MD, [facility] [phone] office [phone] pager

## 2013-10-31 NOTE — Assessment & Plan Note (Signed)
States that he is intolerant to statins, but seems to be doing pre-well on the current dose of simvastatin. Avoid higher doses because of the potential interaction of amlodipine.

## 2013-11-18 ENCOUNTER — Encounter: Payer: Self-pay | Admitting: Cardiovascular Disease

## 2014-07-10 ENCOUNTER — Encounter (HOSPITAL_COMMUNITY): Payer: Self-pay | Admitting: Emergency Medicine

## 2014-07-10 ENCOUNTER — Emergency Department (HOSPITAL_COMMUNITY)
Admission: EM | Admit: 2014-07-10 | Discharge: 2014-07-10 | Disposition: A | Discharge status: Home / Self Care | Payer: Medicare Other | Attending: Emergency Medicine | Admitting: Emergency Medicine

## 2014-07-10 DIAGNOSIS — T63461A Toxic effect of venom of wasps, accidental (unintentional), initial encounter: Secondary | ICD-10-CM | POA: Insufficient documentation

## 2014-07-10 DIAGNOSIS — Y929 Unspecified place or not applicable: Secondary | ICD-10-CM | POA: Insufficient documentation

## 2014-07-10 DIAGNOSIS — I2510 Atherosclerotic heart disease of native coronary artery without angina pectoris: Secondary | ICD-10-CM | POA: Insufficient documentation

## 2014-07-10 DIAGNOSIS — M109 Gout, unspecified: Secondary | ICD-10-CM | POA: Insufficient documentation

## 2014-07-10 DIAGNOSIS — E785 Hyperlipidemia, unspecified: Secondary | ICD-10-CM | POA: Insufficient documentation

## 2014-07-10 DIAGNOSIS — Z8719 Personal history of other diseases of the digestive system: Secondary | ICD-10-CM | POA: Diagnosis not present

## 2014-07-10 DIAGNOSIS — Z951 Presence of aortocoronary bypass graft: Secondary | ICD-10-CM | POA: Diagnosis not present

## 2014-07-10 DIAGNOSIS — I252 Old myocardial infarction: Secondary | ICD-10-CM | POA: Insufficient documentation

## 2014-07-10 DIAGNOSIS — Z9889 Other specified postprocedural states: Secondary | ICD-10-CM | POA: Diagnosis not present

## 2014-07-10 DIAGNOSIS — I10 Essential (primary) hypertension: Secondary | ICD-10-CM | POA: Insufficient documentation

## 2014-07-10 DIAGNOSIS — Z85828 Personal history of other malignant neoplasm of skin: Secondary | ICD-10-CM | POA: Insufficient documentation

## 2014-07-10 DIAGNOSIS — Z7982 Long term (current) use of aspirin: Secondary | ICD-10-CM | POA: Insufficient documentation

## 2014-07-10 DIAGNOSIS — T6391XA Toxic effect of contact with unspecified venomous animal, accidental (unintentional), initial encounter: Secondary | ICD-10-CM | POA: Insufficient documentation

## 2014-07-10 DIAGNOSIS — T63481A Toxic effect of venom of other arthropod, accidental (unintentional), initial encounter: Secondary | ICD-10-CM

## 2014-07-10 DIAGNOSIS — Y939 Activity, unspecified: Secondary | ICD-10-CM | POA: Diagnosis not present

## 2014-07-10 DIAGNOSIS — Z87442 Personal history of urinary calculi: Secondary | ICD-10-CM | POA: Insufficient documentation

## 2014-07-10 DIAGNOSIS — Z79899 Other long term (current) drug therapy: Secondary | ICD-10-CM | POA: Insufficient documentation

## 2014-07-10 NOTE — ED Provider Notes (Signed)
CSN: [id number]     Arrival date & time 07/10/14  1609 History   First MD Initiated Contact with Patient 07/10/14 1643     Chief Complaint  Patient presents with  . Insect Bite    bee sting      (Consider location/radiation/quality/duration/timing/severity/associated sxs/prior Treatment) HPI 17 rolled male presents after being stung by an insect approximately one hour ago. He states he thinks the insect was some sort of be and he is severely allergic to most pes. The patient was stung on his left thumb. Approximately 15-20 minutes later he injected his EpiPen in his left thigh. This person is ever injected this before. He states he does like her blood is unsure of the medicine was administered. He has not felt any symptoms such as shortness of breath, throat closing sensation, change in voice, vomiting, or rash. He feels normal. He's had severe allergic reactions 3 times, most of which he ends up passed out. He has not felt bad all this time. He was unable to identify the insect.  Past Medical History  Diagnosis Date  . Coronary artery disease     hx CABG 2011  . Hypertension   . Hyperlipidemia LDL goal <70   . Gout   . H/O cardiovascular stress test 2011      mild to mod ischemia this led to cath and CABG  . Hx of echocardiogram 2010    EF >55%, mild to mod MR, mild aortic reg.   . Kidney stone   . Complication of anesthesia     "hard time waking up from it alot of times" (10/09/2013)  . Myocardial infarction 1999; 2006    both acute inferior wall  . Ventricular fibrillation 2006    arrest with MI  . GERD (gastroesophageal reflux disease)     "not in >30 yr" (10/09/2013)  . Skin cancer     "right nose, lower lip, right ankle, left side" (10/09/2013)   Past Surgical History  Procedure Laterality Date  . Coronary artery bypass graft  2011    LIMA-LAD; seq VG-1st & 2nd OM of LCX; VG-PDA of RCA  . Back surgery    . I&d extremity  06/18/2012    Procedure: IRRIGATION AND DEBRIDEMENT  EXTREMITY;  Surgeon: Linna Hoff, MD;  Location: [facility];  Service: Orthopedics;  Laterality: Left;  I & D left thumb   . Coronary angioplasty with stent placement  1999    Stent to RCA  . Coronary angioplasty with stent placement  2006    stent to distal RCA  . Coronary angioplasty with stent placement  2006    mid LAD with Cypher stent  . Lumbar disc surgery  1998    "disc ruptured" (10/09/2013)  . Cystoscopy w/ stone manipulation  12/2003  . Lithotripsy      "several times" (10/09/2013)  . Cystoscopy      "had alot of laser for kidney stones" (10/09/2013)  . Skin cancer excision      "right nose, lower lip; right ankle, left side" (10/09/2013)   History reviewed. No pertinent family history. History  Substance Use Topics  . Smoking status: Never Smoker   . Smokeless tobacco: Never Used  . Alcohol Use: Yes     Comment: 10/09/2013 "I drink a beer occasionally; not regular"    Review of Systems  HENT: Negative for sore throat, trouble swallowing and voice change.   Respiratory: Negative for shortness of breath and wheezing.   Cardiovascular: Negative  for chest pain.  Gastrointestinal: Negative for vomiting.  Skin: Negative for rash.  All other systems reviewed and are negative.     Allergies  Yellow jacket venom; Benicar; Cozaar; Plavix; Relafen; Statins; and Tekturna  Home Medications   Prior to Admission medications   Medication Sig Start Date End Date Taking? Authorizing Provider  amLODipine (NORVASC) 2.5 MG tablet Take 2.5 mg by mouth daily.   Yes Historical Provider, MD  aspirin EC 81 MG tablet Take 81 mg by mouth daily.   Yes Historical Provider, MD  atenolol (TENORMIN) 25 MG tablet Take 0.5 tablets (12.5 mg total) by mouth 2 (two) times daily. 10/11/13  Yes Ripudeep Krystal Eaton, MD  EPINEPHrine (EPIPEN) 0.3 mg/0.3 mL SOAJ injection Inject 0.3 mg into the muscle once as needed (allergic reaction).   Yes Historical Provider, MD  fish oil-omega-3 fatty acids 1000 MG capsule  Take 1 g by mouth daily.   Yes Historical Provider, MD  simvastatin (ZOCOR) 20 MG tablet Take 20 mg by mouth every evening.   Yes Historical Provider, MD   BP 144/73  Pulse 79  Temp(Src) 98.3 F (36.8 C) (Oral)  Resp 16  SpO2 97% Physical Exam  Nursing note and vitals reviewed. Constitutional: He is oriented to person, place, and time. He appears well-developed and well-nourished.  HENT:  Head: Normocephalic and atraumatic.  Right Ear: External ear normal.  Left Ear: External ear normal.  Nose: Nose normal.  Mouth/Throat: Uvula is midline and oropharynx is clear and moist. No uvula swelling.  Eyes: Right eye exhibits no discharge. Left eye exhibits no discharge.  Neck: Neck supple.  Cardiovascular: Normal rate, regular rhythm, normal heart sounds and intact distal pulses.   Pulmonary/Chest: Effort normal and breath sounds normal. No stridor. He has no wheezes.  Abdominal: Soft. He exhibits no distension. There is no tenderness.  Musculoskeletal: He exhibits no edema.  Neurological: He is alert and oriented to person, place, and time.  Skin: Skin is warm and dry.       ED Course  Procedures (including critical care time) Labs Review Labs Reviewed - No data to display  Imaging Review No results found.   EKG Interpretation None      MDM   Final diagnoses:  Insect sting, accidental or unintentional, initial encounter    Patient's been completely asymptomatic during his ED stay. His never developed any symptoms prior to or during ED that would be consistent with anaphylaxis. The only abnormality is on exam or a local insect staying in the mark from his EpiPen injection. Given that he had no symptoms of palpitations or other signs after his appendectomy injection I question whether he even got epinephrine. He was watched in the ED for 3 hours and had no recurrence of symptoms. As I have low suspicion for reflexes, and feeling be discharged without medications and followup  with his PCP. States he has 2 epi pens at home and does not need any refills at this time.    Ephraim Hamburger, MD 07/10/14 2107

## 2014-07-10 NOTE — Discharge Instructions (Signed)

## 2014-07-10 NOTE — ED Notes (Signed)
He continues to be in no distress and denies any trouble breathing, nor any throat swelling.

## 2014-07-10 NOTE — ED Notes (Signed)
Pt states that about 15-20 minutes ago, he was stung by a "wasp looking bug." Pt states he was stung once and used his EPI pen in his LT thigh. Pt states "I don't know if it worked, but I saw some blood." He also states "I held it down for 10 seconds, but did not see a needle." Pt is allergic to bees. Pt denies any SOB, dizziness, or faintness.

## 2014-12-07 DIAGNOSIS — I10 Essential (primary) hypertension: Secondary | ICD-10-CM | POA: Diagnosis not present

## 2014-12-07 DIAGNOSIS — R7301 Impaired fasting glucose: Secondary | ICD-10-CM | POA: Diagnosis not present

## 2014-12-07 DIAGNOSIS — N400 Enlarged prostate without lower urinary tract symptoms: Secondary | ICD-10-CM | POA: Diagnosis not present

## 2014-12-07 DIAGNOSIS — M109 Gout, unspecified: Secondary | ICD-10-CM | POA: Diagnosis not present

## 2014-12-08 DIAGNOSIS — H2513 Age-related nuclear cataract, bilateral: Secondary | ICD-10-CM | POA: Diagnosis not present

## 2014-12-14 DIAGNOSIS — E785 Hyperlipidemia, unspecified: Secondary | ICD-10-CM | POA: Diagnosis not present

## 2014-12-14 DIAGNOSIS — I2510 Atherosclerotic heart disease of native coronary artery without angina pectoris: Secondary | ICD-10-CM | POA: Diagnosis not present

## 2014-12-14 DIAGNOSIS — I10 Essential (primary) hypertension: Secondary | ICD-10-CM | POA: Diagnosis not present

## 2014-12-14 DIAGNOSIS — R7301 Impaired fasting glucose: Secondary | ICD-10-CM | POA: Diagnosis not present

## 2015-03-08 DIAGNOSIS — M109 Gout, unspecified: Secondary | ICD-10-CM | POA: Diagnosis not present

## 2015-03-08 DIAGNOSIS — R7301 Impaired fasting glucose: Secondary | ICD-10-CM | POA: Diagnosis not present

## 2015-03-08 DIAGNOSIS — I10 Essential (primary) hypertension: Secondary | ICD-10-CM | POA: Diagnosis not present

## 2015-03-15 DIAGNOSIS — E785 Hyperlipidemia, unspecified: Secondary | ICD-10-CM | POA: Diagnosis not present

## 2015-03-15 DIAGNOSIS — I2510 Atherosclerotic heart disease of native coronary artery without angina pectoris: Secondary | ICD-10-CM | POA: Diagnosis not present

## 2015-03-15 DIAGNOSIS — I10 Essential (primary) hypertension: Secondary | ICD-10-CM | POA: Diagnosis not present

## 2015-03-15 DIAGNOSIS — R7301 Impaired fasting glucose: Secondary | ICD-10-CM | POA: Diagnosis not present

## 2015-12-06 DIAGNOSIS — M109 Gout, unspecified: Secondary | ICD-10-CM | POA: Diagnosis not present

## 2015-12-06 DIAGNOSIS — Z125 Encounter for screening for malignant neoplasm of prostate: Secondary | ICD-10-CM | POA: Diagnosis not present

## 2015-12-06 DIAGNOSIS — R7301 Impaired fasting glucose: Secondary | ICD-10-CM | POA: Diagnosis not present

## 2015-12-06 DIAGNOSIS — I10 Essential (primary) hypertension: Secondary | ICD-10-CM | POA: Diagnosis not present

## 2015-12-26 DIAGNOSIS — Z0000 Encounter for general adult medical examination without abnormal findings: Secondary | ICD-10-CM | POA: Diagnosis not present

## 2016-02-09 DIAGNOSIS — Z0000 Encounter for general adult medical examination without abnormal findings: Secondary | ICD-10-CM | POA: Diagnosis not present

## 2016-02-09 DIAGNOSIS — Z23 Encounter for immunization: Secondary | ICD-10-CM | POA: Diagnosis not present

## 2016-02-09 DIAGNOSIS — Z1389 Encounter for screening for other disorder: Secondary | ICD-10-CM | POA: Diagnosis not present

## 2016-02-14 DIAGNOSIS — H2513 Age-related nuclear cataract, bilateral: Secondary | ICD-10-CM | POA: Diagnosis not present

## 2016-02-16 DIAGNOSIS — C44319 Basal cell carcinoma of skin of other parts of face: Secondary | ICD-10-CM | POA: Diagnosis not present

## 2016-02-16 DIAGNOSIS — D225 Melanocytic nevi of trunk: Secondary | ICD-10-CM | POA: Diagnosis not present

## 2016-02-16 DIAGNOSIS — Z85828 Personal history of other malignant neoplasm of skin: Secondary | ICD-10-CM | POA: Diagnosis not present

## 2016-02-16 DIAGNOSIS — D3612 Benign neoplasm of peripheral nerves and autonomic nervous system, upper limb, including shoulder: Secondary | ICD-10-CM | POA: Diagnosis not present

## 2016-02-16 DIAGNOSIS — C44619 Basal cell carcinoma of skin of left upper limb, including shoulder: Secondary | ICD-10-CM | POA: Diagnosis not present

## 2016-02-16 DIAGNOSIS — L570 Actinic keratosis: Secondary | ICD-10-CM | POA: Diagnosis not present

## 2016-02-29 DIAGNOSIS — C44619 Basal cell carcinoma of skin of left upper limb, including shoulder: Secondary | ICD-10-CM | POA: Diagnosis not present

## 2016-03-01 DIAGNOSIS — Z1211 Encounter for screening for malignant neoplasm of colon: Secondary | ICD-10-CM | POA: Diagnosis not present

## 2016-03-01 DIAGNOSIS — Z1212 Encounter for screening for malignant neoplasm of rectum: Secondary | ICD-10-CM | POA: Diagnosis not present

## 2016-03-15 DIAGNOSIS — C44319 Basal cell carcinoma of skin of other parts of face: Secondary | ICD-10-CM | POA: Diagnosis not present

## 2016-08-07 DIAGNOSIS — R7309 Other abnormal glucose: Secondary | ICD-10-CM | POA: Diagnosis not present

## 2016-08-07 DIAGNOSIS — I129 Hypertensive chronic kidney disease with stage 1 through stage 4 chronic kidney disease, or unspecified chronic kidney disease: Secondary | ICD-10-CM | POA: Diagnosis not present

## 2016-08-07 DIAGNOSIS — M109 Gout, unspecified: Secondary | ICD-10-CM | POA: Diagnosis not present

## 2016-08-07 DIAGNOSIS — N400 Enlarged prostate without lower urinary tract symptoms: Secondary | ICD-10-CM | POA: Diagnosis not present

## 2016-08-13 DIAGNOSIS — R7309 Other abnormal glucose: Secondary | ICD-10-CM | POA: Diagnosis not present

## 2016-08-13 DIAGNOSIS — N183 Chronic kidney disease, stage 3 (moderate): Secondary | ICD-10-CM | POA: Diagnosis not present

## 2016-08-13 DIAGNOSIS — I129 Hypertensive chronic kidney disease with stage 1 through stage 4 chronic kidney disease, or unspecified chronic kidney disease: Secondary | ICD-10-CM | POA: Diagnosis not present

## 2016-08-13 DIAGNOSIS — I209 Angina pectoris, unspecified: Secondary | ICD-10-CM | POA: Diagnosis not present

## 2016-12-04 DIAGNOSIS — M67911 Unspecified disorder of synovium and tendon, right shoulder: Secondary | ICD-10-CM | POA: Diagnosis not present

## 2016-12-04 DIAGNOSIS — M67912 Unspecified disorder of synovium and tendon, left shoulder: Secondary | ICD-10-CM | POA: Diagnosis not present

## 2017-01-07 ENCOUNTER — Emergency Department (HOSPITAL_COMMUNITY)
Admission: EM | Admit: 2017-01-07 | Discharge: 2017-01-07 | Disposition: A | Discharge status: Home / Self Care | Payer: Medicare Other | Attending: Emergency Medicine | Admitting: Emergency Medicine

## 2017-01-07 ENCOUNTER — Encounter (HOSPITAL_COMMUNITY): Payer: Self-pay | Admitting: *Deleted

## 2017-01-07 ENCOUNTER — Emergency Department (HOSPITAL_COMMUNITY): Payer: Medicare Other

## 2017-01-07 DIAGNOSIS — R079 Chest pain, unspecified: Secondary | ICD-10-CM | POA: Diagnosis present

## 2017-01-07 DIAGNOSIS — R112 Nausea with vomiting, unspecified: Secondary | ICD-10-CM | POA: Diagnosis not present

## 2017-01-07 DIAGNOSIS — I10 Essential (primary) hypertension: Secondary | ICD-10-CM | POA: Insufficient documentation

## 2017-01-07 DIAGNOSIS — Z955 Presence of coronary angioplasty implant and graft: Secondary | ICD-10-CM | POA: Insufficient documentation

## 2017-01-07 DIAGNOSIS — I2510 Atherosclerotic heart disease of native coronary artery without angina pectoris: Secondary | ICD-10-CM | POA: Insufficient documentation

## 2017-01-07 DIAGNOSIS — Z85828 Personal history of other malignant neoplasm of skin: Secondary | ICD-10-CM | POA: Insufficient documentation

## 2017-01-07 DIAGNOSIS — I252 Old myocardial infarction: Secondary | ICD-10-CM | POA: Insufficient documentation

## 2017-01-07 DIAGNOSIS — Z951 Presence of aortocoronary bypass graft: Secondary | ICD-10-CM | POA: Insufficient documentation

## 2017-01-07 DIAGNOSIS — K2100 Gastro-esophageal reflux disease with esophagitis, without bleeding: Secondary | ICD-10-CM

## 2017-01-07 DIAGNOSIS — Z79899 Other long term (current) drug therapy: Secondary | ICD-10-CM | POA: Diagnosis not present

## 2017-01-07 DIAGNOSIS — Z7982 Long term (current) use of aspirin: Secondary | ICD-10-CM | POA: Diagnosis not present

## 2017-01-07 LAB — COMPREHENSIVE METABOLIC PANEL
ALT: 19 U/L (ref 17–63)
AST: 26 U/L (ref 15–41)
Albumin: 3.7 g/dL (ref 3.5–5.0)
Alkaline Phosphatase: 67 U/L (ref 38–126)
Anion gap: 10 (ref 5–15)
BUN: 13 mg/dL (ref 6–20)
CO2: 23 mmol/L (ref 22–32)
CREATININE: 1.11 mg/dL (ref 0.61–1.24)
Calcium: 9.4 mg/dL (ref 8.9–10.3)
Chloride: 107 mmol/L (ref 101–111)
GFR calc Af Amer: >60 mL/min (ref >60)
GFR calc non Af Amer: >60 mL/min (ref >60)
Glucose, Bld: 130 mg/dL — ABNORMAL HIGH (ref 65–99)
Potassium: 3.9 mmol/L (ref 3.5–5.1)
SODIUM: 140 mmol/L (ref 135–145)
TOTAL PROTEIN: 6.8 g/dL (ref 6.5–8.1)
Total Bilirubin: 0.7 mg/dL (ref 0.3–1.2)

## 2017-01-07 LAB — CBC
HCT: 44.7 % (ref 39.0–52.0)
Hemoglobin: 15.4 g/dL (ref 13.0–17.0)
MCH: 31.8 pg (ref 26.0–34.0)
MCHC: 34.5 g/dL (ref 30.0–36.0)
MCV: 92.4 fL (ref 78.0–100.0)
PLATELETS: 187 10*3/uL (ref 150–400)
RBC: 4.84 MIL/uL (ref 4.22–5.81)
RDW: 12.8 % (ref 11.5–15.5)
WBC: 6.7 10*3/uL (ref 4.0–10.5)

## 2017-01-07 LAB — LIPASE, BLOOD: Lipase: 51 U/L (ref 11–51)

## 2017-01-07 LAB — TROPONIN I

## 2017-01-07 MED ORDER — OMEPRAZOLE 20 MG PO CPDR: 20.0000 mg | DELAYED_RELEASE_CAPSULE | Freq: Two times a day (BID) | ORAL | 1 refills | Status: DC

## 2017-01-07 NOTE — ED Provider Notes (Signed)
[facility] DEPT Provider Note   CSN: [id number] Arrival date & time: 01/07/17  [id number]     History   Chief Complaint Chief Complaint  Patient presents with  . Emesis    HPI Jose [name] is a 81 y.o. male.Chief complaint is emesis  HPI:  Mr. Puyear relates about 2 weeks of a feeling of nausea in his stomach after meals. Particularly in the [place] after coffee and juice in his medications. If he lays down or leans ovary feels nausea and will frequently regurgitate. He has burning to his chest and abdomen. States occasionally he will feel pain in the left lateral chest associated with it. The pain in his chest is relieved after he has regurgitation.  No hematemesis. No fever. No diarrhea. No new medications.  History of GERD "40 years ago". History of heart disease with coronary bypass grafting. He does not have pain that he relates that is in any way similar to his angina  Past Medical History:  Diagnosis Date  . Complication of anesthesia    "hard time waking up from it alot of times" (10/09/2013)  . Coronary artery disease    hx CABG 2011  . GERD (gastroesophageal reflux disease)    "not in >30 yr" (10/09/2013)  . Gout   . H/O cardiovascular stress test 2011     mild to mod ischemia this led to cath and CABG  . Hx of echocardiogram 2010   EF >55%, mild to mod MR, mild aortic reg.   Marland Kitchen Hyperlipidemia LDL goal <70   . Hypertension   . Kidney stone   . Myocardial infarction 1999; 2006   both acute inferior wall  . Skin cancer    "right nose, lower lip, right ankle, left side" (10/09/2013)  . Ventricular fibrillation ([place]) 2006   arrest with MI    Patient Active Problem List   Diagnosis Date Noted  . Chest pain 10/09/2013  . HTN (hypertension) 10/09/2013  . Coronary artery disease, hx of two inf. MIs one with v. fib arrest, stents to RCA and ultimately in 2011 CABG    . Hx of myocardial infarction   . Hyperlipidemia LDL goal <70   . Hx of ventricular fibrillation      Past Surgical History:  Procedure Laterality Date  . BACK SURGERY    . CORONARY ANGIOPLASTY WITH STENT PLACEMENT  1999   Stent to RCA  . CORONARY ANGIOPLASTY WITH STENT PLACEMENT  2006   stent to distal RCA  . CORONARY ANGIOPLASTY WITH STENT PLACEMENT  2006   mid LAD with Cypher stent  . CORONARY ARTERY BYPASS GRAFT  2011   LIMA-LAD; seq VG-1st & 2nd OM of LCX; VG-PDA of RCA  . CYSTOSCOPY     "had alot of laser for kidney stones" (10/09/2013)  . CYSTOSCOPY W/ STONE MANIPULATION  12/2003  . I&D EXTREMITY  06/18/2012   Procedure: IRRIGATION AND DEBRIDEMENT EXTREMITY;  Surgeon: Linna Hoff, MD;  Location: [facility];  Service: Orthopedics;  Laterality: Left;  I & D left thumb   . LITHOTRIPSY     "several times" (10/09/2013)  . [place]   "disc ruptured" (10/09/2013)  . SKIN CANCER EXCISION     "right nose, lower lip; right ankle, left side" (10/09/2013)       Home Medications    Prior to Admission medications   Medication Sig Start Date End Date Taking? Authorizing Provider  amLODipine (NORVASC) 2.5 MG tablet Take 2.5 mg by  mouth daily.    Historical Provider, MD  aspirin EC 81 MG tablet Take 81 mg by mouth daily.    Historical Provider, MD  atenolol (TENORMIN) 25 MG tablet Take 0.5 tablets (12.5 mg total) by mouth 2 (two) times daily. 10/11/13   Ripudeep Krystal Eaton, MD  EPINEPHrine (EPIPEN) 0.3 mg/0.3 mL SOAJ injection Inject 0.3 mg into the muscle once as needed (allergic reaction).    Historical Provider, MD  fish oil-omega-3 fatty acids 1000 MG capsule Take 1 g by mouth daily.    Historical Provider, MD  omeprazole (PRILOSEC) 20 MG capsule Take 1 capsule (20 mg total) by mouth 2 (two) times daily. 01/07/17   Tanna Furry, MD  simvastatin (ZOCOR) 20 MG tablet Take 20 mg by mouth every evening.    Historical Provider, MD    Family History No family history on file.  Social History Social History  Substance Use Topics  . Smoking status: Never Smoker  . Smokeless  tobacco: Never Used  . Alcohol use Yes     Comment: 10/09/2013 "I drink a beer occasionally; not regular"     Allergies   Yellow jacket venom [bee venom]; Benicar [olmesartan]; Cozaar [losartan]; Plavix [clopidogrel]; Relafen [nabumetone]; Statins; and Tekturna [aliskiren]   Review of Systems Review of Systems  Constitutional: Negative for appetite change, chills, diaphoresis, fatigue and fever.  HENT: Negative for mouth sores, sore throat and trouble swallowing.   Eyes: Negative for visual disturbance.  Respiratory: Negative for cough, chest tightness, shortness of breath and wheezing.   Cardiovascular: Positive for chest pain.  Gastrointestinal: Positive for abdominal pain, nausea and vomiting. Negative for abdominal distention and diarrhea.  Endocrine: Negative for polydipsia, polyphagia and polyuria.  Genitourinary: Negative for dysuria, frequency and hematuria.  Musculoskeletal: Negative for gait problem.  Skin: Negative for color change, pallor and rash.  Neurological: Negative for dizziness, syncope, light-headedness and headaches.  Hematological: Does not bruise/bleed easily.  Psychiatric/Behavioral: Negative for behavioral problems and confusion.     Physical Exam Updated Vital Signs BP 155/73   Pulse (!) 50   Temp 98.5 F (36.9 C) (Oral)   Resp 18   Ht 5\' 11"  (1.803 m)   Wt 190 lb (86.2 kg)   SpO2 95%   BMI 26.50 kg/m   Physical Exam  Constitutional: He is oriented to person, place, and time. He appears well-developed and well-nourished. No distress.  HENT:  Head: Normocephalic.  Eyes: Conjunctivae are normal. Pupils are equal, round, and reactive to light. No scleral icterus.  Neck: Normal range of motion. Neck supple. No thyromegaly present.  Cardiovascular: Normal rate and regular rhythm.  Exam reveals no gallop and no friction rub.   No murmur heard. Pulmonary/Chest: Effort normal and breath sounds normal. No respiratory distress. He has no wheezes. He  has no rales.  Abdominal: Soft. Bowel sounds are normal. He exhibits no distension. There is no tenderness. There is no rebound.  Benign abdomen. No guarding or rebound.  Musculoskeletal: Normal range of motion.  Neurological: He is alert and oriented to person, place, and time.  Skin: Skin is warm and dry. No rash noted.  Psychiatric: He has a normal mood and affect. His behavior is normal.     ED Treatments / Results  Labs (all labs ordered are listed, but only abnormal results are displayed) Labs Reviewed  COMPREHENSIVE METABOLIC PANEL - Abnormal; Notable for the following:       Result Value   Glucose, Bld 130 (*)    All  other components within normal limits  LIPASE, BLOOD  CBC  TROPONIN I    EKG  EKG Interpretation None       Radiology Dg Chest 2 View  Result Date: 01/07/2017 CLINICAL DATA:  One week of nausea and vomiting. History of coronary artery disease and CABG, kidney stones, hypertension. EXAM: CHEST  2 VIEW COMPARISON:  PA and lateral chest x-ray of October 09, 2013 FINDINGS: The lungs are well-expanded. There is no focal infiltrate. There is no pleural effusion. The heart and pulmonary vascularity are normal. The mediastinum is normal in width. The patient has undergone previous CABG. There is calcification in the wall of the aortic arch. The bony thorax exhibits no acute abnormality. IMPRESSION: There is no acute cardiopulmonary abnormality.  Previous CABG. Thoracic aortic atherosclerosis. Electronically Signed   By: David  Martinique M.D.   On: 01/07/2017 11:59    Procedures Procedures (including critical care time)  Medications Ordered in ED Medications - No data to display   Initial Impression / Assessment and Plan / ED Course  I have reviewed the triage vital signs and the nursing notes.  Pertinent labs & imaging results that were available during my care of the patient were reviewed by me and considered in my medical decision making (see chart for  details).     Chest x-ray, labs, EKG showed no acute process. The concern is pain represents angina or cardiac disease. He is well oxygenated has clear lungs. His hepatobiliary and pancreatic enzymes are normal. Symptoms are 100% consistent with reflux and esophagitis. He'll avoid fruit and fruit juices. Avoid large meals. Yard he drinks decaf. Avoid anti-inflammatories. No alcohol. Primary care follow-up. Twice a day Prilosec until resolution.  Final Clinical Impressions(s) / ED Diagnoses   Final diagnoses:  Gastroesophageal reflux disease with esophagitis    New Prescriptions New Prescriptions   OMEPRAZOLE (PRILOSEC) 20 MG CAPSULE    Take 1 capsule (20 mg total) by mouth 2 (two) times daily.     Tanna Furry, MD 01/07/17 1320

## 2017-01-07 NOTE — ED Triage Notes (Addendum)
Pt states emesis for last 2 weeks, accompanied by runny nose.  States when he bends over, he regurgitates.  States oc pain below L arm pit, but denies that presently.

## 2017-01-07 NOTE — Discharge Instructions (Signed)
Prilosec twice per day until your symptoms have improved or resolved  Small meals. No food within 2 hours of laying down.  Avoid foods and fruit juices, vegetables vegetable juices read these can contain acids.

## 2017-02-04 DIAGNOSIS — I129 Hypertensive chronic kidney disease with stage 1 through stage 4 chronic kidney disease, or unspecified chronic kidney disease: Secondary | ICD-10-CM | POA: Diagnosis not present

## 2017-02-04 DIAGNOSIS — Z125 Encounter for screening for malignant neoplasm of prostate: Secondary | ICD-10-CM | POA: Diagnosis not present

## 2017-02-04 DIAGNOSIS — M109 Gout, unspecified: Secondary | ICD-10-CM | POA: Diagnosis not present

## 2017-02-04 DIAGNOSIS — I10 Essential (primary) hypertension: Secondary | ICD-10-CM | POA: Diagnosis not present

## 2017-02-04 DIAGNOSIS — R7309 Other abnormal glucose: Secondary | ICD-10-CM | POA: Diagnosis not present

## 2017-02-06 DIAGNOSIS — H2513 Age-related nuclear cataract, bilateral: Secondary | ICD-10-CM | POA: Diagnosis not present

## 2017-02-11 DIAGNOSIS — Z0000 Encounter for general adult medical examination without abnormal findings: Secondary | ICD-10-CM | POA: Diagnosis not present

## 2017-02-11 DIAGNOSIS — E7800 Pure hypercholesterolemia, unspecified: Secondary | ICD-10-CM | POA: Diagnosis not present

## 2017-02-11 DIAGNOSIS — I10 Essential (primary) hypertension: Secondary | ICD-10-CM | POA: Diagnosis not present

## 2017-03-06 DIAGNOSIS — D692 Other nonthrombocytopenic purpura: Secondary | ICD-10-CM | POA: Diagnosis not present

## 2017-03-06 DIAGNOSIS — L570 Actinic keratosis: Secondary | ICD-10-CM | POA: Diagnosis not present

## 2017-03-06 DIAGNOSIS — D485 Neoplasm of uncertain behavior of skin: Secondary | ICD-10-CM | POA: Diagnosis not present

## 2017-03-06 DIAGNOSIS — L918 Other hypertrophic disorders of the skin: Secondary | ICD-10-CM | POA: Diagnosis not present

## 2017-03-06 DIAGNOSIS — Z85828 Personal history of other malignant neoplasm of skin: Secondary | ICD-10-CM | POA: Diagnosis not present

## 2017-03-06 DIAGNOSIS — L821 Other seborrheic keratosis: Secondary | ICD-10-CM | POA: Diagnosis not present

## 2017-04-10 ENCOUNTER — Telehealth: Payer: Self-pay

## 2017-05-03 DIAGNOSIS — L03115 Cellulitis of right lower limb: Secondary | ICD-10-CM | POA: Diagnosis not present

## 2017-05-03 DIAGNOSIS — S91309A Unspecified open wound, unspecified foot, initial encounter: Secondary | ICD-10-CM | POA: Diagnosis not present

## 2017-05-03 DIAGNOSIS — S91009A Unspecified open wound, unspecified ankle, initial encounter: Secondary | ICD-10-CM | POA: Diagnosis not present

## 2017-05-08 DIAGNOSIS — L03115 Cellulitis of right lower limb: Secondary | ICD-10-CM | POA: Diagnosis not present

## 2017-05-10 DIAGNOSIS — L03115 Cellulitis of right lower limb: Secondary | ICD-10-CM | POA: Diagnosis not present

## 2017-06-04 DIAGNOSIS — E7800 Pure hypercholesterolemia, unspecified: Secondary | ICD-10-CM | POA: Diagnosis not present

## 2017-06-04 DIAGNOSIS — E119 Type 2 diabetes mellitus without complications: Secondary | ICD-10-CM | POA: Diagnosis not present

## 2017-06-10 DIAGNOSIS — I2510 Atherosclerotic heart disease of native coronary artery without angina pectoris: Secondary | ICD-10-CM | POA: Diagnosis not present

## 2017-06-10 DIAGNOSIS — E7800 Pure hypercholesterolemia, unspecified: Secondary | ICD-10-CM | POA: Diagnosis not present

## 2017-06-10 DIAGNOSIS — K219 Gastro-esophageal reflux disease without esophagitis: Secondary | ICD-10-CM | POA: Diagnosis not present

## 2017-06-10 DIAGNOSIS — I10 Essential (primary) hypertension: Secondary | ICD-10-CM | POA: Diagnosis not present

## 2017-08-29 DIAGNOSIS — M79672 Pain in left foot: Secondary | ICD-10-CM | POA: Diagnosis not present

## 2017-08-31 ENCOUNTER — Encounter (HOSPITAL_COMMUNITY): Payer: Self-pay | Admitting: *Deleted

## 2017-08-31 ENCOUNTER — Emergency Department (HOSPITAL_COMMUNITY)
Admission: EM | Admit: 2017-08-31 | Discharge: 2017-08-31 | Disposition: A | Discharge status: Home / Self Care | Payer: Medicare Other | Attending: Emergency Medicine | Admitting: Emergency Medicine

## 2017-08-31 DIAGNOSIS — R404 Transient alteration of awareness: Secondary | ICD-10-CM | POA: Diagnosis not present

## 2017-08-31 DIAGNOSIS — I2510 Atherosclerotic heart disease of native coronary artery without angina pectoris: Secondary | ICD-10-CM | POA: Diagnosis not present

## 2017-08-31 DIAGNOSIS — Z7982 Long term (current) use of aspirin: Secondary | ICD-10-CM | POA: Diagnosis not present

## 2017-08-31 DIAGNOSIS — Z85828 Personal history of other malignant neoplasm of skin: Secondary | ICD-10-CM | POA: Diagnosis not present

## 2017-08-31 DIAGNOSIS — Z79899 Other long term (current) drug therapy: Secondary | ICD-10-CM | POA: Insufficient documentation

## 2017-08-31 DIAGNOSIS — I10 Essential (primary) hypertension: Secondary | ICD-10-CM | POA: Diagnosis not present

## 2017-08-31 DIAGNOSIS — Z951 Presence of aortocoronary bypass graft: Secondary | ICD-10-CM | POA: Insufficient documentation

## 2017-08-31 DIAGNOSIS — Z955 Presence of coronary angioplasty implant and graft: Secondary | ICD-10-CM | POA: Diagnosis not present

## 2017-08-31 DIAGNOSIS — R42 Dizziness and giddiness: Secondary | ICD-10-CM | POA: Diagnosis not present

## 2017-08-31 LAB — BASIC METABOLIC PANEL
ANION GAP: 12 (ref 5–15)
BUN: 18 mg/dL (ref 6–20)
CALCIUM: 9.5 mg/dL (ref 8.9–10.3)
CO2: 20 mmol/L — AB (ref 22–32)
Chloride: 105 mmol/L (ref 101–111)
Creatinine, Ser: 1.03 mg/dL (ref 0.61–1.24)
GFR calc Af Amer: >60 mL/min (ref >60)
GFR calc non Af Amer: >60 mL/min (ref >60)
Glucose, Bld: 186 mg/dL — ABNORMAL HIGH (ref 65–99)
POTASSIUM: 4.3 mmol/L (ref 3.5–5.1)
Sodium: 137 mmol/L (ref 135–145)

## 2017-08-31 LAB — CBC
HCT: 43.4 % (ref 39.0–52.0)
HEMOGLOBIN: 15.3 g/dL (ref 13.0–17.0)
MCH: 31.8 pg (ref 26.0–34.0)
MCHC: 35.3 g/dL (ref 30.0–36.0)
MCV: 90.2 fL (ref 78.0–100.0)
Platelets: 226 10*3/uL (ref 150–400)
RBC: 4.81 MIL/uL (ref 4.22–5.81)
RDW: 12.5 % (ref 11.5–15.5)
WBC: 11.1 10*3/uL — ABNORMAL HIGH (ref 4.0–10.5)

## 2017-08-31 LAB — URINALYSIS, ROUTINE W REFLEX MICROSCOPIC
BACTERIA UA: NONE SEEN
Bilirubin Urine: NEGATIVE
GLUCOSE, UA: 50 mg/dL — AB
HGB URINE DIPSTICK: NEGATIVE
KETONES UR: 5 mg/dL — AB
Leukocytes, UA: NEGATIVE
NITRITE: NEGATIVE
PROTEIN: NEGATIVE mg/dL
Specific Gravity, Urine: 1.012 (ref 1.005–1.030)
pH: 8 (ref 5.0–8.0)

## 2017-08-31 LAB — I-STAT TROPONIN, ED: Troponin i, poc: 0.01 ng/mL (ref 0.00–0.08)

## 2017-08-31 MED ORDER — ONDANSETRON 8 MG PO TBDP: 8.0000 mg | ORAL_TABLET | Freq: Three times a day (TID) | ORAL | 0 refills | Status: DC | PRN

## 2017-08-31 MED ORDER — LORAZEPAM 2 MG/ML IJ SOLN
0.5000 mg | Freq: Once | INTRAMUSCULAR | Status: AC
  Administered 2017-08-31: 0.5 mg via INTRAVENOUS
  Filled 2017-08-31: qty 1

## 2017-08-31 MED ORDER — SODIUM CHLORIDE 0.9 % IV BOLUS (SEPSIS)
1000.0000 mL | Freq: Once | INTRAVENOUS | Status: AC
  Administered 2017-08-31: 1000 mL via INTRAVENOUS

## 2017-08-31 MED ORDER — ONDANSETRON HCL 4 MG/2ML IJ SOLN
4.0000 mg | Freq: Once | INTRAMUSCULAR | Status: AC
  Administered 2017-08-31: 4 mg via INTRAVENOUS
  Filled 2017-08-31: qty 2

## 2017-08-31 MED ORDER — MECLIZINE HCL 25 MG PO TABS: 25.0000 mg | ORAL_TABLET | Freq: Three times a day (TID) | ORAL | 0 refills | Status: DC | PRN

## 2017-08-31 NOTE — ED Triage Notes (Signed)
Pt c/o dizziness onset today worse with movement, pt diaphoretic post vomiting episode onset today in route, pt denies CP, SOB, n/v/d, A&O x4

## 2017-08-31 NOTE — ED Provider Notes (Signed)
[facility] DEPT Provider Note   CSN: [id number] Arrival date & time: 08/31/17  1333     History   Chief Complaint Chief Complaint  Patient presents with  . Dizziness  . Emesis    HPI Jose [name] is a 81 y.o. male.  HPI Patient is a 81 year old male presents emergency department complaining of dizziness and spinning sensation that is worse with movements of the head.  He reports nausea and vomiting.  Denies chest pain or chest tightness.  Denies abdominal pain.  No weakness of his arms or legs.  No difficulty with speech.  No headache at this time.  He's had these symptoms intermittently before in the past this is the most severe.  He states also history of ringing in his ears and history of "inner ear issues".  His symptoms are moderate in severity   Past Medical History:  Diagnosis Date  . Complication of anesthesia    "hard time waking up from it alot of times" (10/09/2013)  . Coronary artery disease    hx CABG 2011  . GERD (gastroesophageal reflux disease)    "not in >30 yr" (10/09/2013)  . Gout   . H/O cardiovascular stress test 2011     mild to mod ischemia this led to cath and CABG  . Hx of echocardiogram 2010   EF >55%, mild to mod MR, mild aortic reg.   Marland Kitchen Hyperlipidemia LDL goal <70   . Hypertension   . Kidney stone   . Myocardial infarction ([place]) 1999; 2006   both acute inferior wall  . Skin cancer    "right nose, lower lip, right ankle, left side" (10/09/2013)  . Ventricular fibrillation ([place]) 2006   arrest with MI    Patient Active Problem List   Diagnosis Date Noted  . Chest pain 10/09/2013  . HTN (hypertension) 10/09/2013  . Coronary artery disease, hx of two inf. MIs one with v. fib arrest, stents to RCA and ultimately in 2011 CABG    . Hx of myocardial infarction   . Hyperlipidemia LDL goal <70   . Hx of ventricular fibrillation     Past Surgical History:  Procedure Laterality Date  . BACK SURGERY    . CORONARY ANGIOPLASTY WITH STENT  PLACEMENT  1999   Stent to RCA  . CORONARY ANGIOPLASTY WITH STENT PLACEMENT  2006   stent to distal RCA  . CORONARY ANGIOPLASTY WITH STENT PLACEMENT  2006   mid LAD with Cypher stent  . CORONARY ARTERY BYPASS GRAFT  2011   LIMA-LAD; seq VG-1st & 2nd OM of LCX; VG-PDA of RCA  . CYSTOSCOPY     "had alot of laser for kidney stones" (10/09/2013)  . CYSTOSCOPY W/ STONE MANIPULATION  12/2003  . I&D EXTREMITY  06/18/2012   Procedure: IRRIGATION AND DEBRIDEMENT EXTREMITY;  Surgeon: Linna Hoff, MD;  Location: [facility];  Service: Orthopedics;  Laterality: Left;  I & D left thumb   . LITHOTRIPSY     "several times" (10/09/2013)  . [place]   "disc ruptured" (10/09/2013)  . SKIN CANCER EXCISION     "right nose, lower lip; right ankle, left side" (10/09/2013)       Home Medications    Prior to Admission medications   Medication Sig Start Date End Date Taking? Authorizing Provider  amLODipine (NORVASC) 2.5 MG tablet Take 2.5 mg by mouth daily.    [provider]  aspirin EC 81 MG tablet Take 81 mg by  mouth daily.    [provider]  atenolol (TENORMIN) 25 MG tablet Take 0.5 tablets (12.5 mg total) by mouth 2 (two) times daily. 10/11/13   Rai, Ripudeep Raliegh Ip, MD  EPINEPHrine (EPIPEN) 0.3 mg/0.3 mL SOAJ injection Inject 0.3 mg into the muscle once as needed (allergic reaction).    [provider]  fish oil-omega-3 fatty acids 1000 MG capsule Take 1 g by mouth daily.    [provider]  omeprazole (PRILOSEC) 20 MG capsule Take 1 capsule (20 mg total) by mouth 2 (two) times daily. 01/07/17   Tanna Furry, MD  simvastatin (ZOCOR) 20 MG tablet Take 20 mg by mouth every evening.    [provider]    Family History No family history on file.  Social History Social History  Substance Use Topics  . Smoking status: Never Smoker  . Smokeless tobacco: Never Used  . Alcohol use Yes     Comment: 10/09/2013 "I drink a beer occasionally; not regular"      Allergies   Yellow jacket venom [bee venom]; Benicar [olmesartan]; Cozaar [losartan]; Plavix [clopidogrel]; Relafen [nabumetone]; Statins; and Tekturna [aliskiren]   Review of Systems Review of Systems  All other systems reviewed and are negative.    Physical Exam Updated Vital Signs BP (!) 169/63   Pulse (!) 53   Resp 20   SpO2 95%   Physical Exam  Constitutional: He is oriented to person, place, and time. He appears well-developed and well-nourished.  HENT:  Head: Normocephalic and atraumatic.  Eyes: Pupils are equal, round, and reactive to light. EOM are normal.  Neck: Normal range of motion.  Cardiovascular: Normal rate, regular rhythm and normal heart sounds.   Pulmonary/Chest: Effort normal and breath sounds normal. No respiratory distress.  Abdominal: Soft. He exhibits no distension. There is no tenderness.  Musculoskeletal: Normal range of motion.  Neurological: He is alert and oriented to person, place, and time.  5/5 strength in major muscle groups of  bilateral upper and lower extremities. Speech normal. No facial asymetry.   Skin: Skin is warm and dry.  Psychiatric: He has a normal mood and affect. Judgment normal.  Nursing note and vitals reviewed.    ED Treatments / Results  Labs (all labs ordered are listed, but only abnormal results are displayed) Labs Reviewed  BASIC METABOLIC PANEL - Abnormal; Notable for the following:       Result Value   CO2 20 (*)    Glucose, Bld 186 (*)    All other components within normal limits  CBC - Abnormal; Notable for the following:    WBC 11.1 (*)    All other components within normal limits  URINALYSIS, ROUTINE W REFLEX MICROSCOPIC  I-STAT TROPONIN, ED  CBG MONITORING, ED    EKG  EKG Interpretation  Date/Time:  Saturday August 31 2017 13:36:30 EDT Ventricular Rate:  55 PR Interval:    QRS Duration: 105 QT Interval:  484 QTC Calculation: 463 R Axis:   -32 Text Interpretation:  Sinus rhythm  Multiple ventricular premature complexes LVH with secondary repolarization abnormality Baseline wander in lead(s) II V6 No significant change was found Confirmed by Jola Schmidt [phone]) on 08/31/2017 2:02:54 PM       Radiology No results found.  Procedures Procedures (including critical care time)  Medications Ordered in ED Medications  ondansetron (ZOFRAN) injection 4 mg (not administered)  sodium chloride 0.9 % bolus 1,000 mL (not administered)  LORazepam (ATIVAN) injection 0.5 mg (not administered)  Initial Impression / Assessment and Plan / ED Course  I have reviewed the triage vital signs and the nursing notes.  Pertinent labs & imaging results that were available during my care of the patient were reviewed by me and considered in my medical decision making (see chart for details).    4:50 PM Symptoms resolved at this time.  Ambulatory in the ER.  Nonfocal neuro exam.  Suspect peripheral vertigo.  Home with meclizine and Zofran.  Patient stands return to ER for new or worsening symptoms  Final Clinical Impressions(s) / ED Diagnoses   Final diagnoses:  Vertigo    New Prescriptions New Prescriptions   No medications on file     Jola Schmidt, MD 08/31/17 1650

## 2017-08-31 NOTE — ED Notes (Addendum)
Pt ambulatory in hallway, mostly steady gait; slightly unsteady when turning; denies dizziness, states that he is "feeling better"

## 2017-11-28 NOTE — Telephone Encounter (Signed)
Opened in error

## 2017-12-05 DIAGNOSIS — E119 Type 2 diabetes mellitus without complications: Secondary | ICD-10-CM | POA: Diagnosis not present

## 2017-12-05 DIAGNOSIS — E7800 Pure hypercholesterolemia, unspecified: Secondary | ICD-10-CM | POA: Diagnosis not present

## 2017-12-11 DIAGNOSIS — K219 Gastro-esophageal reflux disease without esophagitis: Secondary | ICD-10-CM | POA: Diagnosis not present

## 2017-12-11 DIAGNOSIS — E7800 Pure hypercholesterolemia, unspecified: Secondary | ICD-10-CM | POA: Diagnosis not present

## 2017-12-11 DIAGNOSIS — E119 Type 2 diabetes mellitus without complications: Secondary | ICD-10-CM | POA: Diagnosis not present

## 2017-12-11 DIAGNOSIS — I10 Essential (primary) hypertension: Secondary | ICD-10-CM | POA: Diagnosis not present

## 2017-12-11 DIAGNOSIS — I2510 Atherosclerotic heart disease of native coronary artery without angina pectoris: Secondary | ICD-10-CM | POA: Diagnosis not present

## 2017-12-12 DIAGNOSIS — R42 Dizziness and giddiness: Secondary | ICD-10-CM | POA: Diagnosis not present

## 2018-02-13 ENCOUNTER — Encounter: Payer: Self-pay | Admitting: Cardiovascular Disease

## 2018-02-13 DIAGNOSIS — E039 Hypothyroidism, unspecified: Secondary | ICD-10-CM | POA: Diagnosis not present

## 2018-02-13 DIAGNOSIS — I10 Essential (primary) hypertension: Secondary | ICD-10-CM | POA: Diagnosis not present

## 2018-02-13 DIAGNOSIS — E785 Hyperlipidemia, unspecified: Secondary | ICD-10-CM | POA: Diagnosis not present

## 2018-02-13 DIAGNOSIS — Z125 Encounter for screening for malignant neoplasm of prostate: Secondary | ICD-10-CM | POA: Diagnosis not present

## 2018-02-20 DIAGNOSIS — I2510 Atherosclerotic heart disease of native coronary artery without angina pectoris: Secondary | ICD-10-CM | POA: Diagnosis not present

## 2018-02-20 DIAGNOSIS — E7800 Pure hypercholesterolemia, unspecified: Secondary | ICD-10-CM | POA: Diagnosis not present

## 2018-02-20 DIAGNOSIS — I10 Essential (primary) hypertension: Secondary | ICD-10-CM | POA: Diagnosis not present

## 2018-02-20 DIAGNOSIS — Z0000 Encounter for general adult medical examination without abnormal findings: Secondary | ICD-10-CM | POA: Diagnosis not present

## 2018-02-20 DIAGNOSIS — R972 Elevated prostate specific antigen [PSA]: Secondary | ICD-10-CM | POA: Diagnosis not present

## 2018-02-26 DIAGNOSIS — H2513 Age-related nuclear cataract, bilateral: Secondary | ICD-10-CM | POA: Diagnosis not present

## 2018-03-05 DIAGNOSIS — N4340 Spermatocele of epididymis, unspecified: Secondary | ICD-10-CM | POA: Diagnosis not present

## 2018-03-05 DIAGNOSIS — N401 Enlarged prostate with lower urinary tract symptoms: Secondary | ICD-10-CM | POA: Diagnosis not present

## 2018-03-05 DIAGNOSIS — R972 Elevated prostate specific antigen [PSA]: Secondary | ICD-10-CM | POA: Diagnosis not present

## 2018-03-05 DIAGNOSIS — R3915 Urgency of urination: Secondary | ICD-10-CM | POA: Diagnosis not present

## 2018-08-21 DIAGNOSIS — E7800 Pure hypercholesterolemia, unspecified: Secondary | ICD-10-CM | POA: Diagnosis not present

## 2018-08-26 DIAGNOSIS — I2510 Atherosclerotic heart disease of native coronary artery without angina pectoris: Secondary | ICD-10-CM | POA: Diagnosis not present

## 2018-08-26 DIAGNOSIS — K219 Gastro-esophageal reflux disease without esophagitis: Secondary | ICD-10-CM | POA: Diagnosis not present

## 2018-08-26 DIAGNOSIS — E785 Hyperlipidemia, unspecified: Secondary | ICD-10-CM | POA: Diagnosis not present

## 2018-08-26 DIAGNOSIS — I129 Hypertensive chronic kidney disease with stage 1 through stage 4 chronic kidney disease, or unspecified chronic kidney disease: Secondary | ICD-10-CM | POA: Diagnosis not present

## 2018-11-11 DIAGNOSIS — R2689 Other abnormalities of gait and mobility: Secondary | ICD-10-CM | POA: Diagnosis not present

## 2018-11-11 DIAGNOSIS — I10 Essential (primary) hypertension: Secondary | ICD-10-CM | POA: Diagnosis not present

## 2018-11-11 DIAGNOSIS — M25512 Pain in left shoulder: Secondary | ICD-10-CM | POA: Diagnosis not present

## 2018-11-11 DIAGNOSIS — M67912 Unspecified disorder of synovium and tendon, left shoulder: Secondary | ICD-10-CM | POA: Diagnosis not present

## 2018-11-12 DIAGNOSIS — I10 Essential (primary) hypertension: Secondary | ICD-10-CM | POA: Diagnosis not present

## 2018-12-11 DIAGNOSIS — H2513 Age-related nuclear cataract, bilateral: Secondary | ICD-10-CM | POA: Diagnosis not present

## 2018-12-17 DIAGNOSIS — H9313 Tinnitus, bilateral: Secondary | ICD-10-CM | POA: Diagnosis not present

## 2018-12-17 DIAGNOSIS — H903 Sensorineural hearing loss, bilateral: Secondary | ICD-10-CM | POA: Diagnosis not present

## 2018-12-17 DIAGNOSIS — R42 Dizziness and giddiness: Secondary | ICD-10-CM | POA: Diagnosis not present

## 2019-01-12 ENCOUNTER — Encounter: Payer: Self-pay | Admitting: Gastroenterology

## 2019-01-12 ENCOUNTER — Ambulatory Visit: Payer: Medicare Other | Admitting: Gastroenterology

## 2019-01-12 VITALS — BP 118/68 | HR 70 | Ht 70.0 in | Wt 188.0 lb

## 2019-01-12 DIAGNOSIS — K219 Gastro-esophageal reflux disease without esophagitis: Secondary | ICD-10-CM

## 2019-01-12 DIAGNOSIS — R112 Nausea with vomiting, unspecified: Secondary | ICD-10-CM

## 2019-01-12 DIAGNOSIS — R142 Eructation: Secondary | ICD-10-CM | POA: Diagnosis not present

## 2019-01-12 NOTE — Patient Instructions (Addendum)
You have been scheduled for an endoscopy. Please follow written instructions given to you at your visit today. If you use inhalers (even only as needed), please bring them with you on the day of your procedure.   STOP Vinegar  Take omeprazole 20 mg daily  You have been scheduled for an abdominal ultrasound at [facility] (1st floor of hospital) at [facility] on 01/20/2019 at 9:30am. Please arrive 15 minutes prior to your appointment for registration. Make certain not to have anything to eat or drink after midnight prior to your appointment. Should you need to reschedule your appointment, please contact radiology at [phone]. This test typically takes about 30 minutes to perform.   Gastroesophageal Reflux Disease, Adult Gastroesophageal reflux (GER) happens when acid from the stomach flows up into the tube that connects the mouth and the stomach (esophagus). Normally, food travels down the esophagus and stays in the stomach to be digested. However, when a person has GER, food and stomach acid sometimes move back up into the esophagus. If this becomes a more serious problem, the person may be diagnosed with a disease called gastroesophageal reflux disease (GERD). GERD occurs when the reflux:  Happens often.  Causes frequent or severe symptoms.  Causes problems such as damage to the esophagus. When stomach acid comes in contact with the esophagus, the acid may cause soreness (inflammation) in the esophagus. Over time, GERD may create small holes (ulcers) in the lining of the esophagus. What are the causes? This condition is caused by a problem with the muscle between the esophagus and the stomach (lower esophageal sphincter, or LES). Normally, the LES muscle closes after food passes through the esophagus to the stomach. When the LES is weakened or abnormal, it does not close properly, and that allows food and stomach acid to go back up into the esophagus. The LES can be  weakened by certain dietary substances, medicines, and medical conditions, including:  Tobacco use.  Pregnancy.  Having a hiatal hernia.  Alcohol use.  Certain foods and beverages, such as coffee, chocolate, onions, and peppermint. What increases the risk? You are more likely to develop this condition if you:  Have an increased body weight.  Have a connective tissue disorder.  Use NSAID medicines. What are the signs or symptoms? Symptoms of this condition include:  Heartburn.  Difficult or painful swallowing.  The feeling of having a lump in the throat.  Abitter taste in the mouth.  Bad breath.  Having a large amount of saliva.  Having an upset or bloated stomach.  Belching.  Chest pain. Different conditions can cause chest pain. Make sure you see your health care provider if you experience chest pain.  Shortness of breath or wheezing.  Ongoing (chronic) cough or a night-time cough.  Wearing away of tooth enamel.  Weight loss. How is this diagnosed? Your health care provider will take a medical history and perform a physical exam. To determine if you have mild or severe GERD, your health care provider may also monitor how you respond to treatment. You may also have tests, including:  A test to examine your stomach and esophagus with a small camera (endoscopy).  A test thatmeasures the acidity level in your esophagus.  A test thatmeasures how much pressure is on your esophagus.  A barium swallow or modified barium swallow test to show the shape, size, and functioning of your esophagus. How is this treated? The goal of treatment is to help relieve your symptoms and  to prevent complications. Treatment for this condition may vary depending on how severe your symptoms are. Your health care provider may recommend:  Changes to your diet.  Medicine.  Surgery. Follow these instructions at home: Eating and drinking   Follow a diet as recommended by your  health care provider. This may involve avoiding foods and drinks such as: ? Coffee and tea (with or without caffeine). ? Drinks that containalcohol. ? Energy drinks and sports drinks. ? Carbonated drinks or sodas. ? Chocolate and cocoa. ? Peppermint and mint flavorings. ? Garlic and onions. ? Horseradish. ? Spicy and acidic foods, including peppers, chili powder, curry powder, vinegar, hot sauces, and barbecue sauce. ? Citrus fruit juices and citrus fruits, such as oranges, lemons, and limes. ? Tomato-based foods, such as red sauce, chili, salsa, and pizza with red sauce. ? Fried and fatty foods, such as donuts, french fries, potato chips, and high-fat dressings. ? High-fat meats, such as hot dogs and fatty cuts of red and white meats, such as rib eye steak, sausage, ham, and bacon. ? High-fat dairy items, such as whole milk, butter, and cream cheese.  Eat small, frequent meals instead of large meals.  Avoid drinking large amounts of liquid with your meals.  Avoid eating meals during the 2-3 hours before bedtime.  Avoid lying down right after you eat.  Do not exercise right after you eat. Lifestyle   Do not use any products that contain nicotine or tobacco, such as cigarettes, e-cigarettes, and chewing tobacco. If you need help quitting, ask your health care provider.  Try to reduce your stress by using methods such as yoga or meditation. If you need help reducing stress, ask your health care provider.  If you are overweight, reduce your weight to an amount that is healthy for you. Ask your health care provider for guidance about a safe weight loss goal. General instructions  Pay attention to any changes in your symptoms.  Take over-the-counter and prescription medicines only as told by your health care provider. Do not take aspirin, ibuprofen, or other NSAIDs unless your health care provider told you to do so.  Wear loose-fitting clothing. Do not wear anything tight around  your waist that causes pressure on your abdomen.  Raise (elevate) the head of your bed about 6 inches (15 cm).  Avoid bending over if this makes your symptoms worse.  Keep all follow-up visits as told by your health care provider. This is important. Contact a health care provider if:  You have: ? New symptoms. ? Unexplained weight loss. ? Difficulty swallowing or it hurts to swallow. ? Wheezing or a persistent cough. ? A hoarse voice.  Your symptoms do not improve with treatment. Get help right away if you:  Have pain in your arms, neck, jaw, teeth, or back.  Feel sweaty, dizzy, or light-headed.  Have chest pain or shortness of breath.  Vomit and your vomit looks like blood or coffee grounds.  Faint.  Have stool that is bloody or black.  Cannot swallow, drink, or eat. Summary  Gastroesophageal reflux happens when acid from the stomach flows up into the esophagus. GERD is a disease in which the reflux happens often, causes frequent or severe symptoms, or causes problems such as damage to the esophagus.  Treatment for this condition may vary depending on how severe your symptoms are. Your health care provider may recommend diet and lifestyle changes, medicine, or surgery.  Contact a health care provider if you have new or  worsening symptoms.  Take over-the-counter and prescription medicines only as told by your health care provider. Do not take aspirin, ibuprofen, or other NSAIDs unless your health care provider told you to do so.  Keep all follow-up visits as told by your health care provider. This is important. This information is not intended to replace advice given to you by your health care provider. Make sure you discuss any questions you have with your health care provider. Document Released: 08/29/2005 Document Revised: 05/28/2018 Document Reviewed: 05/28/2018 Elsevier Interactive Patient Education  2019 [employer] for Gastroesophageal Reflux  Disease, Adult When you have gastroesophageal reflux disease (GERD), the foods you eat and your eating habits are very important. Choosing the right foods can help ease your discomfort. Think about working with a nutrition specialist (dietitian) to help you make good choices. What are tips for following this plan?  Meals  Choose healthy foods that are low in fat, such as fruits, vegetables, whole grains, low-fat dairy products, and lean meat, fish, and poultry.  Eat small meals often instead of 3 large meals a day. Eat your meals slowly, and in a place where you are relaxed. Avoid bending over or lying down until 2-3 hours after eating.  Avoid eating meals 2-3 hours before bed.  Avoid drinking a lot of liquid with meals.  Cook foods using methods other than frying. Bake, grill, or broil food instead.  Avoid or limit: ? Chocolate. ? Peppermint or spearmint. ? Alcohol. ? Pepper. ? Black and decaffeinated coffee. ? Black and decaffeinated tea. ? Bubbly (carbonated) soft drinks. ? Caffeinated energy drinks and soft drinks.  Limit high-fat foods such as: ? Fatty meat or fried foods. ? Whole milk, cream, butter, or ice cream. ? Nuts and nut butters. ? Pastries, donuts, and sweets made with butter or shortening.  Avoid foods that cause symptoms. These foods may be different for everyone. Common foods that cause symptoms include: ? Tomatoes. ? Oranges, lemons, and limes. ? Peppers. ? Spicy food. ? Onions and garlic. ? Vinegar. Lifestyle  Maintain a healthy weight. Ask your doctor what weight is healthy for you. If you need to lose weight, work with your doctor to do so safely.  Exercise for at least 30 minutes for 5 or more days each week, or as told by your doctor.  Wear loose-fitting clothes.  Do not smoke. If you need help quitting, ask your doctor.  Sleep with the head of your bed higher than your feet. Use a wedge under the mattress or blocks under the bed frame to raise  the head of the bed. Summary  When you have gastroesophageal reflux disease (GERD), food and lifestyle choices are very important in easing your symptoms.  Eat small meals often instead of 3 large meals a day. Eat your meals slowly, and in a place where you are relaxed.  Limit high-fat foods such as fatty meat or fried foods.  Avoid bending over or lying down until 2-3 hours after eating.  Avoid peppermint and spearmint, caffeine, alcohol, and chocolate. This information is not intended to replace advice given to you by your health care provider. Make sure you discuss any questions you have with your health care provider. Document Released: 05/20/2012 Document Revised: 12/25/2016 Document Reviewed: 12/25/2016 Elsevier Interactive Patient Education  2019 [employer].

## 2019-01-12 NOTE — Progress Notes (Signed)
Jose [name]    [id number]    07-23-1936  Primary Care Physician:Mackenzie, Aaron Edelman, MD (Inactive)  Referring Physician: No referring provider defined for this encounter.  Chief complaint:  GERD, belching, regurgitation  HPI: 83 year old male with history of CAD, hypertension, hyperlipidemia here with complaints of excessive belching and regurgitation/vomiting about an hour after a meal.  His symptoms are worse whenever he eats pizza, tomato sauce, pickles, lettuce or mayonnaise.  Taking daily Omeprazole with no improvementDenies any trouble swallowing. Intermittent heartburn, is taking apple cider vinegar capsules Occasional NSAID use. Ibuprofen for gout 800mg  once or twice a month. Advil 200mg  on and off.  Daily Aspirin 81 mg Denies any weight loss, decreased appetite, abdominal pain, melena or bright red blood per rectum    NM cardiac stress test:2014 No reversible ischemia or infarction EF 57%    Outpatient Encounter Medications as of 01/12/2019  Medication Sig  . amLODipine (NORVASC) 2.5 MG tablet Take 2.5 mg by mouth daily.  Marland Kitchen aspirin EC 81 MG tablet Take 81 mg by mouth daily.  Marland Kitchen atenolol (TENORMIN) 25 MG tablet Take 0.5 tablets (12.5 mg total) by mouth 2 (two) times daily.  Marland Kitchen EPINEPHrine (EPIPEN) 0.3 mg/0.3 mL SOAJ injection Inject 0.3 mg into the muscle once as needed (allergic reaction).  Marland Kitchen omeprazole (PRILOSEC) 20 MG capsule Take 1 capsule (20 mg total) by mouth 2 (two) times daily. (Patient taking differently: Take 20 mg by mouth daily. )  . ondansetron (ZOFRAN ODT) 8 MG disintegrating tablet Take 1 tablet (8 mg total) by mouth every 8 (eight) hours as needed for nausea or vomiting.  . pravastatin (PRAVACHOL) 10 MG tablet Take 10 mg by mouth at bedtime.   . [DISCONTINUED] fish oil-omega-3 fatty acids 1000 MG capsule Take 1 g by mouth daily.  . [DISCONTINUED] meclizine (ANTIVERT) 25 MG tablet Take 1 tablet (25 mg total) by mouth 3 (three) times daily as  needed for dizziness. (Patient not taking: Reported on 01/12/2019)  . [DISCONTINUED] predniSONE (STERAPRED UNI-PAK 21 TAB) 5 MG (21) TBPK tablet Take 5 mg by mouth daily.   No facility-administered encounter medications on file as of 01/12/2019.     Allergies as of 01/12/2019 - Review Complete 01/12/2019  Allergen Reaction Noted  . Yellow jacket venom [bee venom] Hives, Shortness Of Breath, and Rash 07/11/2013  . Benicar [olmesartan]  10/09/2013  . Cozaar [losartan] Other (See Comments)   . Plavix [clopidogrel] Other (See Comments) 05/13/2012  . Relafen [nabumetone]  10/09/2013  . Statins Other (See Comments) 10/09/2013  . Tekturna [aliskiren]  10/09/2013    Past Medical History:  Diagnosis Date  . Complication of anesthesia    "hard time waking up from it alot of times" (10/09/2013)  . Coronary artery disease    hx CABG 2011  . GERD (gastroesophageal reflux disease)    "not in >30 yr" (10/09/2013)  . Gout   . H/O cardiovascular stress test 2011     mild to mod ischemia this led to cath and CABG  . Hx of echocardiogram 2010   EF >55%, mild to mod MR, mild aortic reg.   Marland Kitchen Hyperlipidemia LDL goal <70   . Hypertension   . Kidney stone   . Myocardial infarction ([place]) 1999; 2006   both acute inferior wall  . Skin cancer    "right nose, lower lip, right ankle, left side" (10/09/2013)  . Ventricular fibrillation ([place]) 2006   arrest with MI  Past Surgical History:  Procedure Laterality Date  . BACK SURGERY    . CORONARY ANGIOPLASTY WITH STENT PLACEMENT  1999   Stent to RCA  . CORONARY ANGIOPLASTY WITH STENT PLACEMENT  2006   stent to distal RCA  . CORONARY ANGIOPLASTY WITH STENT PLACEMENT  2006   mid LAD with Cypher stent  . CORONARY ARTERY BYPASS GRAFT  2011   LIMA-LAD; seq VG-1st & 2nd OM of LCX; VG-PDA of RCA  . CYSTOSCOPY     "had alot of laser for kidney stones" (10/09/2013)  . CYSTOSCOPY W/ STONE MANIPULATION  12/2003  . I&D EXTREMITY  06/18/2012   Procedure:  IRRIGATION AND DEBRIDEMENT EXTREMITY;  Surgeon: Linna Hoff, MD;  Location: [place];  Service: Orthopedics;  Laterality: Left;  I & D left thumb   . LITHOTRIPSY     "several times" (10/09/2013)  . [place]   "disc ruptured" (10/09/2013)  . SKIN CANCER EXCISION     "right nose, lower lip; right ankle, left side" (10/09/2013)    Family History  Problem Relation Age of Onset  . Diabetes Mother   . Heart attack Father   . Colon cancer Sister   . Cancer Brother     Social History   Socioeconomic History  . Marital status: Married    Spouse name: Not on file  . Number of children: Not on file  . Years of education: Not on file  . Highest education level: Not on file  Occupational History  . Not on file  Social Needs  . Financial resource strain: Not on file  . Food insecurity:    Worry: Not on file    Inability: Not on file  . Transportation needs:    Medical: Not on file    Non-medical: Not on file  Tobacco Use  . Smoking status: Never Smoker  . Smokeless tobacco: Never Used  Substance and Sexual Activity  . Alcohol use: Yes    Comment: 10/09/2013 "I drink a beer occasionally; not regular"  . Drug use: No  . Sexual activity: Yes  Lifestyle  . Physical activity:    Days per week: Not on file    Minutes per session: Not on file  . Stress: Not on file  Relationships  . Social connections:    Talks on phone: Not on file    Gets together: Not on file    Attends religious service: Not on file    Active member of club or organization: Not on file    Attends meetings of clubs or organizations: Not on file    Relationship status: Not on file  . Intimate partner violence:    Fear of current or ex partner: Not on file    Emotionally abused: Not on file    Physically abused: Not on file    Forced sexual activity: Not on file  Other Topics Concern  . Not on file  Social History Narrative  . Not on file      Review of systems: Review of Systems    Constitutional: Negative for fever and chills.  HENT: Negative.   Eyes: Negative for blurred vision.  Respiratory: Negative for cough, shortness of breath and wheezing.   Cardiovascular: Negative for chest pain and palpitations.  Gastrointestinal: as per HPI Genitourinary: Negative for dysuria, urgency, frequency and hematuria.  Musculoskeletal: Negative for myalgias, back pain and joint pain.  Skin: Negative for itching and rash.  Neurological: Negative for dizziness, tremors, focal  weakness, seizures and loss of consciousness.  Endo/Heme/Allergies: Positive for seasonal allergies.  Psychiatric/Behavioral: Negative for depression, suicidal ideas and hallucinations.  All other systems reviewed and are negative.   Physical Exam: Vitals:   01/12/19 0944  BP: 118/68  Pulse: 70   Body mass index is 26.98 kg/m. Gen:      No acute distress HEENT:  EOMI, sclera anicteric Neck:     No masses; no thyromegaly Lungs:    Clear to auscultation bilaterally; normal respiratory effort CV:         Regular rate and rhythm; no murmurs Abd:      + bowel sounds; soft, non-tender; no palpable masses, no distension Ext:    No edema; adequate peripheral perfusion Skin:      Warm and dry; no rash Neuro: alert and oriented x 3 Psych: normal mood and affect  Data Reviewed:  Reviewed labs, radiology imaging, old records and pertinent past GI work up   Assessment and Plan/Recommendations:  69 yr M with persistent heartburn despite PPI with excessive belching, regurgitation and vomiting. Advised patient to stop taking apple cider vinegar capsules Omeprazole 20mg  daily Anti reflux measures Schedule for EGD to evaluate, exclude esophagitis, gastritis or peptic ulcer disease. The risks and benefits as well as alternatives of endoscopic procedure(s) have been discussed and reviewed. All questions answered. The patient agrees to proceed.      Damaris Hippo , MD [phone]    CC: No ref.  provider found

## 2019-01-14 ENCOUNTER — Encounter: Payer: Self-pay | Admitting: Gastroenterology

## 2019-01-16 ENCOUNTER — Other Ambulatory Visit: Payer: Self-pay | Admitting: *Deleted

## 2019-01-16 ENCOUNTER — Encounter: Payer: Self-pay | Admitting: Gastroenterology

## 2019-01-16 ENCOUNTER — Ambulatory Visit (AMBULATORY_SURGERY_CENTER): Payer: Medicare Other | Admitting: Gastroenterology

## 2019-01-16 VITALS — BP 121/66 | HR 48 | Temp 97.5°F | Resp 11 | Ht 70.0 in | Wt 188.0 lb

## 2019-01-16 DIAGNOSIS — K219 Gastro-esophageal reflux disease without esophagitis: Secondary | ICD-10-CM | POA: Diagnosis not present

## 2019-01-16 DIAGNOSIS — K222 Esophageal obstruction: Secondary | ICD-10-CM

## 2019-01-16 DIAGNOSIS — I2510 Atherosclerotic heart disease of native coronary artery without angina pectoris: Secondary | ICD-10-CM | POA: Diagnosis not present

## 2019-01-16 DIAGNOSIS — K449 Diaphragmatic hernia without obstruction or gangrene: Secondary | ICD-10-CM | POA: Diagnosis not present

## 2019-01-16 DIAGNOSIS — R112 Nausea with vomiting, unspecified: Secondary | ICD-10-CM

## 2019-01-16 MED ORDER — OMEPRAZOLE 20 MG PO CPDR: 20.0000 mg | DELAYED_RELEASE_CAPSULE | Freq: Every day | ORAL | 3 refills | Status: DC

## 2019-01-16 MED ORDER — SODIUM CHLORIDE 0.9 % IV SOLN: 500.0000 mL | Freq: Once | INTRAVENOUS | Status: DC

## 2019-01-16 NOTE — Progress Notes (Signed)
Pt's states no medical or surgical changes since previsit or office visit. 

## 2019-01-16 NOTE — Progress Notes (Signed)
Report to PACU, RN, vss, BBS= Clear.  

## 2019-01-16 NOTE — Patient Instructions (Addendum)
YOU HAD AN ENDOSCOPIC PROCEDURE TODAY AT [facility]:   Refer to the procedure report that was given to you for any specific questions about what was found during the examination.  If the procedure report does not answer your questions, please call your gastroenterologist to clarify.  If you requested that your care partner not be given the details of your procedure findings, then the procedure report has been included in a sealed envelope for you to review at your convenience later.  YOU SHOULD EXPECT: Some feelings of bloating in the abdomen. Passage of more gas than usual.  Walking can help get rid of the air that was put into your GI tract during the procedure and reduce the bloating. If you had a lower endoscopy (such as a colonoscopy or flexible sigmoidoscopy) you may notice spotting of blood in your stool or on the toilet paper. If you underwent a bowel prep for your procedure, you may not have a normal bowel movement for a few days.  Please Note:  You might notice some irritation and congestion in your nose or some drainage.  This is from the oxygen used during your procedure.  There is no need for concern and it should clear up in a day or so.  SYMPTOMS TO REPORT IMMEDIATELY:   Following upper endoscopy (EGD)  Vomiting of blood or coffee ground material  New chest pain or pain under the shoulder blades  Painful or persistently difficult swallowing  New shortness of breath  Fever of 100F or higher  Black, tarry-looking stools  For urgent or emergent issues, a gastroenterologist can be reached at any hour by calling [phone].   DIET:  We do recommend a small meal at first, but then you may proceed to your regular diet.  Drink plenty of fluids but you should avoid alcoholic beverages for 24 hours.  ACTIVITY:  You should plan to take it easy for the rest of today and you should NOT DRIVE or use heavy machinery until tomorrow (because of the sedation medicines used  during the test).    FOLLOW UP: Our staff will call the number listed on your records the next business day following your procedure to check on you and address any questions or concerns that you may have regarding the information given to you following your procedure. If we do not reach you, we will leave a message.  However, if you are feeling well and you are not experiencing any problems, there is no need to return our call.  We will assume that you have returned to your regular daily activities without incident.  If any biopsies were taken you will be contacted by phone or by letter within the next 1-3 weeks.  Please call us at [phone] if you have not heard about the biopsies in 3 weeks.    SIGNATURES/CONFIDENTIALITY: You and/or your care partner have signed paperwork which will be entered into your electronic medical record.  These signatures attest to the fact that that the information above on your After Visit Summary has been reviewed and is understood.  Full responsibility of the confidentiality of this discharge information lies with you and/or your care-partner.   Prilosec 20mg . One daily.  Follow anti reflux regimen.  See Dr. Silverio Decamp March 06, 2019 at [place].

## 2019-01-16 NOTE — Op Note (Addendum)
[place] Patient Name: Jose [name] Procedure Date: 01/16/2019 8:17 AM MRN: [id number] Endoscopist: Mauri Pole , MD Age: 83 Referring MD:  Date of Birth: 1935-12-17 Gender: Male Account #: [id number] Procedure:                Upper GI endoscopy Indications:              Persistent vomiting of unknown cause Medicines:                Monitored Anesthesia Care Procedure:                Pre-Anesthesia Assessment:                           - Prior to the procedure, a History and Physical                            was performed, and patient medications and                            allergies were reviewed. The patient's tolerance of                            previous anesthesia was also reviewed. The risks                            and benefits of the procedure and the sedation                            options and risks were discussed with the patient.                            All questions were answered, and informed consent                            was obtained. Prior Anticoagulants: The patient has                            taken no previous anticoagulant or antiplatelet                            agents. ASA Grade Assessment: II - A patient with                            mild systemic disease. After reviewing the risks                            and benefits, the patient was deemed in                            satisfactory condition to undergo the procedure.                           After obtaining informed consent, the endoscope was  passed under direct vision. Throughout the                            procedure, the patient's blood pressure, pulse, and                            oxygen saturations were monitored continuously. The                            Model GIF-HQ190 [id number]) scope was introduced                            through the mouth, and advanced to the second part                            of duodenum.  The upper GI endoscopy was                            accomplished without difficulty. The patient                            tolerated the procedure well. Scope In: Scope Out: Findings:                 A 4 cm hiatal hernia was present.                           One benign-appearing, intrinsic mild                            (non-circumferential scarring) stenosis was found                            34 to 35 cm from the incisors. This stenosis                            measured 1.8 cm (inner diameter). The stenosis was                            traversed. Otherwise normal exam with no ulcers or                            lesions.                           The Z-line was regular and was found 35 cm from the                            incisors.                           The stomach was normal.                           The examined duodenum was normal. Complications:  No immediate complications. Estimated Blood Loss:     Estimated blood loss was minimal. Impression:               - 4 cm hiatal hernia.                           - Benign-appearing esophageal stenosis.                           - Z-line regular, 35 cm from the incisors.                           - Normal stomach.                           - Normal examined duodenum.                           - No specimens collected. Recommendation:           - Patient has a contact number available for                            emergencies. The signs and symptoms of potential                            delayed complications were discussed with the                            patient. Return to normal activities tomorrow.                            Written discharge instructions were provided to the                            patient.                           - Resume previous diet.                           - Continue present medications.                           - Use Prilosec (omeprazole) 20 mg PO daily.                            - Follow an antireflux regimen.                           - Return in 2-3 months for follow up Mauri Pole, MD 01/16/2019 8:40:26 AM This report has been signed electronically.

## 2019-01-19 ENCOUNTER — Telehealth: Payer: Self-pay | Admitting: *Deleted

## 2019-01-19 NOTE — Telephone Encounter (Signed)
  Follow up Call-  Call back number 01/16/2019  Post procedure Call Back phone  # [phone]  Permission to leave phone message Yes  Some recent data might be hidden     Patient questions:  Do you have a fever, pain , or abdominal swelling? No. Pain Score  0 *  Have you tolerated food without any problems? Yes.    Have you been able to return to your normal activities? Yes.    Do you have any questions about your discharge instructions: Diet   No. Medications  No. Follow up visit  No.  Do you have questions or concerns about your Care? No.  Actions: * If pain score is 4 or above: No action needed, pain <4.

## 2019-01-20 ENCOUNTER — Ambulatory Visit (HOSPITAL_COMMUNITY): Payer: Medicare Other

## 2019-01-21 DIAGNOSIS — H527 Unspecified disorder of refraction: Secondary | ICD-10-CM | POA: Diagnosis not present

## 2019-01-29 DIAGNOSIS — H18413 Arcus senilis, bilateral: Secondary | ICD-10-CM | POA: Diagnosis not present

## 2019-01-29 DIAGNOSIS — H02834 Dermatochalasis of left upper eyelid: Secondary | ICD-10-CM | POA: Diagnosis not present

## 2019-01-29 DIAGNOSIS — H2511 Age-related nuclear cataract, right eye: Secondary | ICD-10-CM | POA: Diagnosis not present

## 2019-01-29 DIAGNOSIS — H2513 Age-related nuclear cataract, bilateral: Secondary | ICD-10-CM | POA: Diagnosis not present

## 2019-01-29 DIAGNOSIS — H25043 Posterior subcapsular polar age-related cataract, bilateral: Secondary | ICD-10-CM | POA: Diagnosis not present

## 2019-02-25 DIAGNOSIS — R7309 Other abnormal glucose: Secondary | ICD-10-CM | POA: Diagnosis not present

## 2019-02-25 DIAGNOSIS — Z0000 Encounter for general adult medical examination without abnormal findings: Secondary | ICD-10-CM | POA: Diagnosis not present

## 2019-02-25 DIAGNOSIS — I10 Essential (primary) hypertension: Secondary | ICD-10-CM | POA: Diagnosis not present

## 2019-02-25 DIAGNOSIS — E7800 Pure hypercholesterolemia, unspecified: Secondary | ICD-10-CM | POA: Diagnosis not present

## 2019-03-02 DIAGNOSIS — E7800 Pure hypercholesterolemia, unspecified: Secondary | ICD-10-CM | POA: Diagnosis not present

## 2019-03-02 DIAGNOSIS — K219 Gastro-esophageal reflux disease without esophagitis: Secondary | ICD-10-CM | POA: Diagnosis not present

## 2019-03-02 DIAGNOSIS — I2510 Atherosclerotic heart disease of native coronary artery without angina pectoris: Secondary | ICD-10-CM | POA: Diagnosis not present

## 2019-03-02 DIAGNOSIS — Z0000 Encounter for general adult medical examination without abnormal findings: Secondary | ICD-10-CM | POA: Diagnosis not present

## 2019-03-02 DIAGNOSIS — I10 Essential (primary) hypertension: Secondary | ICD-10-CM | POA: Diagnosis not present

## 2019-03-06 ENCOUNTER — Ambulatory Visit: Payer: Self-pay | Admitting: Gastroenterology

## 2019-03-09 ENCOUNTER — Telehealth: Payer: Self-pay | Admitting: Cardiovascular Disease

## 2019-03-09 NOTE — Telephone Encounter (Signed)
Noted. We need to try to contact patient for a new patient appointment since not seen since 2014.  Will route to scheduling.

## 2019-03-09 NOTE — Telephone Encounter (Signed)
Patient came in the office today and stated he was having some issues with low heart rate and ears ringing and dizziness.  States last Thursday his heart rated dropped to 44.  I told him I was going to get the nurse to come and speak with him, but he left before I could come back to my work station.

## 2019-03-24 ENCOUNTER — Telehealth: Payer: Self-pay

## 2019-03-24 NOTE — Telephone Encounter (Signed)
Left message for patient to contact office to give consent for upcoming virtual visit.  Spoke with spouse advised her to have patient give the office a call back when he returned home.

## 2019-03-27 ENCOUNTER — Telehealth: Payer: Medicare Other | Admitting: Cardiovascular Disease

## 2019-04-02 DIAGNOSIS — I10 Essential (primary) hypertension: Secondary | ICD-10-CM | POA: Diagnosis not present

## 2019-04-15 ENCOUNTER — Other Ambulatory Visit: Payer: Self-pay | Admitting: Gastroenterology

## 2019-04-15 DIAGNOSIS — H2513 Age-related nuclear cataract, bilateral: Secondary | ICD-10-CM | POA: Diagnosis not present

## 2019-04-15 DIAGNOSIS — H2511 Age-related nuclear cataract, right eye: Secondary | ICD-10-CM | POA: Diagnosis not present

## 2019-04-25 DIAGNOSIS — H1131 Conjunctival hemorrhage, right eye: Secondary | ICD-10-CM | POA: Diagnosis not present

## 2019-06-24 DIAGNOSIS — M67912 Unspecified disorder of synovium and tendon, left shoulder: Secondary | ICD-10-CM | POA: Diagnosis not present

## 2019-06-24 DIAGNOSIS — S46812A Strain of other muscles, fascia and tendons at shoulder and upper arm level, left arm, initial encounter: Secondary | ICD-10-CM | POA: Diagnosis not present

## 2019-07-09 DIAGNOSIS — I2510 Atherosclerotic heart disease of native coronary artery without angina pectoris: Secondary | ICD-10-CM | POA: Diagnosis not present

## 2019-07-09 DIAGNOSIS — I129 Hypertensive chronic kidney disease with stage 1 through stage 4 chronic kidney disease, or unspecified chronic kidney disease: Secondary | ICD-10-CM | POA: Diagnosis not present

## 2019-07-09 DIAGNOSIS — E7800 Pure hypercholesterolemia, unspecified: Secondary | ICD-10-CM | POA: Diagnosis not present

## 2019-07-09 DIAGNOSIS — R531 Weakness: Secondary | ICD-10-CM | POA: Diagnosis not present

## 2019-07-09 DIAGNOSIS — R7309 Other abnormal glucose: Secondary | ICD-10-CM | POA: Diagnosis not present

## 2019-07-09 DIAGNOSIS — E119 Type 2 diabetes mellitus without complications: Secondary | ICD-10-CM | POA: Diagnosis not present

## 2019-07-09 DIAGNOSIS — I10 Essential (primary) hypertension: Secondary | ICD-10-CM | POA: Diagnosis not present

## 2019-07-09 DIAGNOSIS — E531 Pyridoxine deficiency: Secondary | ICD-10-CM | POA: Diagnosis not present

## 2019-08-05 ENCOUNTER — Encounter: Payer: Self-pay | Admitting: Cardiovascular Disease

## 2019-08-05 ENCOUNTER — Ambulatory Visit (INDEPENDENT_AMBULATORY_CARE_PROVIDER_SITE_OTHER): Payer: Medicare Other | Admitting: Cardiovascular Disease

## 2019-08-05 ENCOUNTER — Other Ambulatory Visit: Payer: Self-pay

## 2019-08-05 VITALS — BP 139/76 | HR 62 | Temp 97.0°F | Ht 70.0 in | Wt 180.0 lb

## 2019-08-05 DIAGNOSIS — E785 Hyperlipidemia, unspecified: Secondary | ICD-10-CM

## 2019-08-05 DIAGNOSIS — Z8679 Personal history of other diseases of the circulatory system: Secondary | ICD-10-CM | POA: Diagnosis not present

## 2019-08-05 DIAGNOSIS — I2510 Atherosclerotic heart disease of native coronary artery without angina pectoris: Secondary | ICD-10-CM | POA: Diagnosis not present

## 2019-08-05 NOTE — Progress Notes (Signed)
Cardiology Office Note:    Date:  08/06/2019   ID:  Jose [name], DOB 1936-09-28, MRN [id number]  PCP:  Thressa Sheller, MD (Inactive)  Cardiologist:  No primary care provider on file.  Electrophysiologist:  None   Referring MD: No ref. provider found   Chief Complaint  Patient presents with  . Coronary Artery Disease  . Hyperlipidemia    History of Present Illness:    Jose [name] is a 83 y.o. male with a hx of coronary artery disease with history of previous myocardial infarction in 1999 and in 2006, the latter complicated by ventricular fibrillation cardiac arrest, previous stents to the right coronary artery and LAD artery, ultimately bypass surgery 2011 ([place], LIMA to LAD, SVG to PDA, sequential SVG to OM1-OM 2).  He has hypercholesterolemia and has been intolerant of potent statins, but able to take low-dose pravastatin.  This is his first cardiology evaluation since 2014, when he underwent a nuclear perfusion study that showed normal perfusion and EF 57%.  During telemetry monitoring he had short bursts of nonsustained VT, but on the subsequent outpatient 30-day event monitor no meaningful problems were identified..  His presentations with coronary events were rather atypical.  It sounds like the first time he had a syncopal event while playing golf and was referred for treadmill stress test which was abnormal.  This led to a stent in the right coronary artery.  In 2006 he presented with acute inferior myocardial infarction and ventricular fibrillation arrest and received another stent in the right coronary artery.  He denies ever experiencing chest pain, but looking back it sounds like he may have interscapular discomfort as his anginal equivalent.  He feels well and has no cardiovascular complaints.  His labs from February 25, 2019 show LDL cholesterol  was 92 and HDL was 30, but he reports having more recent labs performed at [facility] last week.  His  hemoglobin A1c was 5.5%.  The patient specifically denies any chest pain at rest or with exertion, dyspnea at rest or with exertion, orthopnea, paroxysmal nocturnal dyspnea, syncope, palpitations, focal neurological deficits, intermittent claudication, lower extremity edema, unexplained weight gain, cough, hemoptysis or wheezing.  The patient also denies abdominal pain, nausea, vomiting, dysphagia, diarrhea, constipation, polyuria, polydipsia, dysuria, hematuria, frequency, urgency, abnormal bleeding or bruising, fever, chills, unexpected weight changes, mood swings, change in skin or hair texture, change in voice quality, auditory or visual problems, allergic reactions or rashes, new musculoskeletal complaints other than usual "aches and pains".   Past Medical History:  Diagnosis Date  . Complication of anesthesia    "hard time waking up from it alot of times" (10/09/2013)  . Coronary artery disease    hx CABG 2011  . GERD (gastroesophageal reflux disease)    "not in >30 yr" (10/09/2013)  . Gout   . H/O cardiovascular stress test 2011     mild to mod ischemia this led to cath and CABG  . Hx of echocardiogram 2010   EF >55%, mild to mod MR, mild aortic reg.   Marland Kitchen Hyperlipidemia LDL goal <70   . Hypertension   . Kidney stone   . Myocardial infarction ([place]) 1999; 2006   both acute inferior wall  . Skin cancer    "right nose, lower lip, right ankle, left side" (10/09/2013)  . Ventricular fibrillation ([place]) 2006   arrest with MI    Past Surgical History:  Procedure Laterality Date  . BACK SURGERY    . CORONARY  ANGIOPLASTY WITH STENT PLACEMENT  1999   Stent to RCA  . CORONARY ANGIOPLASTY WITH STENT PLACEMENT  2006   stent to distal RCA  . CORONARY ANGIOPLASTY WITH STENT PLACEMENT  2006   mid LAD with Cypher stent  . CORONARY ARTERY BYPASS GRAFT  2011   LIMA-LAD; seq VG-1st & 2nd OM of LCX; VG-PDA of RCA  . CYSTOSCOPY     "had alot of laser for kidney stones" (10/09/2013)  .  CYSTOSCOPY W/ STONE MANIPULATION  12/2003  . I&D EXTREMITY  06/18/2012   Procedure: IRRIGATION AND DEBRIDEMENT EXTREMITY;  Surgeon: Linna Hoff, MD;  Location: [facility];  Service: Orthopedics;  Laterality: Left;  I & D left thumb   . LITHOTRIPSY     "several times" (10/09/2013)  . [place]   "disc ruptured" (10/09/2013)  . SKIN CANCER EXCISION     "right nose, lower lip; right ankle, left side" (10/09/2013)    Current Medications: Current Meds  Medication Sig  . amLODipine (NORVASC) 2.5 MG tablet Take 2.5 mg by mouth daily.  Marland Kitchen aspirin EC 81 MG tablet Take 81 mg by mouth daily.  Marland Kitchen atenolol (TENORMIN) 25 MG tablet Take 0.5 tablets (12.5 mg total) by mouth 2 (two) times daily.  Marland Kitchen EPINEPHrine (EPIPEN) 0.3 mg/0.3 mL SOAJ injection Inject 0.3 mg into the muscle once as needed (allergic reaction).  Marland Kitchen omeprazole (PRILOSEC) 20 MG capsule TAKE 1 CAPSULE BY MOUTH EVERY DAY  . ondansetron (ZOFRAN ODT) 8 MG disintegrating tablet Take 1 tablet (8 mg total) by mouth every 8 (eight) hours as needed for nausea or vomiting.  . pravastatin (PRAVACHOL) 10 MG tablet Take 10 mg by mouth at bedtime.      Allergies:   Yellow jacket venom [bee venom], Benicar [olmesartan], Cozaar [losartan], Plavix [clopidogrel], Relafen [nabumetone], Statins, and Tekturna [aliskiren]   Social History   Socioeconomic History  . Marital status: Married    Spouse name: Not on file  . Number of children: Not on file  . Years of education: Not on file  . Highest education level: Not on file  Occupational History  . Not on file  Social Needs  . Financial resource strain: Not on file  . Food insecurity    Worry: Not on file    Inability: Not on file  . Transportation needs    Medical: Not on file    Non-medical: Not on file  Tobacco Use  . Smoking status: Never Smoker  . Smokeless tobacco: Never Used  Substance and Sexual Activity  . Alcohol use: Yes    Comment: 10/09/2013 "I drink a beer occasionally;  not regular"  . Drug use: No  . Sexual activity: Yes  Lifestyle  . Physical activity    Days per week: Not on file    Minutes per session: Not on file  . Stress: Not on file  Relationships  . Social Herbalist on phone: Not on file    Gets together: Not on file    Attends religious service: Not on file    Active member of club or organization: Not on file    Attends meetings of clubs or organizations: Not on file    Relationship status: Not on file  Other Topics Concern  . Not on file  Social History Narrative  . Not on file     Family History: The patient's family history includes Cancer in his brother; Colon cancer in his sister; Diabetes in  his mother; Heart attack in his father.  ROS:   Please see the history of present illness.     All other systems reviewed and are negative.  EKGs/Labs/Other Studies Reviewed:    The following studies were reviewed today: Cardiac catheterization and operative report from bypass surgery 2011 Nuclear stress test 2014  EKG:  EKG is  ordered today.  The ekg ordered today demonstrates sinus rhythm with a couple of PVCs, ST segment depression and T wave inversions seen mostly in leads V4-V6 and in the inferior leads, similar to 2018.  Recent Labs: February 25, 2019 Hemoglobin 15.3, creatinine 1.15, potassium 4.3, normal LFTs, TSH 5.17, hemoglobin A1c 5.5% Recent Lipid Panel February 25, 2019 Total cholesterol 153, HDL 30, LDL 92, triglycerides 156  Physical Exam:    VS:  BP 139/76   Pulse 62   Temp (!) 97 F (36.1 C)   Ht 5\' 10"  (1.778 m)   Wt 180 lb (81.6 kg)   SpO2 96%   BMI 25.83 kg/m     Wt Readings from Last 3 Encounters:  08/05/19 180 lb (81.6 kg)  01/16/19 188 lb (85.3 kg)  01/12/19 188 lb (85.3 kg)     GEN:  Well nourished, well developed in no acute distress HEENT: Normal NECK: No JVD; No carotid bruits LYMPHATICS: No lymphadenopathy CARDIAC: RRR, no murmurs, rubs, gallops RESPIRATORY:  Clear to  auscultation without rales, wheezing or rhonchi  ABDOMEN: Soft, non-tender, non-distended MUSCULOSKELETAL:  No edema; No deformity  SKIN: Warm and dry NEUROLOGIC:  Alert and oriented x 3 PSYCHIATRIC:  Normal affect   ASSESSMENT:    1. Coronary artery disease involving native coronary artery of native heart without angina pectoris   2. Hyperlipidemia LDL goal <70   3. Hx of ventricular fibrillation    PLAN:    In order of problems listed above:  1. CAD: Currently asymptomatic, with rather atypical presentations in the past, associated with arrhythmia more than typical angina.  Now roughly 9 years following four-vessel bypass surgery.  On aspirin, statin, low-dose beta-blocker. 2. HLP: Target LDL less than 70, but he reports poor tolerance to more potent statins.  We will retrieve his most recent labs that he reports were performed just about a week ago.  He is very close to optimal weight and is physically active.  For the time being continue pravastatin. 3. VF: This occurred in the setting of acute antral myocardial infarction with heavy thrombus burden in the right coronary artery and percutaneous intervention in 2006.  He has not had significant problems with palpitations or syncope since that time.   Medication Adjustments/Labs and Tests Ordered: Current medicines are reviewed at length with the patient today.  Concerns regarding medicines are outlined above.  Orders Placed This Encounter  Procedures  . EKG 12-Lead   No orders of the defined types were placed in this encounter.   Patient Instructions  Medication Instructions:  Your physician recommends that you continue on your current medications as directed. Please refer to the Current Medication list given to you today.  If you need a refill on your cardiac medications before your next appointment, please call your pharmacy.   Lab work: None ordered If you have labs (blood work) drawn today and your tests are completely  normal, you will receive your results only by: [place] (if you have MyChart) OR A paper copy in the mail If you have any lab test that is abnormal or we need to change your treatment,  we will call you to review the results.  Testing/Procedures: None ordered  Follow-Up: At [facility], you and your health needs are our priority.  As part of our continuing mission to provide you with exceptional heart care, we have created designated Provider Care Teams.  These Care Teams include your primary Cardiologist (physician) and Advanced Practice Providers (APPs -  Physician Assistants and Nurse Practitioners) who all work together to provide you with the care you need, when you need it. You will need a follow up appointment in 12 months.  Please call our office 2 months in advance to schedule this appointment.  You may see Sanda Klein, MD or one of the following Advanced Practice Providers on your designated Care Team: Almyra Deforest, PA-C Fabian Sharp, PA-C          Signed, Sanda Klein, MD  08/06/2019 11:12 AM    [place]

## 2019-08-05 NOTE — Patient Instructions (Signed)

## 2019-08-06 ENCOUNTER — Encounter: Payer: Self-pay | Admitting: Cardiovascular Disease

## 2020-02-04 DIAGNOSIS — M67912 Unspecified disorder of synovium and tendon, left shoulder: Secondary | ICD-10-CM | POA: Diagnosis not present

## 2020-02-04 DIAGNOSIS — M25512 Pain in left shoulder: Secondary | ICD-10-CM | POA: Diagnosis not present

## 2020-02-29 DIAGNOSIS — E039 Hypothyroidism, unspecified: Secondary | ICD-10-CM | POA: Diagnosis not present

## 2020-02-29 DIAGNOSIS — I10 Essential (primary) hypertension: Secondary | ICD-10-CM | POA: Diagnosis not present

## 2020-02-29 DIAGNOSIS — E7800 Pure hypercholesterolemia, unspecified: Secondary | ICD-10-CM | POA: Diagnosis not present

## 2020-02-29 DIAGNOSIS — E785 Hyperlipidemia, unspecified: Secondary | ICD-10-CM | POA: Diagnosis not present

## 2020-02-29 DIAGNOSIS — R7309 Other abnormal glucose: Secondary | ICD-10-CM | POA: Diagnosis not present

## 2020-03-03 DIAGNOSIS — I10 Essential (primary) hypertension: Secondary | ICD-10-CM | POA: Diagnosis not present

## 2020-03-03 DIAGNOSIS — I2510 Atherosclerotic heart disease of native coronary artery without angina pectoris: Secondary | ICD-10-CM | POA: Diagnosis not present

## 2020-03-03 DIAGNOSIS — E785 Hyperlipidemia, unspecified: Secondary | ICD-10-CM | POA: Diagnosis not present

## 2020-03-03 DIAGNOSIS — Z0000 Encounter for general adult medical examination without abnormal findings: Secondary | ICD-10-CM | POA: Diagnosis not present

## 2020-03-25 DIAGNOSIS — I10 Essential (primary) hypertension: Secondary | ICD-10-CM | POA: Diagnosis not present

## 2020-05-04 DIAGNOSIS — G8929 Other chronic pain: Secondary | ICD-10-CM | POA: Diagnosis not present

## 2020-05-04 DIAGNOSIS — M19012 Primary osteoarthritis, left shoulder: Secondary | ICD-10-CM | POA: Diagnosis not present

## 2020-05-04 DIAGNOSIS — M25512 Pain in left shoulder: Secondary | ICD-10-CM | POA: Diagnosis not present

## 2020-05-26 DIAGNOSIS — M25561 Pain in right knee: Secondary | ICD-10-CM | POA: Diagnosis not present

## 2020-08-09 NOTE — Progress Notes (Signed)
Cardiology Office Note:    Date:  08/10/2020   ID:  Jose [name], DOB May 04, 1936, MRN [id number]  PCP:  Thressa Sheller, MD (Inactive)  Cardiologist:  Sanda Klein, MD  Electrophysiologist:  None   Referring MD: No ref. provider found   No chief complaint on file.   History of Present Illness:    Jose [name] is a 84 y.o. male with a hx of coronary artery disease with history of previous myocardial infarction in 1999 and in 2006, the latter complicated by ventricular fibrillation cardiac arrest, previous stents to the right coronary artery and LAD artery, ultimately bypass surgery 2011 ([place], LIMA to LAD, SVG to PDA, sequential SVG to OM1-OM 2).  He has hypercholesterolemia and has been intolerant of potent statins, but able to take low-dose pravastatin.  In 2014 he underwent a nuclear perfusion study that showed normal perfusion and EF 57%.  During telemetry monitoring he had short bursts of nonsustained VT, but on the subsequent outpatient 30-day event monitor no meaningful problems were identified..  His presentations with coronary events were rather atypical.  It sounds like the first time he had a syncopal event while playing golf and was referred for treadmill stress test which was abnormal.  This led to a stent in the right coronary artery.  In 2006 he presented with acute inferior myocardial infarction and ventricular fibrillation arrest and received another stent in the right coronary artery.  He denies ever experiencing chest pain, but looking back it sounds like he may have interscapular discomfort as his anginal equivalent.  He feels well and has no cardiovascular complaints.  He was able to carry the lumbar for his own deck building project without any difficulty. This did not cause dyspnea or angina (his previous angina was interscapular discomfort). He does all his own yard work without difficulty. 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 He tells me he plans to live  until he is 84 years old. The patient specifically denies any chest pain at rest exertion, dyspnea at rest or with exertion, orthopnea, paroxysmal nocturnal dyspnea, syncope, palpitations, focal neurological deficits, intermittent claudication, lower extremity edema, unexplained weight gain, cough, hemoptysis or wheezing.   Past Medical History:  Diagnosis Date  . Complication of anesthesia    "hard time waking up from it alot of times" (10/09/2013)  . Coronary artery disease    hx CABG 2011  . GERD (gastroesophageal reflux disease)    "not in >30 yr" (10/09/2013)  . Gout   . H/O cardiovascular stress test 2011     mild to mod ischemia this led to cath and CABG  . Hx of echocardiogram 2010   EF >55%, mild to mod MR, mild aortic reg.   Marland Kitchen Hyperlipidemia LDL goal <70   . Hypertension   . Kidney stone   . Myocardial infarction ([place]) 1999; 2006   both acute inferior wall  . Skin cancer    "right nose, lower lip, right ankle, left side" (10/09/2013)  . Ventricular fibrillation ([place]) 2006   arrest with MI    Past Surgical History:  Procedure Laterality Date  . BACK SURGERY    . CORONARY ANGIOPLASTY WITH STENT PLACEMENT  1999   Stent to RCA  . CORONARY ANGIOPLASTY WITH STENT PLACEMENT  2006   stent to distal RCA  . CORONARY ANGIOPLASTY WITH STENT PLACEMENT  2006   mid LAD with Cypher stent  . CORONARY ARTERY BYPASS GRAFT  2011   LIMA-LAD; seq VG-1st &  2nd OM of LCX; VG-PDA of RCA  . CYSTOSCOPY     "had alot of laser for kidney stones" (10/09/2013)  . CYSTOSCOPY W/ STONE MANIPULATION  12/2003  . I & D EXTREMITY  06/18/2012   Procedure: IRRIGATION AND DEBRIDEMENT EXTREMITY;  Surgeon: Linna Hoff, MD;  Location: [facility];  Service: Orthopedics;  Laterality: Left;  I & D left thumb   . LITHOTRIPSY     "several times" (10/09/2013)  . [place]   "disc ruptured" (10/09/2013)  . SKIN CANCER EXCISION     "right nose, lower lip; right ankle, left side" (10/09/2013)    Current  Medications: No outpatient medications have been marked as taking for the 08/10/20 encounter (Office Visit) with Danyal Whitenack, Dani Gobble, MD.     Allergies:   Yellow jacket venom [bee venom], Benicar [olmesartan], Cozaar [losartan], Plavix [clopidogrel], Relafen [nabumetone], Statins, and Tekturna [aliskiren]   Social History   Socioeconomic History  . Marital status: Married    Spouse name: Not on file  . Number of children: Not on file  . Years of education: Not on file  . Highest education level: Not on file  Occupational History  . Not on file  Tobacco Use  . Smoking status: Never Smoker  . Smokeless tobacco: Never Used  Vaping Use  . Vaping Use: Never used  Substance and Sexual Activity  . Alcohol use: Yes    Comment: 10/09/2013 "I drink a beer occasionally; not regular"  . Drug use: No  . Sexual activity: Yes  Other Topics Concern  . Not on file  Social History Narrative  . Not on file   Social Determinants of Health   Financial Resource Strain:   . Difficulty of Paying Living Expenses: Not on file  Food Insecurity:   . Worried About Charity fundraiser in the Last Year: Not on file  . Ran Out of Food in the Last Year: Not on file  Transportation Needs:   . Lack of Transportation (Medical): Not on file  . Lack of Transportation (Non-Medical): Not on file  Physical Activity:   . Days of Exercise per Week: Not on file  . Minutes of Exercise per Session: Not on file  Stress:   . Feeling of Stress : Not on file  Social Connections:   . Frequency of Communication with Friends and Family: Not on file  . Frequency of Social Gatherings with Friends and Family: Not on file  . Attends Religious Services: Not on file  . Active Member of Clubs or Organizations: Not on file  . Attends Archivist Meetings: Not on file  . Marital Status: Not on file     Family History: The patient's family history includes Cancer in his brother; Colon cancer in his sister; Diabetes in  his mother; Heart attack in his father.  ROS:   Please see the history of present illness.    All other systems are reviewed and are negative.   EKGs/Labs/Other Studies Reviewed:    The following studies were reviewed today: Cardiac catheterization and operative report from bypass surgery 2011 Nuclear stress test 2014  EKG:  EKG is  ordered today.  Shows sinus bradycardia with a nonspecific intraventricular conduction delay mostly resembling an atypical left bundle branch block, left axis deviation, QRS 124 ms, minor ST-T wave changes. Recent Labs: February 29, 2020 Creatinine 1.29, hemoglobin A1c 5.9%, normal liver function tests, TSH 4.42 BMET    Component Value Date/Time  NA 137 08/31/2017 1400   K 4.3 08/31/2017 1400   CL 105 08/31/2017 1400   CO2 20 (L) 08/31/2017 1400   GLUCOSE 186 (H) 08/31/2017 1400   BUN 18 08/31/2017 1400   CREATININE 1.03 08/31/2017 1400   CALCIUM 9.5 08/31/2017 1400   GFRNONAA >60 08/31/2017 1400   GFRAA >60 08/31/2017 1400    Recent Lipid Panel February 29, 2020 Total cholesterol 168, HDL 40, LDL 103 , triglycerides 140 Lipid Panel  No results found for: CHOL, TRIG, HDL, CHOLHDL, VLDL, LDLCALC, LDLDIRECT, LABVLDL  Physical Exam:    VS:  BP (!) 152/77   Pulse (!) 57   Ht 5\' 11"  (1.803 m)   Wt 180 lb (81.6 kg)   SpO2 98%   BMI 25.10 kg/m     Wt Readings from Last 3 Encounters:  08/10/20 180 lb (81.6 kg)  08/05/19 180 lb (81.6 kg)  01/16/19 188 lb (85.3 kg)    General: Alert, oriented x3, no distress, appears younger than stated age Head: no evidence of trauma, PERRL, EOMI, no exophtalmos or lid lag, no myxedema, no xanthelasma; normal ears, nose and oropharynx Neck: normal jugular venous pulsations and no hepatojugular reflux; brisk carotid pulses without delay and no carotid bruits Chest: clear to auscultation, no signs of consolidation by percussion or palpation, normal fremitus, symmetrical and full respiratory  excursions Cardiovascular: normal position and quality of the apical impulse, regular rhythm, normal first and paradoxically split second heart sounds, no murmurs, rubs or gallops Abdomen: no tenderness or distention, no masses by palpation, no abnormal pulsatility or arterial bruits, normal bowel sounds, no hepatosplenomegaly Extremities: no clubbing, cyanosis or edema; 2+ radial, ulnar and brachial pulses bilaterally; 2+ right femoral, posterior tibial and dorsalis pedis pulses; 2+ left femoral, posterior tibial and dorsalis pedis pulses; no subclavian or femoral bruits Neurological: grossly nonfocal Psych: Normal mood and affect   ASSESSMENT:    1. Coronary artery disease involving native coronary artery of native heart without angina pectoris   2. Hyperlipidemia LDL goal <70   3. Hx of ventricular fibrillation   4. LBBB (left bundle branch block)    PLAN:    In order of problems listed above:  1. CAD: Very physically active and asymptomatic, with rather atypical presentations in the past, associated with arrhythmia more than typical angina (this was probably intrascapular discomfort).  Now roughly 910 years following four-vessel bypass surgery.  On aspirin, statin. Had been on low-dose beta-blocker, but this has been stopped, presumably due to bradycardia. 2. HLP: Target LDL less than 70, but he reports poor tolerance to more potent statins. Only able to tolerate pravastatin in a very low dose. Discussed newer lipid-lowering drugs but he is not interested. He is going to "stick to the old guidelines" for an LDL of 100. I had a difficult time missing otherwise, with his good track record over the last 10 years. 3. VF: This occurred in the setting of acute myocardial infarction with heavy thrombus burden in the right coronary artery and percutaneous intervention in 2006.  He has not had significant problems with palpitations or syncope since that time. 4. LBBB: Over the years he has steadily  developed an intraventricular conduction abnormality and sinus bradycardia. Beta-blockers have been stopped. He does not have any symptoms of bradycardia and a pacemaker is not indicated at this time.   Medication Adjustments/Labs and Tests Ordered: Current medicines are reviewed at length with the patient today.  Concerns regarding medicines are outlined above.  No  orders of the defined types were placed in this encounter.  No orders of the defined types were placed in this encounter.   Patient Instructions  Medication Instructions:  No changes *If you need a refill on your cardiac medications before your next appointment, please call your pharmacy*   Lab Work: None ordered If you have labs (blood work) drawn today and your tests are completely normal, you will receive your results only by: Marland Kitchen MyChart Message (if you have MyChart) OR . A paper copy in the mail If you have any lab test that is abnormal or we need to change your treatment, we will call you to review the results.   Testing/Procedures: None ordered   Follow-Up: At [facility], you and your health needs are our priority.  As part of our continuing mission to provide you with exceptional heart care, we have created designated Provider Care Teams.  These Care Teams include your primary Cardiologist (physician) and Advanced Practice Providers (APPs -  Physician Assistants and Nurse Practitioners) who all work together to provide you with the care you need, when you need it.  We recommend signing up for the patient portal called "MyChart".  Sign up information is provided on this After Visit Summary.  MyChart is used to connect with patients for Virtual Visits (Telemedicine).  Patients are able to view lab/test results, encounter notes, upcoming appointments, etc.  Non-urgent messages can be sent to your provider as well.   To learn more about what you can do with MyChart, go to [email or web address].    Your next  appointment:   12 month(s)  The format for your next appointment:   In Person  Provider:   You may see Sanda Klein, MD or one of the following Advanced Practice Providers on your designated Care Team:    Almyra Deforest, PA-C  Fabian Sharp, [place] or   Roby Lofts, PA-C        Signed, Sanda Klein, MD  08/10/2020 9:12 AM    [place]

## 2020-08-10 ENCOUNTER — Ambulatory Visit: Payer: Medicare Other | Admitting: Cardiovascular Disease

## 2020-08-10 ENCOUNTER — Encounter: Payer: Self-pay | Admitting: Cardiovascular Disease

## 2020-08-10 ENCOUNTER — Other Ambulatory Visit: Payer: Self-pay

## 2020-08-10 VITALS — BP 152/77 | HR 57 | Ht 71.0 in | Wt 180.0 lb

## 2020-08-10 DIAGNOSIS — E785 Hyperlipidemia, unspecified: Secondary | ICD-10-CM

## 2020-08-10 DIAGNOSIS — I447 Left bundle-branch block, unspecified: Secondary | ICD-10-CM

## 2020-08-10 DIAGNOSIS — I2510 Atherosclerotic heart disease of native coronary artery without angina pectoris: Secondary | ICD-10-CM | POA: Diagnosis not present

## 2020-08-10 DIAGNOSIS — Z8679 Personal history of other diseases of the circulatory system: Secondary | ICD-10-CM | POA: Diagnosis not present

## 2020-08-10 NOTE — Patient Instructions (Signed)

## 2020-08-12 NOTE — Addendum Note (Signed)
Addended by: Wonda Horner on: 08/12/2020 04:12 PM   Modules accepted: Orders

## 2020-08-12 NOTE — Addendum Note (Signed)
Addended by: Wonda Horner on: 08/12/2020 04:09 PM   Modules accepted: Orders

## 2020-09-02 DIAGNOSIS — R7301 Impaired fasting glucose: Secondary | ICD-10-CM | POA: Diagnosis not present

## 2020-09-02 DIAGNOSIS — R7309 Other abnormal glucose: Secondary | ICD-10-CM | POA: Diagnosis not present

## 2020-09-02 DIAGNOSIS — E785 Hyperlipidemia, unspecified: Secondary | ICD-10-CM | POA: Diagnosis not present

## 2020-09-07 DIAGNOSIS — M67912 Unspecified disorder of synovium and tendon, left shoulder: Secondary | ICD-10-CM | POA: Diagnosis not present

## 2020-09-07 DIAGNOSIS — M24812 Other specific joint derangements of left shoulder, not elsewhere classified: Secondary | ICD-10-CM | POA: Diagnosis not present

## 2020-09-12 ENCOUNTER — Emergency Department
Admission: EM | Admit: 2020-09-12 | Discharge: 2020-09-12 | Disposition: A | Discharge status: Home / Self Care | Payer: Medicare Other | Source: Home / Self Care

## 2020-09-12 ENCOUNTER — Other Ambulatory Visit: Payer: Self-pay

## 2020-09-12 ENCOUNTER — Encounter: Payer: Self-pay | Admitting: Emergency Medicine

## 2020-09-12 DIAGNOSIS — R300 Dysuria: Secondary | ICD-10-CM

## 2020-09-12 DIAGNOSIS — N10 Acute tubulo-interstitial nephritis: Secondary | ICD-10-CM

## 2020-09-12 LAB — POCT URINALYSIS DIP (MANUAL ENTRY)
Bilirubin, UA: NEGATIVE
Glucose, UA: NEGATIVE mg/dL
Ketones, POC UA: NEGATIVE mg/dL
Nitrite, UA: POSITIVE — AB
Protein Ur, POC: 100 mg/dL — AB
Spec Grav, UA: 1.02
Urobilinogen, UA: 0.2 U/dL
pH, UA: 6

## 2020-09-12 MED ORDER — SULFAMETHOXAZOLE-TRIMETHOPRIM 800-160 MG PO TABS: 1.0000 | ORAL_TABLET | Freq: Two times a day (BID) | ORAL | 0 refills | Status: DC

## 2020-09-12 NOTE — ED Provider Notes (Signed)
[name]    CSN: [id number] Arrival date & time: 09/12/20  1042      History   Chief Complaint Chief Complaint  Patient presents with  . Dysuria    HPI Jose [name] is a 84 y.o. male.   HPI  Patient presents for evaluation of dysuria.  Patient has a history of recurrent renal stones and UTIs.  Patient has been followed by urology and reports no recent passage of kidney stones.  He is not having any flank pain or fever, nausea or vomiting.  Symptoms have been present for 3 days.  Patient endorses chronic frequent urination although recently has increased over the course the last 3 days which made him suspect that he possibly has a UTI. Past Medical History:  Diagnosis Date  . Complication of anesthesia    "hard time waking up from it alot of times" (10/09/2013)  . Coronary artery disease    hx CABG 2011  . GERD (gastroesophageal reflux disease)    "not in >30 yr" (10/09/2013)  . Gout   . H/O cardiovascular stress test 2011     mild to mod ischemia this led to cath and CABG  . Hx of echocardiogram 2010   EF >55%, mild to mod MR, mild aortic reg.   Marland Kitchen Hyperlipidemia LDL goal <70   . Hypertension   . Kidney stone   . Myocardial infarction ([place]) 1999; 2006   both acute inferior wall  . Skin cancer    "right nose, lower lip, right ankle, left side" (10/09/2013)  . Ventricular fibrillation ([place]) 2006   arrest with MI    Patient Active Problem List   Diagnosis Date Noted  . LBBB (left bundle branch block) 08/10/2020  . Chest pain 10/09/2013  . HTN (hypertension) 10/09/2013  . Coronary artery disease, hx of two inf. MIs one with v. fib arrest, stents to RCA and ultimately in 2011 CABG    . Hx of myocardial infarction   . Hyperlipidemia LDL goal <70   . Hx of ventricular fibrillation     Past Surgical History:  Procedure Laterality Date  . BACK SURGERY    . CORONARY ANGIOPLASTY WITH STENT PLACEMENT  1999   Stent to RCA  . CORONARY ANGIOPLASTY WITH  STENT PLACEMENT  2006   stent to distal RCA  . CORONARY ANGIOPLASTY WITH STENT PLACEMENT  2006   mid LAD with Cypher stent  . CORONARY ARTERY BYPASS GRAFT  2011   LIMA-LAD; seq VG-1st & 2nd OM of LCX; VG-PDA of RCA  . CYSTOSCOPY     "had alot of laser for kidney stones" (10/09/2013)  . CYSTOSCOPY W/ STONE MANIPULATION  12/2003  . I & D EXTREMITY  06/18/2012   Procedure: IRRIGATION AND DEBRIDEMENT EXTREMITY;  Surgeon: Linna Hoff, MD;  Location: [facility];  Service: Orthopedics;  Laterality: Left;  I & D left thumb   . LITHOTRIPSY     "several times" (10/09/2013)  . [place]   "disc ruptured" (10/09/2013)  . SKIN CANCER EXCISION     "right nose, lower lip; right ankle, left side" (10/09/2013)       Home Medications    Prior to Admission medications   Medication Sig Start Date End Date Taking? Authorizing Provider  amLODipine (NORVASC) 2.5 MG tablet Take 2.5 mg by mouth daily.    [provider]  aspirin EC 81 MG tablet Take 81 mg by mouth daily.    [provider]  EPINEPHrine (EPIPEN) 0.3 mg/0.3 mL SOAJ injection Inject 0.3 mg into the muscle once as needed (allergic reaction).    [provider]  omeprazole (PRILOSEC) 20 MG capsule TAKE 1 CAPSULE BY MOUTH EVERY DAY 04/15/19   Mauri Pole, MD  pravastatin (PRAVACHOL) 10 MG tablet Take 10 mg by mouth at bedtime.  08/06/17   [provider]    Family History Family History  Problem Relation Age of Onset  . Diabetes Mother   . Heart attack Father   . Colon cancer Sister   . Cancer Brother     Social History Social History   Tobacco Use  . Smoking status: Never Smoker  . Smokeless tobacco: Never Used  Vaping Use  . Vaping Use: Never used  Substance Use Topics  . Alcohol use: Yes    Comment: 10/09/2013 "I drink a beer occasionally; not regular"  . Drug use: No     Allergies   Yellow jacket venom [bee venom], Benicar [olmesartan], Cozaar [losartan], Plavix  [clopidogrel], Relafen [nabumetone], Statins, and Tekturna [aliskiren]   Review of Systems Review of Systems Pertinent negatives listed in HPI Physical Exam Triage Vital Signs ED Triage Vitals  Enc Vitals Group     BP 09/12/20 1109 (!) 145/85     Pulse Rate 09/12/20 1109 (!) 59     Resp --      Temp 09/12/20 1109 98.2 F (36.8 C)     Temp Source 09/12/20 1109 Oral     SpO2 09/12/20 1109 97 %     Weight 09/12/20 1111 178 lb (80.7 kg)     Height 09/12/20 1111 5\' 11"  (1.803 m)     Head Circumference --      Peak Flow --      Pain Score 09/12/20 1110 6     Pain Loc --      Pain Edu? --      Excl. in [place]? --    No data found.  Updated Vital Signs BP (!) 145/85 (BP Location: Right Arm)   Pulse (!) 59   Temp 98.2 F (36.8 C) (Oral)   Ht 5\' 11"  (1.803 m)   Wt 178 lb (80.7 kg)   SpO2 97%   BMI 24.83 kg/m   Visual Acuity Right Eye Distance:   Left Eye Distance:   Bilateral Distance:    Right Eye Near:   Left Eye Near:    Bilateral Near:     Physical Exam General appearance: alert, well developed, well nourished, cooperative and in no distress Head: Normocephalic, without obvious abnormality, atraumatic Respiratory: Respirations even and unlabored, normal respiratory rate Heart: rate and rhythm normal. No gallop or murmurs noted on exam  Abdomen: BS +, no distention, no rebound tenderness, or no mass Extremities: No gross deformities Skin: Skin color, texture, turgor normal. No rashes seen  Psych: Appropriate mood and affect.  UC Treatments / Results  Labs (all labs ordered are listed, but only abnormal results are displayed) Labs Reviewed  POCT URINALYSIS DIP (MANUAL ENTRY) - Abnormal; Notable for the following components:      Result Value   Clarity, UA cloudy (*)    Blood, UA small (*)    Protein Ur, POC =100 (*)    Nitrite, UA Positive (*)    Leukocytes, UA Large (3+) (*)    All other components within normal limits  URINE CULTURE     EKG   Radiology No results found.  Procedures Procedures (including critical care time)  Medications Ordered in UC Medications - No data to display  Initial Impression / Assessment and Plan / UC Course  I have reviewed the triage vital signs and the nursing notes.  Pertinent labs & imaging results that were available during my care of the patient were reviewed by me and considered in my medical decision making (see chart for details).    Suspected acute pyelonephritis, given positive nitrites in urine.  Treating with Bactrim.  Red flags discussed indicating immediate need to follow-up at the ER discussed.  Urine culture pending.  Patient verbalized understanding agreement plan. Final Clinical Impressions(s) / UC Diagnoses   Final diagnoses:  Dysuria  Acute pyelonephritis     Discharge Instructions     If your symptom worsen or do improve, follow-up.    ED Prescriptions    Medication Sig Dispense Auth. Provider   sulfamethoxazole-trimethoprim (BACTRIM DS) 800-160 MG tablet Take 1 tablet by mouth 2 (two) times daily. 20 tablet Scot Jun, FNP     PDMP not reviewed this encounter.   Scot Jun, [place] 09/13/20 [phone]

## 2020-09-12 NOTE — Discharge Instructions (Signed)
If your symptom worsen or do improve, follow-up.

## 2020-09-12 NOTE — ED Triage Notes (Signed)
Dysuria a 1 week

## 2020-09-14 LAB — URINE CULTURE
MICRO NUMBER:: 11055581
SPECIMEN QUALITY:: ADEQUATE

## 2021-03-03 DIAGNOSIS — R7309 Other abnormal glucose: Secondary | ICD-10-CM | POA: Diagnosis not present

## 2021-03-03 DIAGNOSIS — E7800 Pure hypercholesterolemia, unspecified: Secondary | ICD-10-CM | POA: Diagnosis not present

## 2021-03-03 DIAGNOSIS — E039 Hypothyroidism, unspecified: Secondary | ICD-10-CM | POA: Diagnosis not present

## 2021-03-03 DIAGNOSIS — E785 Hyperlipidemia, unspecified: Secondary | ICD-10-CM | POA: Diagnosis not present

## 2021-03-08 DIAGNOSIS — I2510 Atherosclerotic heart disease of native coronary artery without angina pectoris: Secondary | ICD-10-CM | POA: Diagnosis not present

## 2021-03-08 DIAGNOSIS — Z0000 Encounter for general adult medical examination without abnormal findings: Secondary | ICD-10-CM | POA: Diagnosis not present

## 2021-03-08 DIAGNOSIS — E785 Hyperlipidemia, unspecified: Secondary | ICD-10-CM | POA: Diagnosis not present

## 2021-03-08 DIAGNOSIS — R7309 Other abnormal glucose: Secondary | ICD-10-CM | POA: Diagnosis not present

## 2021-03-08 DIAGNOSIS — I10 Essential (primary) hypertension: Secondary | ICD-10-CM | POA: Diagnosis not present

## 2021-03-08 DIAGNOSIS — E038 Other specified hypothyroidism: Secondary | ICD-10-CM | POA: Diagnosis not present

## 2021-03-14 DIAGNOSIS — H2513 Age-related nuclear cataract, bilateral: Secondary | ICD-10-CM | POA: Diagnosis not present

## 2021-05-10 DIAGNOSIS — M19072 Primary osteoarthritis, left ankle and foot: Secondary | ICD-10-CM | POA: Diagnosis not present

## 2021-05-10 DIAGNOSIS — M79672 Pain in left foot: Secondary | ICD-10-CM | POA: Diagnosis not present

## 2021-06-07 DIAGNOSIS — R5383 Other fatigue: Secondary | ICD-10-CM | POA: Diagnosis not present

## 2021-06-07 DIAGNOSIS — Z8739 Personal history of other diseases of the musculoskeletal system and connective tissue: Secondary | ICD-10-CM | POA: Diagnosis not present

## 2021-06-07 DIAGNOSIS — R7309 Other abnormal glucose: Secondary | ICD-10-CM | POA: Diagnosis not present

## 2021-06-07 DIAGNOSIS — Z87440 Personal history of urinary (tract) infections: Secondary | ICD-10-CM | POA: Diagnosis not present

## 2021-06-12 DIAGNOSIS — E039 Hypothyroidism, unspecified: Secondary | ICD-10-CM | POA: Diagnosis not present

## 2021-06-28 DIAGNOSIS — Z951 Presence of aortocoronary bypass graft: Secondary | ICD-10-CM | POA: Diagnosis not present

## 2021-06-28 DIAGNOSIS — R0781 Pleurodynia: Secondary | ICD-10-CM | POA: Diagnosis not present

## 2021-06-28 DIAGNOSIS — R0602 Shortness of breath: Secondary | ICD-10-CM | POA: Diagnosis not present

## 2021-08-14 DIAGNOSIS — E039 Hypothyroidism, unspecified: Secondary | ICD-10-CM | POA: Diagnosis not present

## 2021-08-17 DIAGNOSIS — E039 Hypothyroidism, unspecified: Secondary | ICD-10-CM | POA: Diagnosis not present

## 2021-09-06 DIAGNOSIS — E785 Hyperlipidemia, unspecified: Secondary | ICD-10-CM | POA: Diagnosis not present

## 2021-09-06 DIAGNOSIS — E039 Hypothyroidism, unspecified: Secondary | ICD-10-CM | POA: Diagnosis not present

## 2021-09-06 DIAGNOSIS — R7309 Other abnormal glucose: Secondary | ICD-10-CM | POA: Diagnosis not present

## 2021-09-13 ENCOUNTER — Encounter (HOSPITAL_COMMUNITY): Payer: Self-pay | Admitting: Emergency Medicine

## 2021-09-13 ENCOUNTER — Emergency Department (HOSPITAL_COMMUNITY): Payer: Medicare Other

## 2021-09-13 ENCOUNTER — Other Ambulatory Visit: Payer: Self-pay

## 2021-09-13 ENCOUNTER — Emergency Department (HOSPITAL_COMMUNITY)
Admission: EM | Admit: 2021-09-13 | Discharge: 2021-09-13 | Disposition: A | Discharge status: Home / Self Care | Payer: Medicare Other | Attending: Emergency Medicine | Admitting: Emergency Medicine

## 2021-09-13 DIAGNOSIS — R42 Dizziness and giddiness: Secondary | ICD-10-CM | POA: Diagnosis not present

## 2021-09-13 DIAGNOSIS — I10 Essential (primary) hypertension: Secondary | ICD-10-CM | POA: Diagnosis not present

## 2021-09-13 DIAGNOSIS — Z955 Presence of coronary angioplasty implant and graft: Secondary | ICD-10-CM | POA: Diagnosis not present

## 2021-09-13 DIAGNOSIS — I2510 Atherosclerotic heart disease of native coronary artery without angina pectoris: Secondary | ICD-10-CM | POA: Insufficient documentation

## 2021-09-13 DIAGNOSIS — R350 Frequency of micturition: Secondary | ICD-10-CM | POA: Diagnosis not present

## 2021-09-13 DIAGNOSIS — Z85828 Personal history of other malignant neoplasm of skin: Secondary | ICD-10-CM | POA: Insufficient documentation

## 2021-09-13 DIAGNOSIS — Z951 Presence of aortocoronary bypass graft: Secondary | ICD-10-CM | POA: Insufficient documentation

## 2021-09-13 DIAGNOSIS — R030 Elevated blood-pressure reading, without diagnosis of hypertension: Secondary | ICD-10-CM | POA: Diagnosis present

## 2021-09-13 DIAGNOSIS — E039 Hypothyroidism, unspecified: Secondary | ICD-10-CM | POA: Diagnosis not present

## 2021-09-13 LAB — CBC WITH DIFFERENTIAL/PLATELET
Abs Immature Granulocytes: 0.04 10*3/uL (ref 0.00–0.07)
Basophils Absolute: 0.1 10*3/uL (ref 0.0–0.1)
Basophils Relative: 1 %
Eosinophils Absolute: 0.1 10*3/uL (ref 0.0–0.5)
Eosinophils Relative: 2 %
HCT: 47.3 % (ref 39.0–52.0)
Hemoglobin: 15.4 g/dL (ref 13.0–17.0)
Immature Granulocytes: 1 %
Lymphocytes Relative: 17 %
Lymphs Abs: 1.4 10*3/uL (ref 0.7–4.0)
MCH: 31.2 pg (ref 26.0–34.0)
MCHC: 32.6 g/dL (ref 30.0–36.0)
MCV: 95.7 fL (ref 80.0–100.0)
Monocytes Absolute: 0.7 10*3/uL (ref 0.1–1.0)
Monocytes Relative: 9 %
Neutro Abs: 5.6 10*3/uL (ref 1.7–7.7)
Neutrophils Relative %: 70 %
Platelets: 171 10*3/uL (ref 150–400)
RBC: 4.94 MIL/uL (ref 4.22–5.81)
RDW: 12.9 % (ref 11.5–15.5)
WBC: 8.1 10*3/uL (ref 4.0–10.5)
nRBC: 0 % (ref 0.0–0.2)

## 2021-09-13 LAB — COMPREHENSIVE METABOLIC PANEL
ALT: 18 U/L (ref 0–44)
AST: 22 U/L (ref 15–41)
Albumin: 3.6 g/dL (ref 3.5–5.0)
Alkaline Phosphatase: 71 U/L (ref 38–126)
Anion gap: 8 (ref 5–15)
BUN: 15 mg/dL (ref 8–23)
CO2: 27 mmol/L (ref 22–32)
Calcium: 9.3 mg/dL (ref 8.9–10.3)
Chloride: 106 mmol/L (ref 98–111)
Creatinine, Ser: 1.1 mg/dL (ref 0.61–1.24)
GFR, Estimated: >60 mL/min (ref >60)
Glucose, Bld: 103 mg/dL — ABNORMAL HIGH (ref 70–99)
Potassium: 4.1 mmol/L (ref 3.5–5.1)
Sodium: 141 mmol/L (ref 135–145)
Total Bilirubin: 0.9 mg/dL (ref 0.3–1.2)
Total Protein: 6.8 g/dL (ref 6.5–8.1)

## 2021-09-13 LAB — URINALYSIS, ROUTINE W REFLEX MICROSCOPIC
Bilirubin Urine: NEGATIVE
Glucose, UA: NEGATIVE mg/dL
Hgb urine dipstick: NEGATIVE
Ketones, ur: NEGATIVE mg/dL
Leukocytes,Ua: NEGATIVE
Nitrite: NEGATIVE
Protein, ur: NEGATIVE mg/dL
Specific Gravity, Urine: 1.013 (ref 1.005–1.030)
pH: 6 (ref 5.0–8.0)

## 2021-09-13 MED ORDER — HYDRALAZINE HCL 25 MG PO TABS
25.0000 mg | ORAL_TABLET | Freq: Once | ORAL | Status: AC
  Administered 2021-09-13: 25 mg via ORAL
  Filled 2021-09-13: qty 1

## 2021-09-13 NOTE — ED Triage Notes (Signed)
Pt sent from PCP due to HTN. Endorses lightheadedness and frequent falls.

## 2021-09-13 NOTE — ED Provider Notes (Signed)
Emergency Department Provider Note   I have reviewed the triage vital signs and the nursing notes.   HISTORY  Chief Complaint Hypertension   HPI Jose [name] is a 85 y.o. male with past medical history reviewed below presents to the emergency department with elevated blood pressure from his primary care doctor's office.  Patient states he has chronic vertigo and occasional gait instability.  He states this has been ongoing for the past 6 months at least, possibly longer.  He tells me his last episode of lightheadedness was yesterday while shopping at the hardware store.  He states it lasted for only seconds and he did not have chest pain/pressure, palpitations, shortness of breath.  No unilateral weakness or numbness.  He has since been walking normally and has not noticed any vision or speech deficits.  He saw his primary care doctor in the office today and was found to have very high blood pressures and sent to the emergency department for evaluation. No radiation of symptoms or modifying factors.    Past Medical History:  Diagnosis Date   Complication of anesthesia    "hard time waking up from it alot of times" (10/09/2013)   Coronary artery disease    hx CABG 2011   GERD (gastroesophageal reflux disease)    "not in >30 yr" (10/09/2013)   Gout    H/O cardiovascular stress test 2011     mild to mod ischemia this led to cath and CABG   Hx of echocardiogram 2010   EF >55%, mild to mod MR, mild aortic reg.    Hyperlipidemia LDL goal <70    Hypertension    Kidney stone    Myocardial infarction [facility]) 1999; 2006   both acute inferior wall   Skin cancer    "right nose, lower lip, right ankle, left side" (10/09/2013)   Ventricular fibrillation ([place]) 2006   arrest with MI    Patient Active Problem List   Diagnosis Date Noted   LBBB (left bundle branch block) 08/10/2020   Chest pain 10/09/2013   HTN (hypertension) 10/09/2013   Coronary artery disease, hx of two inf. MIs one  with v. fib arrest, stents to RCA and ultimately in 2011 CABG     Hx of myocardial infarction    Hyperlipidemia LDL goal <70    Hx of ventricular fibrillation     Past Surgical History:  Procedure Laterality Date   [place]   Stent to RCA   CORONARY ANGIOPLASTY WITH STENT PLACEMENT  2006   stent to distal RCA   CORONARY ANGIOPLASTY WITH STENT PLACEMENT  2006   mid LAD with Cypher stent   CORONARY ARTERY BYPASS GRAFT  2011   LIMA-LAD; seq VG-1st & 2nd OM of LCX; VG-PDA of RCA   CYSTOSCOPY     "had alot of laser for kidney stones" (10/09/2013)   CYSTOSCOPY W/ STONE MANIPULATION  12/2003   I & D EXTREMITY  06/18/2012   Procedure: IRRIGATION AND DEBRIDEMENT EXTREMITY;  Surgeon: Linna Hoff, MD;  Location: [place];  Service: Orthopedics;  Laterality: Left;  I & D left thumb    LITHOTRIPSY     "several times" (10/09/2013)   [place]   "disc ruptured" (10/09/2013)   SKIN CANCER EXCISION     "right nose, lower lip; right ankle, left side" (10/09/2013)    Allergies Yellow jacket venom [bee venom], Benicar [olmesartan], Cozaar [  losartan], Plavix [clopidogrel], Relafen [nabumetone], Statins, and Tekturna [aliskiren]  Family History  Problem Relation Age of Onset   Diabetes Mother    Heart attack Father    Colon cancer Sister    Cancer Brother     Social History Social History   Tobacco Use   Smoking status: Never   Smokeless tobacco: Never  Vaping Use   Vaping Use: Never used  Substance Use Topics   Alcohol use: Yes    Comment: 10/09/2013 "I drink a beer occasionally; not regular"   Drug use: No    Review of Systems  Constitutional: No fever/chills Eyes: No visual changes. ENT: No sore throat.  Cardiovascular: Denies chest pain. Respiratory: Denies shortness of breath. Gastrointestinal: No abdominal pain.  No nausea, no vomiting.  No diarrhea.  No constipation. Genitourinary: Negative for  dysuria. Musculoskeletal: Negative for back pain. Skin: Negative for rash. Neurological: Negative for headaches, focal weakness or numbness.  10-point ROS otherwise negative.  ____________________________________________   PHYSICAL EXAM:  VITAL SIGNS: ED Triage Vitals  Enc Vitals Group     BP 09/13/21 1254 (!) 197/94     Pulse Rate 09/13/21 1254 62     Resp 09/13/21 1254 18     Temp 09/13/21 1254 98 F (36.7 C)     Temp Source 09/13/21 1254 Oral     SpO2 09/13/21 1254 97 %   Constitutional: Alert and oriented. Well appearing and in no acute distress. Eyes: Conjunctivae are normal.  Head: Atraumatic. Nose: No congestion/rhinnorhea. Mouth/Throat: Mucous membranes are moist.  Neck: No stridor.   Cardiovascular: Normal rate, regular rhythm. Good peripheral circulation. Grossly normal heart sounds.   Respiratory: Normal respiratory effort.  No retractions. Lungs CTAB. Gastrointestinal: Soft and nontender. No distention.  Musculoskeletal: No lower extremity tenderness nor edema. No gross deformities of extremities. Neurologic:  Normal speech and language. No gross focal neurologic deficits are appreciated.  Skin:  Skin is warm, dry and intact. No rash noted.   ____________________________________________   LABS (all labs ordered are listed, but only abnormal results are displayed)  Labs Reviewed  COMPREHENSIVE METABOLIC PANEL - Abnormal; Notable for the following components:      Result Value   Glucose, Bld 103 (*)    All other components within normal limits  CBC WITH DIFFERENTIAL/PLATELET  URINALYSIS, ROUTINE W REFLEX MICROSCOPIC   ____________________________________________  EKG   EKG Interpretation  Date/Time:  Wednesday September 13 2021 13:01:11 EDT Ventricular Rate:  66 PR Interval:  182 QRS Duration: 112 QT Interval:  434 QTC Calculation: 166 R Axis:   -4 Text Interpretation: Normal sinus rhythm Minimal voltage criteria for LVH, may be normal variant (  Cornell product ) Nonspecific T wave abnormality Abnormal ECG Confirmed by Gerlene Fee [phone]) on 09/14/2021 11:14:08 AM        ____________________________________________  RADIOLOGY  CT Head Wo Contrast  Result Date: 09/13/2021 CLINICAL DATA:  Dizziness, frequent falls. EXAM: CT HEAD WITHOUT CONTRAST TECHNIQUE: Contiguous axial images were obtained from the base of the skull through the vertex without intravenous contrast. COMPARISON:  08/18/2004 FINDINGS: Brain: Progressive generalized atrophy. Negative for hydrocephalus. Extensive white matter hypodensity bilaterally with progression. 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 Small chronic infarcts in the cerebellum bilaterally. Negative for acute infarct, hemorrhage, mass Vascular: Negative for hyperdense vessel Skull: Negative Sinuses/Orbits: Negative Other: None IMPRESSION: No acute intracranial abnormality. Atrophy and extensive chronic microvascular ischemic change with progression since 2005. Electronically Signed   By: Franchot Gallo M.D.  On: 09/13/2021 14:20    ____________________________________________   PROCEDURES  Procedure(s) performed:   Procedures  None  ____________________________________________   INITIAL IMPRESSION / ASSESSMENT AND PLAN / ED COURSE  Pertinent labs & imaging results that were available during my care of the patient were reviewed by me and considered in my medical decision making (see chart for details).   Patient presents emergency department with elevated blood pressure.  He is chronic vertigo and gait instability.  No focal neurologic deficits on exam to strongly suspect central process.  CT imaging of the head obtained during triage.  Do not see an indication for emergent MRI here.  Exam and blood work not consistent with acute hypertensive emergency.  Gave hydralazine p.o. here.  Patient has a follow-up appoint with his PCP for further blood pressure management and evaluation.   Discussed strict ED return precautions.   ____________________________________________  FINAL CLINICAL IMPRESSION(S) / ED DIAGNOSES  Final diagnoses:  Primary hypertension     MEDICATIONS GIVEN DURING THIS VISIT:  Medications  hydrALAZINE (APRESOLINE) tablet 25 mg (25 mg Oral Given 09/13/21 1801)      Note:  This document was prepared using Dragon voice recognition software and may include unintentional dictation errors.  Nanda Quinton, MD, [facility] Emergency Medicine    Niyla Marone, [facility], MD 09/20/21 [phone]

## 2021-09-13 NOTE — Discharge Instructions (Signed)
As we discussed, though you do have high blood pressure (hypertension), fortunately it is not immediately dangerous at this time and does not need emergency intervention or admission to the hospital.  If we add to or change your regular medications, we may cause more harm than good - it is more appropriate for your primary care doctor to evaluate you in clinic and decide if any medication changes are needed.  Please follow up in clinic as recommended in these papers.    Your urine does not show a urine infection.

## 2021-09-13 NOTE — ED Provider Notes (Signed)
Emergency Medicine Provider Triage Evaluation Note  Jose [name] , a 85 y.o. male  was evaluated in triage.  Pt complains of hypertension.  States his primary care doctor sent him to the ED for concern about his blood pressure being too high.  Patient also endorses vertigo symptoms, worse when he gets them sitting to standing.  Has transient blurry vision changes.    Review of Systems  Positive: High blood pressure, blurry vision, vertigo Negative: Denies any chest pain, unilateral weakness, shortness of breath,, headache..  Physical Exam  BP (!) 197/94 (BP Location: Right Arm)   Pulse 62   Temp 98 F (36.7 C) (Oral)   Resp 18   SpO2 97%  Gen:   Awake, no distress   Resp:  Normal effort  MSK:   Moves extremities without difficulty  Other:  Cranial nerves IIi through XII are grossly intact.  No nystagmus.  Grip strength equal bilaterally.  No dysarthria.  Medical Decision Making  Medically screening exam initiated at 1:02 PM.  Appropriate orders placed.  Jose [name] was informed that the remainder of the evaluation will be completed by another provider, this initial triage assessment does not replace that evaluation, and the importance of remaining in the ED until their evaluation is complete.  Labs, urine, CT.   Sherrill Raring, PA-C 09/13/21 1303    Isla Pence, MD 09/13/21 [phone]

## 2021-09-21 DIAGNOSIS — R296 Repeated falls: Secondary | ICD-10-CM | POA: Diagnosis not present

## 2021-09-21 DIAGNOSIS — I10 Essential (primary) hypertension: Secondary | ICD-10-CM | POA: Diagnosis not present

## 2021-10-03 DIAGNOSIS — I10 Essential (primary) hypertension: Secondary | ICD-10-CM | POA: Diagnosis not present

## 2021-10-06 ENCOUNTER — Encounter: Payer: Self-pay | Admitting: Cardiovascular Disease

## 2021-10-06 ENCOUNTER — Other Ambulatory Visit: Payer: Self-pay

## 2021-10-06 ENCOUNTER — Ambulatory Visit: Payer: Medicare Other | Admitting: Cardiovascular Disease

## 2021-10-06 VITALS — BP 130/76 | HR 79 | Ht 71.0 in | Wt 190.0 lb

## 2021-10-06 DIAGNOSIS — I2510 Atherosclerotic heart disease of native coronary artery without angina pectoris: Secondary | ICD-10-CM

## 2021-10-06 DIAGNOSIS — I447 Left bundle-branch block, unspecified: Secondary | ICD-10-CM

## 2021-10-06 DIAGNOSIS — R42 Dizziness and giddiness: Secondary | ICD-10-CM | POA: Diagnosis not present

## 2021-10-06 DIAGNOSIS — E7800 Pure hypercholesterolemia, unspecified: Secondary | ICD-10-CM

## 2021-10-06 DIAGNOSIS — Z8679 Personal history of other diseases of the circulatory system: Secondary | ICD-10-CM

## 2021-10-06 NOTE — Patient Instructions (Signed)

## 2021-10-06 NOTE — Progress Notes (Signed)
Cardiology Office Note:    Date:  10/08/2021   ID:  Jose [name], DOB 08-06-1936, MRN [id number]  PCP:  Holland Commons, FNP  Cardiologist:  Sanda Klein, MD  Electrophysiologist:  None   Referring MD: No ref. provider found   No chief complaint on file.   History of Present Illness:    Jose [name] is a 85 y.o. male with a hx of coronary artery disease with history of previous myocardial infarction in 1999 and in 2006, the latter complicated by ventricular fibrillation cardiac arrest, previous stents to the right coronary artery and LAD artery, ultimately bypass surgery 2011 ([place], LIMA to LAD, SVG to PDA, sequential SVG to OM1-OM 2).  He has hypercholesterolemia and has been intolerant of potent statins, but able to take low-dose pravastatin.  In 2014 he underwent a nuclear perfusion study that showed normal perfusion and EF 57%.  During telemetry monitoring he had short bursts of nonsustained VT, but on the subsequent outpatient 30-day event monitor no meaningful problems were identified.  He feels well and does not have any cardiovascular complaints, but he has had some falls.  These have generally happen when he bends over.  He becomes very dizzy with changes in position.  He has stopped playing golf because he began to be very dizzy whenever he bent over to pick up his ball.  He is confident that the symptoms occurred because he was prescribed prednisone for a gout attack, more than 6 months ago.  Symptoms clearly worsened at that time according to him..  He has not had any episodes of dizziness or syncope that have occurred randomly, without associated changes in position.  He was seen in the emergency room on October 12, being sent there for elevated blood pressure from his PCPs office.  His complaints were of vertigo and gait instability.  His blood pressure was 197/94.  His ECG showed normal sinus rhythm, voltage criteria for LVH.  CT of the head showed generalized atrophy  and extensive chronic microvascular ischemic change, progressed since 2005.  There was a chronic infarct in the right frontal lobe and small chronic infarcts in the cerebellum bilaterally.  Labs were unremarkable.  He was given a single dose of oral hydralazine and discharged.  He denies angina (his previous angina was intrascapular pain, has not had any recently) or dyspnea at rest or with activity, denies orthopnea, PND, lower extremity edema, claudication.  Has not been aware of any palpitations, although ectopic beats are clearly present on physical exam.  Denies any neurological complaints.  When he first checked in his blood pressure was 178/82 with the automatic blood pressure monitor.  When I checked his blood pressure manually, it was only 130/76.  He had frequent ectopic beats with possible wide variation in the blood pressure reading if checked automatically.  His presentations with coronary events were rather atypical.  It sounds like the first time he had a syncopal event while playing golf and was referred for treadmill stress test which was abnormal.  This led to a stent in the right coronary artery.  In 2006 he presented with acute inferior myocardial infarction and ventricular fibrillation arrest and received another stent in the right coronary artery.  He denies ever experiencing chest pain, but looking back it sounds like he may have interscapular discomfort as his anginal equivalent.   Past Medical History:  Diagnosis Date   Complication of anesthesia    "hard time waking up from it alot of  times" (10/09/2013)   Coronary artery disease    hx CABG 2011   GERD (gastroesophageal reflux disease)    "not in >30 yr" (10/09/2013)   Gout    H/O cardiovascular stress test 2011     mild to mod ischemia this led to cath and CABG   Hx of echocardiogram 2010   EF >55%, mild to mod MR, mild aortic reg.    Hyperlipidemia LDL goal <70    Hypertension    Kidney stone    Myocardial  infarction [facility]) 1999; 2006   both acute inferior wall   Skin cancer    "right nose, lower lip, right ankle, left side" (10/09/2013)   Ventricular fibrillation ([place]) 2006   arrest with MI    Past Surgical History:  Procedure Laterality Date   [place]   Stent to RCA   CORONARY ANGIOPLASTY WITH STENT PLACEMENT  2006   stent to distal RCA   CORONARY ANGIOPLASTY WITH STENT PLACEMENT  2006   mid LAD with Cypher stent   CORONARY ARTERY BYPASS GRAFT  2011   LIMA-LAD; seq VG-1st & 2nd OM of LCX; VG-PDA of RCA   CYSTOSCOPY     "had alot of laser for kidney stones" (10/09/2013)   CYSTOSCOPY W/ STONE MANIPULATION  12/2003   I & D EXTREMITY  06/18/2012   Procedure: IRRIGATION AND DEBRIDEMENT EXTREMITY;  Surgeon: Linna Hoff, MD;  Location: [place];  Service: Orthopedics;  Laterality: Left;  I & D left thumb    LITHOTRIPSY     "several times" (10/09/2013)   [place]   "disc ruptured" (10/09/2013)   SKIN CANCER EXCISION     "right nose, lower lip; right ankle, left side" (10/09/2013)    Current Medications: Current Meds  Medication Sig   aspirin EC 81 MG tablet Take 81 mg by mouth daily.   EPINEPHrine 0.3 mg/0.3 mL IJ SOAJ injection Inject 0.3 mg into the muscle once as needed (allergic reaction).   omeprazole (PRILOSEC) 20 MG capsule TAKE 1 CAPSULE BY MOUTH EVERY DAY (Patient taking differently: Take 20 mg by mouth daily.)   pravastatin (PRAVACHOL) 10 MG tablet Take 10 mg by mouth at bedtime.      Allergies:   Yellow jacket venom [bee venom], Benicar [olmesartan], Cozaar [losartan], Plavix [clopidogrel], Relafen [nabumetone], Statins, and Tekturna [aliskiren]   Social History   Socioeconomic History   Marital status: Married    Spouse name: Not on file   Number of children: Not on file   Years of education: Not on file   Highest education level: Not on file  Occupational History   Not on file  Tobacco Use    Smoking status: Never   Smokeless tobacco: Never  Vaping Use   Vaping Use: Never used  Substance and Sexual Activity   Alcohol use: Yes    Comment: 10/09/2013 "I drink a beer occasionally; not regular"   Drug use: No   Sexual activity: Yes  Other Topics Concern   Not on file  Social History Narrative   Not on file   Social Determinants of Health   Financial Resource Strain: Not on file  Food Insecurity: Not on file  Transportation Needs: Not on file  Physical Activity: Not on file  Stress: Not on file  Social Connections: Not on file     Family History: The patient's family history includes Cancer in his brother; Colon  cancer in his sister; Diabetes in his mother; Heart attack in his father.  ROS:   Please see the history of present illness.    All other systems are reviewed and are negative.   EKGs/Labs/Other Studies Reviewed:    The following studies were reviewed today: Cardiac catheterization and operative report from bypass surgery 2011 Nuclear stress test 2014  EKG:  EKG is not ordered today.  ECG tracing from his ED visit on 09/13/2021 shows normal sinus rhythm, LVH by voltage criteria only with a QRS duration of 112 ms, shorter than it was last year.  There are no ischemic repolarization abnormalities. Recent Labs: February 29, 2020 Creatinine 1.29, hemoglobin A1c 5.9%, normal liver function tests, TSH 4.42   BMET    Component Value Date/Time   NA 141 09/13/2021 1307   K 4.1 09/13/2021 1307   CL 106 09/13/2021 1307   CO2 27 09/13/2021 1307   GLUCOSE 103 (H) 09/13/2021 1307   BUN 15 09/13/2021 1307   CREATININE 1.10 09/13/2021 1307   CALCIUM 9.3 09/13/2021 1307   GFRNONAA >60 09/13/2021 1307   GFRAA >60 08/31/2017 1400    Recent Lipid Panel 09/06/2021 Total cholesterol 170, HDL 40, LDL 95 , triglycerides 112  Lipid Panel  No results found for: CHOL, TRIG, HDL, CHOLHDL, VLDL, LDLCALC, LDLDIRECT, LABVLDL   Physical Exam:    VS:  BP 130/76   Pulse  79   Ht 5\' 11"  (1.803 m)   Wt 190 lb (86.2 kg)   SpO2 99%   BMI 26.50 kg/m     Wt Readings from Last 3 Encounters:  10/06/21 190 lb (86.2 kg)  09/12/20 178 lb (80.7 kg)  08/10/20 180 lb (81.6 kg)     General: Alert, oriented x3, no distress, appears well Head: no evidence of trauma, PERRL, EOMI, no exophtalmos or lid lag, no myxedema, no xanthelasma; normal ears, nose and oropharynx Neck: normal jugular venous pulsations and no hepatojugular reflux; brisk carotid pulses without delay and no carotid bruits Chest: clear to auscultation, no signs of consolidation by percussion or palpation, normal fremitus, symmetrical and full respiratory excursions Cardiovascular: normal position and quality of the apical impulse, regular rhythm, normal first and second heart sounds, no murmurs, rubs or gallops Abdomen: no tenderness or distention, no masses by palpation, no abnormal pulsatility or arterial bruits, normal bowel sounds, no hepatosplenomegaly Extremities: no clubbing, cyanosis or edema; 2+ radial, ulnar and brachial pulses bilaterally; 2+ right femoral, posterior tibial and dorsalis pedis pulses; 2+ left femoral, posterior tibial and dorsalis pedis pulses; no subclavian or femoral bruits Neurological: grossly nonfocal Psych: Normal mood and affect    ASSESSMENT:    1. Coronary artery disease involving native coronary artery of native heart without angina pectoris   2. Hypercholesterolemia   3. History of ventricular fibrillation   4. LBBB (left bundle branch block)   5. Dizziness     PLAN:    In order of problems listed above:  CAD: Does not have angina pectoris (previous angina was intrascapular pain).  Less physically active due to his issues with dizziness.  Very physically active and asymptomatic, with rather atypical presentations in the past, associated with arrhythmia more than typical angina (this was probably intrascapular discomfort).  Now roughly 11 years following  four-vessel bypass surgery.  On aspirin, statin.  No longer on beta-blockers.  Left ventricular systolic function has not been reevaluated since his nuclear stress test in 2014 when LVEF was normal at 57%.  Would like to  recheck an echocardiogram. HLP: Ideally we would get his LDL less than 70, but he has not tolerated more potent statins. Only able to tolerate pravastatin in a very low dose. Discussed newer lipid-lowering drugs but he is not interested. He is going to "stick to the old guidelines" for an LDL of 100.  He is not interested in newer lipid-lowering medications. VF: This occurred in the setting of acute myocardial infarction with heavy thrombus burden in the right coronary artery and percutaneous intervention in 2006.  He has not had significant problems with palpitations or syncope since that time.  He does have occasional PVCs on follow up event monitor. LBBB: This appears to be intermittent, may be rate related.  Most recent ECG actually shows a narrower QRS at only 112 ms.  Over the years he has steadily developed an intraventricular conduction abnormality and sinus bradycardia. Beta-blockers have been stopped. He does not have any symptoms of bradycardia and a pacemaker is not indicated at this time. Dizziness: This appears to occur only with changes in position and sounds like orthostatic hypotension.  He does not have random episodes of dizziness or presyncope/syncope that are not associated with changes in position.  This makes it unlikely to have an arrhythmic mechanism.  He does not really have a history of significant hypertension When checked with a manual sphygmomanometer today his blood pressure is quite normal.  I do not think he needs antihypertensive medication.  He should stay well-hydrated.   Medication Adjustments/Labs and Tests Ordered: Current medicines are reviewed at length with the patient today.  Concerns regarding medicines are outlined above.  No orders of the  defined types were placed in this encounter.  No orders of the defined types were placed in this encounter.   Patient Instructions  Medication Instructions:  No changes *If you need a refill on your cardiac medications before your next appointment, please call your pharmacy*   Lab Work: None ordered If you have labs (blood work) drawn today and your tests are completely normal, you will receive your results only by: [place] (if you have MyChart) OR A paper copy in the mail If you have any lab test that is abnormal or we need to change your treatment, we will call you to review the results.   Testing/Procedures: None ordered   Follow-Up: At [facility], you and your health needs are our priority.  As part of our continuing mission to provide you with exceptional heart care, we have created designated Provider Care Teams.  These Care Teams include your primary Cardiologist (physician) and Advanced Practice Providers (APPs -  Physician Assistants and Nurse Practitioners) who all work together to provide you with the care you need, when you need it.  We recommend signing up for the patient portal called "MyChart".  Sign up information is provided on this After Visit Summary.  MyChart is used to connect with patients for Virtual Visits (Telemedicine).  Patients are able to view lab/test results, encounter notes, upcoming appointments, etc.  Non-urgent messages can be sent to your provider as well.   To learn more about what you can do with MyChart, go to [email or web address].    Your next appointment:   12 month(s)  The format for your next appointment:   In Person  Provider:   Sanda Klein, MD      Signed, Sanda Klein, MD  10/08/2021 11:37 AM    [place]

## 2021-10-08 ENCOUNTER — Encounter: Payer: Self-pay | Admitting: Cardiovascular Disease

## 2021-10-12 ENCOUNTER — Telehealth: Payer: Self-pay | Admitting: Cardiovascular Disease

## 2021-10-12 NOTE — Telephone Encounter (Signed)
Returned call to patient, patient states he thought he had an echo completed in the ED recently and was unsure why he needed another one.  Advised EKG was completed but echocardiogram was not.     OV 11/4 Per Dr. Sallyanne Kuster: Left ventricular systolic function has not been reevaluated since his nuclear stress test in 2014 when LVEF was normal at 57%.  Would like to recheck an echocardiogram  Discussed with patient, patient verbalized understanding.   Advised scheduling team will call to arrange.

## 2021-10-12 NOTE — Telephone Encounter (Signed)
Patient was calling back for update on echo, he was under the understanding that he didn't need one. Please advise

## 2021-10-18 ENCOUNTER — Ambulatory Visit (HOSPITAL_COMMUNITY): Payer: Medicare Other | Attending: Cardiovascular Disease

## 2021-10-18 ENCOUNTER — Other Ambulatory Visit: Payer: Self-pay

## 2021-10-18 DIAGNOSIS — I2510 Atherosclerotic heart disease of native coronary artery without angina pectoris: Secondary | ICD-10-CM | POA: Insufficient documentation

## 2021-10-18 LAB — ECHOCARDIOGRAM COMPLETE
Area-P 1/2: 2.54 cm2
P 1/2 time: 596 msec
S' Lateral: 3.73 cm

## 2021-10-20 DIAGNOSIS — M6281 Muscle weakness (generalized): Secondary | ICD-10-CM | POA: Diagnosis not present

## 2021-10-24 DIAGNOSIS — M6281 Muscle weakness (generalized): Secondary | ICD-10-CM | POA: Diagnosis not present

## 2021-11-01 DIAGNOSIS — M6281 Muscle weakness (generalized): Secondary | ICD-10-CM | POA: Diagnosis not present

## 2021-11-03 DIAGNOSIS — M6281 Muscle weakness (generalized): Secondary | ICD-10-CM | POA: Diagnosis not present

## 2021-11-07 DIAGNOSIS — M6281 Muscle weakness (generalized): Secondary | ICD-10-CM | POA: Diagnosis not present

## 2021-11-09 DIAGNOSIS — M6281 Muscle weakness (generalized): Secondary | ICD-10-CM | POA: Diagnosis not present

## 2022-03-14 DIAGNOSIS — E039 Hypothyroidism, unspecified: Secondary | ICD-10-CM | POA: Diagnosis not present

## 2022-03-14 DIAGNOSIS — E785 Hyperlipidemia, unspecified: Secondary | ICD-10-CM | POA: Diagnosis not present

## 2022-03-14 DIAGNOSIS — R7309 Other abnormal glucose: Secondary | ICD-10-CM | POA: Diagnosis not present

## 2022-03-14 DIAGNOSIS — Z0000 Encounter for general adult medical examination without abnormal findings: Secondary | ICD-10-CM | POA: Diagnosis not present

## 2022-04-16 DIAGNOSIS — I10 Essential (primary) hypertension: Secondary | ICD-10-CM | POA: Diagnosis not present

## 2022-04-16 DIAGNOSIS — E039 Hypothyroidism, unspecified: Secondary | ICD-10-CM | POA: Diagnosis not present

## 2022-04-16 DIAGNOSIS — E785 Hyperlipidemia, unspecified: Secondary | ICD-10-CM | POA: Diagnosis not present

## 2022-04-16 DIAGNOSIS — R7309 Other abnormal glucose: Secondary | ICD-10-CM | POA: Diagnosis not present

## 2022-04-16 DIAGNOSIS — I2510 Atherosclerotic heart disease of native coronary artery without angina pectoris: Secondary | ICD-10-CM | POA: Diagnosis not present

## 2022-04-16 DIAGNOSIS — Z0000 Encounter for general adult medical examination without abnormal findings: Secondary | ICD-10-CM | POA: Diagnosis not present

## 2022-10-17 DIAGNOSIS — E785 Hyperlipidemia, unspecified: Secondary | ICD-10-CM | POA: Diagnosis not present

## 2022-10-17 DIAGNOSIS — R7309 Other abnormal glucose: Secondary | ICD-10-CM | POA: Diagnosis not present

## 2022-10-17 DIAGNOSIS — E039 Hypothyroidism, unspecified: Secondary | ICD-10-CM | POA: Diagnosis not present

## 2022-10-24 DIAGNOSIS — E7800 Pure hypercholesterolemia, unspecified: Secondary | ICD-10-CM | POA: Diagnosis not present

## 2022-10-24 DIAGNOSIS — E039 Hypothyroidism, unspecified: Secondary | ICD-10-CM | POA: Diagnosis not present

## 2022-10-24 DIAGNOSIS — R7309 Other abnormal glucose: Secondary | ICD-10-CM | POA: Diagnosis not present

## 2022-10-24 DIAGNOSIS — I129 Hypertensive chronic kidney disease with stage 1 through stage 4 chronic kidney disease, or unspecified chronic kidney disease: Secondary | ICD-10-CM | POA: Diagnosis not present

## 2022-10-24 DIAGNOSIS — I2510 Atherosclerotic heart disease of native coronary artery without angina pectoris: Secondary | ICD-10-CM | POA: Diagnosis not present

## 2022-11-17 DIAGNOSIS — J029 Acute pharyngitis, unspecified: Secondary | ICD-10-CM | POA: Diagnosis not present

## 2022-12-26 DIAGNOSIS — M7661 Achilles tendinitis, right leg: Secondary | ICD-10-CM | POA: Diagnosis not present

## 2023-01-03 DIAGNOSIS — H2513 Age-related nuclear cataract, bilateral: Secondary | ICD-10-CM | POA: Diagnosis not present

## 2023-01-09 NOTE — Progress Notes (Signed)
Cardiology Office Note:    Date:  01/22/2023   ID:  LINVEL SCHWENKER, DOB [birth year], MRN [id number]  PCP:  Fatima Sanger, FNP  Cardiologist:  Thurmon Fair, MD  Electrophysiologist:  None   Referring MD: Fatima Sanger, FNP   Chief Complaint: routine follow-up of CAD  History of Present Illness:    Jose [name] is a 87 y.o. male with a history of CAD s/p prior PCI of RCA and LAD and then CABG x4 (LIMA-LAD, SVG-PDA, sequential SVG-OM1-OM2) in 2011, VF arrest in setting of acute MI in 2006, LBBB, hyperlipidemia, and GERD who is followed by Dr. Royann Shivers and presents today for routine follow-up.   Patient was previously followed by Dr. Jacinto Halim and now follows with Dr. Royann Shivers. He has a history of remote MI in 1999 s/p angioplasty of mid RCA. He had another MI in 09/2005 which was complicated by a ventricular fibrillation cardiac arrest requiring defibrillation. LHC at that time showed occluded distal RCA with heavy thrombus burden, 90% stenosis of the LAD after the origin of the 3rd Diag, 99% stenosis of ostial 2nd Diag (although this was a very small vessel), and mild luminal irregularities of the LCX. LVEF of 55-60%. He underwent successful rheolytic thrombectomy followed by successful PTCA and DES of distal RCA. He was taken back to the cath lab a few days later for for staged PCI with DES of the mid LAD. He subsequently underwent CABG x4 with LIMA to LAD, SVG to PDA, sequential SVG to OM1 and OM2 in 2011. Last ischemic evaluation was a Myoview in 10/2013 and showed no evidence of reversible ischemia or infarction. Telemetry at that time showed short bursts of NSVT but outpatient Event Monitor following this showed no significant arrhythmias.   Patient was last seen by Dr. Royann Shivers in 10/2021 at which time he reported some falls when he bends over and reported dizziness with position changes. He stated these symptoms worsened after he was prescribed Prednisone for a gout attack. He  was otherwise doing well from a cardiac standpoint. Dizziness sounded orthostatic in nature. He was advised to stay well hydrated. Echo was ordered and showed LVEF of 45-50% with akinesis of the inferior and inferolateral walls as well as grade 1 diastolic dysfunction. Also showed mild dilatation of the aortic root measuring 39 mm and moderate dilatation of the ascending aorta measuring 46 mm. Wall motion abnormalities were consistent with prior infarction in the RCA distribution.   Patient presents today for follow-up. Here alone. Patient is doing well from a cardiac standpoint. He denies any chest pain, shortness of breath, orthopnea, PND, significant lower extremity edema, palpitations, lightheadedness, dizziness, or syncope. He is compliant with his medications and is tolerating them well.   Past Medical History:  Diagnosis Date   Complication of anesthesia    "hard time waking up from it alot of times" (10/09/2013)   Coronary artery disease    hx CABG 2011   GERD (gastroesophageal reflux disease)    "not in >30 yr" (10/09/2013)   Gout    H/O cardiovascular stress test 2011     mild to mod ischemia this led to cath and CABG   Hx of echocardiogram 2010   EF >55%, mild to mod MR, mild aortic reg.    Hyperlipidemia LDL goal <70    Hypertension    Kidney stone    Myocardial infarction [facility]) 1999; 2006   both acute inferior wall   Skin cancer    "right  nose, lower lip, right ankle, left side" (10/09/2013)   Ventricular fibrillation (HCC) 2006   arrest with MI    Past Surgical History:  Procedure Laterality Date   BACK SURGERY     CORONARY ANGIOPLASTY WITH STENT PLACEMENT  1999   Stent to RCA   CORONARY ANGIOPLASTY WITH STENT PLACEMENT  2006   stent to distal RCA   CORONARY ANGIOPLASTY WITH STENT PLACEMENT  2006   mid LAD with Cypher stent   CORONARY ARTERY BYPASS GRAFT  2011   LIMA-LAD; seq VG-1st & 2nd OM of LCX; VG-PDA of RCA   CYSTOSCOPY     "had alot of laser for kidney  stones" (10/09/2013)   CYSTOSCOPY W/ STONE MANIPULATION  12/2003   I & D EXTREMITY  06/18/2012   Procedure: IRRIGATION AND DEBRIDEMENT EXTREMITY;  Surgeon: Sharma Covert, MD;  Location: [facility] OR;  Service: Orthopedics;  Laterality: Left;  I & D left thumb    LITHOTRIPSY     "several times" (10/09/2013)   LUMBAR DISC SURGERY  1998   "disc ruptured" (10/09/2013)   SKIN CANCER EXCISION     "right nose, lower lip; right ankle, left side" (10/09/2013)    Current Medications: Current Meds  Medication Sig   amLODipine (NORVASC) 10 MG tablet Take 10 mg by mouth daily.   aspirin EC 81 MG tablet Take 81 mg by mouth daily.   EPINEPHrine 0.3 mg/0.3 mL IJ SOAJ injection Inject 0.3 mg into the muscle once as needed (allergic reaction).   omeprazole (PRILOSEC) 20 MG capsule TAKE 1 CAPSULE BY MOUTH EVERY DAY (Patient taking differently: Take 20 mg by mouth daily.)   pravastatin (PRAVACHOL) 10 MG tablet Take 10 mg by mouth at bedtime.      Allergies:   Yellow jacket venom [bee venom], Benicar [olmesartan], Cozaar [losartan], Plavix [clopidogrel], Relafen [nabumetone], Statins, and Tekturna [aliskiren]   Social History   Socioeconomic History   Marital status: Married    Spouse name: Not on file   Number of children: Not on file   Years of education: Not on file   Highest education level: Not on file  Occupational History   Not on file  Tobacco Use   Smoking status: Never   Smokeless tobacco: Never  Vaping Use   Vaping Use: Never used  Substance and Sexual Activity   Alcohol use: Yes    Comment: 10/09/2013 "I drink a beer occasionally; not regular"   Drug use: No   Sexual activity: Yes  Other Topics Concern   Not on file  Social History Narrative   Not on file   Social Determinants of Health   Financial Resource Strain: Not on file  Food Insecurity: Not on file  Transportation Needs: Not on file  Physical Activity: Not on file  Stress: Not on file  Social Connections: Not on file      Family History: The patient's family history includes Cancer in his brother; Colon cancer in his sister; Diabetes in his mother; Heart attack in his father.  ROS:   Please see the history of present illness.     EKGs/Labs/Other Studies Reviewed:    The following studies were reviewed:  Myoview 10/10/2013: Impressions: 1. No reversible ischemia or infarction. 2. Septal hypokinesia  3. Ejection fraction equals 57 %  _______________  Echocardiogram 10/18/2021: Impressions: 1. Left ventricular ejection fraction, by estimation, is 45 to 50%. Left  ventricular ejection fraction by 3D volume is 53 %. The left ventricle has  mildly decreased  function. The left ventricle demonstrates regional wall  motion abnormalities (see  scoring diagram/findings for description). Left ventricular diastolic  parameters are consistent with Grade I diastolic dysfunction (impaired  relaxation). There is akinesis of the left ventricular, basal inferior  wall and inferolateral wall.   2. Right ventricular systolic function is normal. The right ventricular  size is normal. There is normal pulmonary artery systolic pressure.   3. Left atrial size was moderately dilated.   4. The mitral valve is normal in structure. Trivial mitral valve  regurgitation.   5. The aortic valve is tricuspid. Aortic valve regurgitation is mild. No  aortic stenosis is present.   6. Aortic dilatation noted. There is mild dilatation of the aortic root,  measuring 39 mm. There is moderate dilatation of the ascending aorta,  measuring 46 mm.   7. The inferior vena cava is normal in size with greater than 50%  respiratory variability, suggesting right atrial pressure of 3 mmHg.   Comparison(s): No prior Echocardiogram.   Conclusion(s)/Recommendation(s): Findings consistent with previous  infarction in the right coronary artery distribution.    EKG:  EKG ordered today. EKG personally reviewed and demonstrates normal sinus  rhythm, rate 79 bpm, with incomplete LBBB and non-specific T wave changes. Left axis deviation. Normal PR and QRS intervals. QTc 426 ms.  Recent Labs: No results found for requested labs within last 365 days.  Recent Lipid Panel No results found for: "CHOL", "TRIG", "HDL", "CHOLHDL", "VLDL", "LDLCALC", "LDLDIRECT"  Physical Exam:    Vital Signs: BP 128/68   Pulse 79   Ht 5\' 11"  (1.803 m)   Wt 180 lb (81.6 kg)   SpO2 98%   BMI 25.10 kg/m     Wt Readings from Last 3 Encounters:  01/22/23 180 lb (81.6 kg)  10/06/21 190 lb (86.2 kg)  09/12/20 178 lb (80.7 kg)     General: 87 y.o. Caucasian male in no acute distress. HEENT: Normocephalic and atraumatic. Sclera clear. . Neck: Supple. No carotid bruits. No JVD. Heart: RRR. Distinct S1 and S2. No murmurs, gallops, or rubs. Radial pulses 2+ and equal bilaterally. Lungs: No increased work of breathing. Clear to ausculation bilaterally. No wheezes, rhonchi, or rales.  Abdomen: Soft, non-distended, and non-tender to palpation.  Extremities: Trace lower extremity edema bilaterally.   Skin: Warm and dry. Neuro: Alert and oriented x3. No focal deficits. Psych: Normal affect. Responds appropriately.   Assessment:    1. Coronary artery disease involving native coronary artery of native heart without angina pectoris   2. S/P CABG (coronary artery bypass graft)   3. Ischemic cardiomyopathy   4. History of ventricular fibrillation   5. LBBB (left bundle branch block)   6. Primary hypertension   7. Hyperlipidemia, unspecified hyperlipidemia type   8. Ascending aorta dilatation (HCC)     Plan:    CAD s/p CABG Long history of CAD with prior PCI of RCA and LAD and then CABG x4 in 2011. Last ischemic evaluation in 2014 showed no evidence of ischemia.  - No chest pain. - aspirin and statin.  Ischemic Cardiomyopathy Echo in 10/2021 showed mildly reduced LVEF of 45-50% with akinesis of the inferior and inferolateral walls as well as grade  1 diastolic dysfunction. Also showed mild dilatation of the aortic root measuring 39 mm and moderate dilatation of the ascending aorta measuring 46 mm. Wall motion abnormalities were consistent with prior infarction in the RCA distribution.  - Euvolemic on exam. No signs or symptoms of CHF.  -  He has not been able to tolerate Losartan or Olmesartan in the past and is not on any beta-blockers given history of bradycardia in the past.  - EF is only mildly reduced and patient seems very hesitant to start any other medications; therefore, will not try to add any GDMT at this time.   History of Ventricular Fibrillation This occurred in setting of acute MI with heavy thrombus burden in the RCA in 2006. He had occasional PVC noted on follow-up monitor but no significant ventricular arrhythmias.  - No palpitations.   LBBB Patient has a history of intermittent LBBB. Over the years, he has steadily developed an intraventricular conduction abnormality and sinus bradycardia. Beta-blockers were stopped.  - No symptoms of bradycardia. - Pacemaker is not indicated at this time.  Hypertension BP initially elevated at 142/70 but then 128/68 on my personal recheck at the end of visit. - Continue Amlodipine 10mg  daily.   Hyperlipidemia Most recent lipid panel in 10/2022 at PCP's office: Total Cholesterol: 155, Triglycerides 85, HDL 41, LDL 97. Ideally LDL goal <70; however, he has been unable to tolerate more potent statins and has only been able to tolerate very low dose Pravastatin. Therefore, LDL goal has been <100. - Continue Pravastatin 10mg  daily.   Ascending Aorta Dilatation  Echo in 10/2021 showed mild dilatation of the aortic root measuring 39 mm and moderate dilatation of the ascending aorta measuring 46 mm.  - Recommended chest CTA for better evaluation but patient declined.   Disposition: Follow up in 1 year.    Medication Adjustments/Labs and Tests Ordered: Current medicines are reviewed at  length with the patient today.  Concerns regarding medicines are outlined above.  Orders Placed This Encounter  Procedures   EKG 12-Lead   No orders of the defined types were placed in this encounter.   Patient Instructions  Medication Instructions:  No changes *If you need a refill on your cardiac medications before your next appointment, please call your pharmacy*  Follow-Up: At [facility], you and your health needs are our priority.  As part of our continuing mission to provide you with exceptional heart care, we have created designated Provider Care Teams.  These Care Teams include your primary Cardiologist (physician) and Advanced Practice Providers (APPs -  Physician Assistants and Nurse Practitioners) who all work together to provide you with the care you need, when you need it.  We recommend signing up for the patient portal called "MyChart".  Sign up information is provided on this After Visit Summary.  MyChart is used to connect with patients for Virtual Visits (Telemedicine).  Patients are able to view lab/test results, encounter notes, upcoming appointments, etc.  Non-urgent messages can be sent to your provider as well.   To learn more about what you can do with MyChart, go to [email or web address].    Your next appointment:   1 year(s)  Provider:   Thurmon Fair, MD  or Marjie Skiff, PA-C     Signed, Corrin Parker, PA-C  01/22/2023 7:22 PM    [facility]

## 2023-01-22 ENCOUNTER — Ambulatory Visit: Payer: Medicare Other | Attending: Student | Admitting: Student

## 2023-01-22 ENCOUNTER — Encounter: Payer: Self-pay | Admitting: Student

## 2023-01-22 VITALS — BP 128/68 | HR 79 | Ht 71.0 in | Wt 180.0 lb

## 2023-01-22 DIAGNOSIS — I77810 Thoracic aortic ectasia: Secondary | ICD-10-CM | POA: Diagnosis not present

## 2023-01-22 DIAGNOSIS — E785 Hyperlipidemia, unspecified: Secondary | ICD-10-CM

## 2023-01-22 DIAGNOSIS — I255 Ischemic cardiomyopathy: Secondary | ICD-10-CM | POA: Diagnosis not present

## 2023-01-22 DIAGNOSIS — I447 Left bundle-branch block, unspecified: Secondary | ICD-10-CM | POA: Diagnosis not present

## 2023-01-22 DIAGNOSIS — I10 Essential (primary) hypertension: Secondary | ICD-10-CM | POA: Diagnosis not present

## 2023-01-22 DIAGNOSIS — Z8679 Personal history of other diseases of the circulatory system: Secondary | ICD-10-CM | POA: Diagnosis not present

## 2023-01-22 DIAGNOSIS — I2510 Atherosclerotic heart disease of native coronary artery without angina pectoris: Secondary | ICD-10-CM | POA: Diagnosis not present

## 2023-01-22 DIAGNOSIS — Z951 Presence of aortocoronary bypass graft: Secondary | ICD-10-CM | POA: Diagnosis not present

## 2023-01-22 NOTE — Patient Instructions (Signed)
Medication Instructions:  No changes *If you need a refill on your cardiac medications before your next appointment, please call your pharmacy*  Follow-Up: At [facility], you and your health needs are our priority.  As part of our continuing mission to provide you with exceptional heart care, we have created designated Provider Care Teams.  These Care Teams include your primary Cardiologist (physician) and Advanced Practice Providers (APPs -  Physician Assistants and Nurse Practitioners) who all work together to provide you with the care you need, when you need it.  We recommend signing up for the patient portal called "MyChart".  Sign up information is provided on this After Visit Summary.  MyChart is used to connect with patients for Virtual Visits (Telemedicine).  Patients are able to view lab/test results, encounter notes, upcoming appointments, etc.  Non-urgent messages can be sent to your provider as well.   To learn more about what you can do with MyChart, go to [email or web address].    Your next appointment:   1 year(s)  Provider:   Sanda Klein, MD  or Sande Rives, PA-C

## 2023-04-17 DIAGNOSIS — E785 Hyperlipidemia, unspecified: Secondary | ICD-10-CM | POA: Diagnosis not present

## 2023-04-17 DIAGNOSIS — I10 Essential (primary) hypertension: Secondary | ICD-10-CM | POA: Diagnosis not present

## 2023-04-17 DIAGNOSIS — E039 Hypothyroidism, unspecified: Secondary | ICD-10-CM | POA: Diagnosis not present

## 2023-04-17 DIAGNOSIS — R7309 Other abnormal glucose: Secondary | ICD-10-CM | POA: Diagnosis not present

## 2023-04-17 LAB — LAB REPORT - SCANNED
A1c: 5.8
EGFR: 67

## 2023-04-24 DIAGNOSIS — I10 Essential (primary) hypertension: Secondary | ICD-10-CM | POA: Diagnosis not present

## 2023-04-24 DIAGNOSIS — R7309 Other abnormal glucose: Secondary | ICD-10-CM | POA: Diagnosis not present

## 2023-04-24 DIAGNOSIS — E038 Other specified hypothyroidism: Secondary | ICD-10-CM | POA: Diagnosis not present

## 2023-04-24 DIAGNOSIS — E785 Hyperlipidemia, unspecified: Secondary | ICD-10-CM | POA: Diagnosis not present

## 2023-04-24 DIAGNOSIS — Z0000 Encounter for general adult medical examination without abnormal findings: Secondary | ICD-10-CM | POA: Diagnosis not present

## 2023-04-24 DIAGNOSIS — I2510 Atherosclerotic heart disease of native coronary artery without angina pectoris: Secondary | ICD-10-CM | POA: Diagnosis not present

## 2023-08-13 ENCOUNTER — Telehealth: Payer: Self-pay | Admitting: Emergency Medicine

## 2023-08-13 NOTE — Telephone Encounter (Signed)
This is in regards to [place] benefits. He was told by [facility] and his neighbor? 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  This is really all of the information he could give me. I asked him if he had any forms that needed to be filled out and he said that he was told that he just needs a letter.  He gave me his new number so you can also call and talk to him #[phone]  I asked him if there was any form that we can fill out and he kept saying that he just needed a letter.

## 2023-08-15 NOTE — Telephone Encounter (Signed)
Pt served in [employer] for 4 years 1956[address]1960. 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  (He reports horrible tinnitus, and cannot hear out of left ear due to service time)  Informed him that I will give all of this information to Dr Royann Shivers and we will get back in touch when the letter is completed.

## 2023-08-15 NOTE — Telephone Encounter (Signed)
Will try to get it done this week

## 2023-08-22 NOTE — Telephone Encounter (Signed)
I have the letter ready for him.

## 2023-08-23 NOTE — Telephone Encounter (Signed)
Left message stating that the letter he requested is ready for him and will be at the front desk. Left call back number if he has any questions or if he decides he needs it to be sent a different way.

## 2023-09-19 DIAGNOSIS — H9312 Tinnitus, left ear: Secondary | ICD-10-CM | POA: Diagnosis not present

## 2023-09-19 DIAGNOSIS — H90A32 Mixed conductive and sensorineural hearing loss, unilateral, left ear with restricted hearing on the contralateral side: Secondary | ICD-10-CM | POA: Diagnosis not present

## 2023-09-19 DIAGNOSIS — H90A21 Sensorineural hearing loss, unilateral, right ear, with restricted hearing on the contralateral side: Secondary | ICD-10-CM | POA: Diagnosis not present

## 2023-10-23 DIAGNOSIS — R7309 Other abnormal glucose: Secondary | ICD-10-CM | POA: Diagnosis not present

## 2023-10-24 LAB — LAB REPORT - SCANNED
A1c: 6.2
EGFR: 64

## 2023-11-07 DIAGNOSIS — I2510 Atherosclerotic heart disease of native coronary artery without angina pectoris: Secondary | ICD-10-CM | POA: Diagnosis not present

## 2023-11-07 DIAGNOSIS — R7309 Other abnormal glucose: Secondary | ICD-10-CM | POA: Diagnosis not present

## 2023-11-07 DIAGNOSIS — I10 Essential (primary) hypertension: Secondary | ICD-10-CM | POA: Diagnosis not present

## 2024-01-07 DIAGNOSIS — H43393 Other vitreous opacities, bilateral: Secondary | ICD-10-CM | POA: Diagnosis not present

## 2024-02-14 ENCOUNTER — Ambulatory Visit: Payer: Medicare Other | Attending: Cardiovascular Disease | Admitting: Cardiovascular Disease

## 2024-04-16 NOTE — Progress Notes (Unsigned)
 Cardiology Office Note:    Date:  04/17/2024   ID:  Jose [name], DOB 08-05-1936, MRN [id number]  PCP:  Jhon Moselle, FNP  Cardiologist:  Luana Rumple, MD  Electrophysiologist:  None   Referring MD: Jhon Moselle, FNP   Chief Complaint  Patient presents with   Coronary Artery Disease     History of Present Illness:    Jose [name] is a 88 y.o. male with a hx of coronary artery disease with history of previous myocardial infarction in 1999 and in 2006, the latter complicated by ventricular fibrillation cardiac arrest, previous stents to the right coronary artery and LAD artery, ultimately bypass surgery 2011 ([place], LIMA to LAD, SVG to PDA, sequential SVG to OM1-OM 2).  He has hypercholesterolemia and has been intolerant of potent statins, but able to take low-dose pravastatin.  In 2014 he underwent a nuclear perfusion study that showed normal perfusion and EF 57%.  During telemetry monitoring he had short bursts of nonsustained VT, but on the subsequent outpatient 30-day event monitor no meaningful problems were identified. Most recent echo Nov 2022 showed LVEF 45-50% basal inferolateral akinesis and moderate ascending aorta dilation at 46 mm. Head CT has demonstrated old ischemic strokes (L frontal, bilateral cerebellum).  Since his last appointment he has stopped playing golf and is using a walker.  He has become less steady on his feet and had a lot of problems with vertigo and tinnitus.  Every time he would bend over to pick up his golf ball he felt he would fall over.  He has not had full-blown syncope or serious injuries.  He denies palpitations, orthopnea, PND, lower extremity edema or intermittent claudication.  Has not had other focal neurological complaints.  He does not complain of chest pain but then again he never had angina with his previous episodes of acute coronary syndrome.  May be based on chart review he had some interscapular discomfort as his anginal  equivalent.  He has noticed enlarging varicose veins especially on the inside surface of his right thigh, but has not had any lower extremity edema or skin ulcerations  He gets a lot of his care and fills his medications at the [facility]. he worked for many years on a [employer].   His presentations with coronary events were rather atypical.  It sounds like the first time he had a syncopal event while playing golf and was referred for treadmill stress test which was abnormal.  This led to a stent in the right coronary artery.  In 2006 he presented with acute inferior myocardial infarction and ventricular fibrillation arrest and received another stent in the right coronary artery.  He denies ever experiencing chest pain, but looking back it sounds like he may have interscapular discomfort as his anginal equivalent.   Past Medical History:  Diagnosis Date   Complication of anesthesia    "hard time waking up from it alot of times" (10/09/2013)   Coronary artery disease    hx CABG 2011   GERD (gastroesophageal reflux disease)    "not in >30 yr" (10/09/2013)   Gout    H/O cardiovascular stress test 2011     mild to mod ischemia this led to cath and CABG   Hx of echocardiogram 2010   EF >55%, mild to mod MR, mild aortic reg.    Hyperlipidemia LDL goal <70    Hypertension    Kidney stone    Myocardial infarction (HCC) 1999;  2006   both acute inferior wall   Skin cancer    "right nose, lower lip, right ankle, left side" (10/09/2013)   Ventricular fibrillation (HCC) 2006   arrest with MI    Past Surgical History:  Procedure Laterality Date   BACK SURGERY     CORONARY ANGIOPLASTY WITH STENT PLACEMENT  1999   Stent to RCA   CORONARY ANGIOPLASTY WITH STENT PLACEMENT  2006   stent to distal RCA   CORONARY ANGIOPLASTY WITH STENT PLACEMENT  2006   mid LAD with Cypher stent   CORONARY ARTERY BYPASS GRAFT  2011   LIMA-LAD; seq VG-1st & 2nd OM of LCX; VG-PDA of RCA   CYSTOSCOPY      "had alot of laser for kidney stones" (10/09/2013)   CYSTOSCOPY W/ STONE MANIPULATION  12/2003   I & D EXTREMITY  06/18/2012   Procedure: IRRIGATION AND DEBRIDEMENT EXTREMITY;  Surgeon: Shellie Dials, MD;  Location: [facility] OR;  Service: Orthopedics;  Laterality: Left;  I & D left thumb    LITHOTRIPSY     "several times" (10/09/2013)   LUMBAR DISC SURGERY  1998   "disc ruptured" (10/09/2013)   SKIN CANCER EXCISION     "right nose, lower lip; right ankle, left side" (10/09/2013)    Current Medications: Current Meds  Medication Sig   amLODipine  (NORVASC ) 10 MG tablet Take 10 mg by mouth daily.   aspirin  EC 81 MG tablet Take 81 mg by mouth daily.   ezetimibe (ZETIA) 10 MG tablet Take 1 tablet (10 mg total) by mouth daily.   pravastatin (PRAVACHOL) 10 MG tablet Take 10 mg by mouth at bedtime.      Allergies:   Yellow jacket venom [bee venom], Benicar [olmesartan], Cozaar [losartan], Plavix [clopidogrel], Relafen [nabumetone], Statins, Tekturna [aliskiren], Atorvastatin, and Ramipril   Family History: The patient's family history includes Cancer in his brother; Colon cancer in his sister; Diabetes in his mother; Heart attack in his father.  ROS:   Please see the history of present illness.    All other systems are reviewed and are negative.   EKGs/Labs/Other Studies Reviewed:    The following studies were reviewed today: Cardiac catheterization and operative report from bypass surgery 2011 Nuclear stress test 2014  EKG:  EKG is not ordered today.  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  EKG Interpretation Date/Time:    Ventricular Rate:    PR Interval:    QRS Duration:    QT Interval:    QTC Calculation:   R Axis:      Text Interpretation:          Recent Labs: 10/23/2023 Glucose 166, creatinine 1.12, potassium 3.9, liver function  tests are all normal Hemoglobin A1c 6.2%  BMET    Component Value Date/Time   NA 141 09/13/2021 1307   K 4.1 09/13/2021 1307   CL 106 09/13/2021 1307   CO2 27 09/13/2021 1307   GLUCOSE 103 (H) 09/13/2021 1307   BUN 15 09/13/2021 1307   CREATININE 1.10 09/13/2021 1307   CALCIUM 9.3 09/13/2021 1307   GFRNONAA >60 09/13/2021 1307   GFRAA >60 08/31/2017 1400    Recent Lipid Panel   Lipid Panel  No results found for: "CHOL", "TRIG", "HDL", "CHOLHDL", "VLDL", "LDLCALC", "LDLDIRECT", "LABVLDL" 11-20 2024 Cholesterol 177, HDL 42, LDL 108, triglycerides 153 Hemoglobin A1c 6.2%   Physical Exam:  VS:  BP 136/74 (BP Location: Left Arm, Patient Position: Sitting, Cuff Size: Normal)   Pulse 87   Ht 5\' 11"  (1.803 m)   Wt 82.7 kg   SpO2 95%   BMI 25.44 kg/m     Wt Readings from Last 3 Encounters:  04/17/24 82.7 kg  01/22/23 81.6 kg  10/06/21 86.2 kg     General: Alert, oriented x3, no distress, Head: no evidence of trauma, PERRL, EOMI, no exophtalmos or lid lag, no myxedema, no xanthelasma; normal ears, nose and oropharynx Neck: normal jugular venous pulsations and no hepatojugular reflux; brisk carotid pulses without delay and no carotid bruits Chest: clear to auscultation, no signs of consolidation by percussion or palpation, normal fremitus, symmetrical and full respiratory excursions Cardiovascular: normal position and quality of the apical impulse, regular rhythm, normal first and second heart sounds, no murmurs, rubs or gallops Abdomen: no tenderness or distention, no masses by palpation, no abnormal pulsatility or arterial bruits, normal bowel sounds, no hepatosplenomegaly Extremities: no clubbing, cyanosis or edema; 2+ radial, ulnar and brachial pulses bilaterally; 2+ right femoral, posterior tibial and dorsalis pedis pulses; 2+ left femoral, posterior tibial and dorsalis pedis pulses; no subclavian or femoral bruits Neurological: grossly nonfocal Psych: Normal mood and  affect     ASSESSMENT:    1. Coronary artery disease involving native coronary artery of native heart without angina pectoris   2. Hyperlipidemia LDL goal <70   3. History of ventricular fibrillation   4. LBBB (left bundle branch block)   5. Essential hypertension   6. Dizziness      PLAN:    In order of problems listed above:  CAD: Currently does not have any symptoms of suggest angina (previous angina may have been intrascapular pain).  However, he is quite sedentary and had rather atypical presentations in the past, associated with arrhythmia more than typical angina.  On aspirin , very low-dose statin.  No longer on beta-blockers.  Most recent functional study was an echocardiogram in November 2022 which showed LVEF around 50% with akinesis of the basal inferior/inferolateral wall. HLP: He has not tolerated more potent statins or higher doses of pravastatin.  He does not want to consider any injectable medication such as PCSK9 inhibitors or Leqvio.  He is agreeable to at least starting ezetimibe 10 mg once daily, although this is unlikely to bring his LDL cholesterol down to our target of less than 70.  Recheck labs when he goes back to his primary care provider towards the end of this year. VF: This occurred in the setting of acute myocardial infarction with heavy thrombus burden in the right coronary artery and percutaneous intervention in 2006.  He has not had significant problems with palpitations or syncope since that time.  He does have occasional PVCs on follow up event monitor. LBBB: He has an atypical IVCD on his most recent electrocardiogram, more closely resembling a left anterior fascicular block and not meeting QRS duration for a full left bundle branch block.  LBB is intermittent and may be rate related.   Over the years he has steadily developed an intraventricular conduction abnormality and sinus bradycardia.  For this reason beta-blockers were stopped, but today his heart  rate is actually in the 80s.   Dizziness: Some of the features suggested orthostatic hypotension, but most of the description is more consistent with a vestibular issue.  The only medication with hemodynamic impact that he is currently taking is amlodipine  10 mg once daily.   HTN:  Blood pressure appears to be well-controlled on the amlodipine  and we have not been able to demonstrate significant orthostatic hypotension in the clinic..  Medication Adjustments/Labs and Tests Ordered: Current medicines are reviewed at length with the patient today.  Concerns regarding medicines are outlined above.  No orders of the defined types were placed in this encounter.   Meds ordered this encounter  Medications   ezetimibe (ZETIA) 10 MG tablet    Sig: Take 1 tablet (10 mg total) by mouth daily.    Dispense:  90 tablet    Refill:  3     Patient Instructions  Medication Instructions:  START Zetia 10 mg take one tablet by mouth daily  *If you need a refill on your cardiac medications before your next appointment, please call your pharmacy*  Follow-Up: At [facility], you and your health needs are our priority.  As part of our continuing mission to provide you with exceptional heart care, our providers are all part of one team.  This team includes your primary Cardiologist (physician) and Advanced Practice Providers or APPs (Physician Assistants and Nurse Practitioners) who all work together to provide you with the care you need, when you need it.  Your next appointment:   1 year(s)  Provider:   Luana Rumple, MD    We recommend signing up for the patient portal called "MyChart".  Sign up information is provided on this After Visit Summary.  MyChart is used to connect with patients for Virtual Visits (Telemedicine).  Patients are able to view lab/test results, encounter notes, upcoming appointments, etc.  Non-urgent messages can be sent to your provider as well.   To learn more about what  you can do with MyChart, go to [email or web address].        Signed, Luana Rumple, MD  04/17/2024 4:26 PM    [facility]

## 2024-04-17 ENCOUNTER — Encounter: Payer: Self-pay | Admitting: Cardiovascular Disease

## 2024-04-17 ENCOUNTER — Ambulatory Visit: Attending: Cardiovascular Disease | Admitting: Cardiovascular Disease

## 2024-04-17 VITALS — BP 136/74 | HR 87 | Ht 71.0 in | Wt 182.4 lb

## 2024-04-17 DIAGNOSIS — I2510 Atherosclerotic heart disease of native coronary artery without angina pectoris: Secondary | ICD-10-CM

## 2024-04-17 DIAGNOSIS — E785 Hyperlipidemia, unspecified: Secondary | ICD-10-CM | POA: Diagnosis not present

## 2024-04-17 DIAGNOSIS — I10 Essential (primary) hypertension: Secondary | ICD-10-CM | POA: Diagnosis not present

## 2024-04-17 DIAGNOSIS — Z8679 Personal history of other diseases of the circulatory system: Secondary | ICD-10-CM | POA: Diagnosis not present

## 2024-04-17 DIAGNOSIS — I447 Left bundle-branch block, unspecified: Secondary | ICD-10-CM | POA: Diagnosis not present

## 2024-04-17 DIAGNOSIS — R42 Dizziness and giddiness: Secondary | ICD-10-CM | POA: Diagnosis not present

## 2024-04-17 MED ORDER — EZETIMIBE 10 MG PO TABS: 10.0000 mg | ORAL_TABLET | Freq: Every day | ORAL | 3 refills | Status: AC

## 2024-04-17 NOTE — Patient Instructions (Signed)
 Medication Instructions:  START Zetia 10 mg take one tablet by mouth daily  *If you need a refill on your cardiac medications before your next appointment, please call your pharmacy*  Follow-Up: At [facility], you and your health needs are our priority.  As part of our continuing mission to provide you with exceptional heart care, our providers are all part of one team.  This team includes your primary Cardiologist (physician) and Advanced Practice Providers or APPs (Physician Assistants and Nurse Practitioners) who all work together to provide you with the care you need, when you need it.  Your next appointment:   1 year(s)  Provider:   Luana Rumple, MD    We recommend signing up for the patient portal called "MyChart".  Sign up information is provided on this After Visit Summary.  MyChart is used to connect with patients for Virtual Visits (Telemedicine).  Patients are able to view lab/test results, encounter notes, upcoming appointments, etc.  Non-urgent messages can be sent to your provider as well.   To learn more about what you can do with MyChart, go to [email or web address].

## 2024-04-22 DIAGNOSIS — E7800 Pure hypercholesterolemia, unspecified: Secondary | ICD-10-CM | POA: Diagnosis not present

## 2024-04-22 DIAGNOSIS — E785 Hyperlipidemia, unspecified: Secondary | ICD-10-CM | POA: Diagnosis not present

## 2024-04-22 DIAGNOSIS — E039 Hypothyroidism, unspecified: Secondary | ICD-10-CM | POA: Diagnosis not present

## 2024-04-22 DIAGNOSIS — I10 Essential (primary) hypertension: Secondary | ICD-10-CM | POA: Diagnosis not present

## 2024-04-22 DIAGNOSIS — R7309 Other abnormal glucose: Secondary | ICD-10-CM | POA: Diagnosis not present

## 2024-04-22 LAB — LAB REPORT - SCANNED
EGFR (Non-African Amer.): 61
TSH: 3.75 (ref 0.41–5.90)

## 2024-04-29 DIAGNOSIS — R7309 Other abnormal glucose: Secondary | ICD-10-CM | POA: Diagnosis not present

## 2024-04-29 DIAGNOSIS — Z0000 Encounter for general adult medical examination without abnormal findings: Secondary | ICD-10-CM | POA: Diagnosis not present

## 2024-04-29 DIAGNOSIS — I10 Essential (primary) hypertension: Secondary | ICD-10-CM | POA: Diagnosis not present

## 2024-04-29 DIAGNOSIS — E785 Hyperlipidemia, unspecified: Secondary | ICD-10-CM | POA: Diagnosis not present

## 2024-04-29 DIAGNOSIS — I2510 Atherosclerotic heart disease of native coronary artery without angina pectoris: Secondary | ICD-10-CM | POA: Diagnosis not present

## 2024-04-29 DIAGNOSIS — Z951 Presence of aortocoronary bypass graft: Secondary | ICD-10-CM | POA: Diagnosis not present

## 2024-04-30 ENCOUNTER — Ambulatory Visit: Payer: Self-pay | Admitting: Cardiology

## 2024-08-23 ENCOUNTER — Other Ambulatory Visit: Payer: Self-pay

## 2024-08-23 ENCOUNTER — Inpatient Hospital Stay (HOSPITAL_COMMUNITY)
Admission: EM | Admit: 2024-08-23 | Discharge: 2024-09-02 | DRG: 177 | Disposition: A | Discharge status: Skilled Nursing Facility | Attending: Internal Medicine | Admitting: Internal Medicine

## 2024-08-23 ENCOUNTER — Encounter (HOSPITAL_COMMUNITY): Payer: Self-pay

## 2024-08-23 ENCOUNTER — Emergency Department (HOSPITAL_COMMUNITY)

## 2024-08-23 DIAGNOSIS — R0602 Shortness of breath: Secondary | ICD-10-CM | POA: Diagnosis not present

## 2024-08-23 DIAGNOSIS — Z79899 Other long term (current) drug therapy: Secondary | ICD-10-CM | POA: Diagnosis not present

## 2024-08-23 DIAGNOSIS — N1831 Chronic kidney disease, stage 3a: Secondary | ICD-10-CM | POA: Diagnosis not present

## 2024-08-23 DIAGNOSIS — R1312 Dysphagia, oropharyngeal phase: Secondary | ICD-10-CM | POA: Diagnosis present

## 2024-08-23 DIAGNOSIS — Z888 Allergy status to other drugs, medicaments and biological substances status: Secondary | ICD-10-CM

## 2024-08-23 DIAGNOSIS — Z951 Presence of aortocoronary bypass graft: Secondary | ICD-10-CM | POA: Diagnosis not present

## 2024-08-23 DIAGNOSIS — R627 Adult failure to thrive: Secondary | ICD-10-CM | POA: Diagnosis present

## 2024-08-23 DIAGNOSIS — Z91030 Bee allergy status: Secondary | ICD-10-CM

## 2024-08-23 DIAGNOSIS — Z800 Family history of malignant neoplasm of digestive organs: Secondary | ICD-10-CM | POA: Diagnosis not present

## 2024-08-23 DIAGNOSIS — K219 Gastro-esophageal reflux disease without esophagitis: Secondary | ICD-10-CM | POA: Diagnosis present

## 2024-08-23 DIAGNOSIS — E876 Hypokalemia: Secondary | ICD-10-CM | POA: Diagnosis present

## 2024-08-23 DIAGNOSIS — I5042 Chronic combined systolic (congestive) and diastolic (congestive) heart failure: Secondary | ICD-10-CM | POA: Diagnosis present

## 2024-08-23 DIAGNOSIS — U071 COVID-19: Secondary | ICD-10-CM | POA: Diagnosis not present

## 2024-08-23 DIAGNOSIS — Z833 Family history of diabetes mellitus: Secondary | ICD-10-CM

## 2024-08-23 DIAGNOSIS — I252 Old myocardial infarction: Secondary | ICD-10-CM

## 2024-08-23 DIAGNOSIS — Z7982 Long term (current) use of aspirin: Secondary | ICD-10-CM | POA: Diagnosis not present

## 2024-08-23 DIAGNOSIS — Z955 Presence of coronary angioplasty implant and graft: Secondary | ICD-10-CM | POA: Diagnosis not present

## 2024-08-23 DIAGNOSIS — Y92008 Other place in unspecified non-institutional (private) residence as the place of occurrence of the external cause: Secondary | ICD-10-CM

## 2024-08-23 DIAGNOSIS — I130 Hypertensive heart and chronic kidney disease with heart failure and stage 1 through stage 4 chronic kidney disease, or unspecified chronic kidney disease: Secondary | ICD-10-CM | POA: Diagnosis not present

## 2024-08-23 DIAGNOSIS — I2510 Atherosclerotic heart disease of native coronary artery without angina pectoris: Secondary | ICD-10-CM | POA: Diagnosis not present

## 2024-08-23 DIAGNOSIS — J9601 Acute respiratory failure with hypoxia: Secondary | ICD-10-CM | POA: Diagnosis present

## 2024-08-23 DIAGNOSIS — Z886 Allergy status to analgesic agent status: Secondary | ICD-10-CM | POA: Diagnosis not present

## 2024-08-23 DIAGNOSIS — W19XXXA Unspecified fall, initial encounter: Secondary | ICD-10-CM

## 2024-08-23 DIAGNOSIS — E785 Hyperlipidemia, unspecified: Secondary | ICD-10-CM | POA: Diagnosis present

## 2024-08-23 DIAGNOSIS — R531 Weakness: Secondary | ICD-10-CM | POA: Diagnosis not present

## 2024-08-23 DIAGNOSIS — R5383 Other fatigue: Secondary | ICD-10-CM | POA: Diagnosis not present

## 2024-08-23 DIAGNOSIS — W109XXA Fall (on) (from) unspecified stairs and steps, initial encounter: Secondary | ICD-10-CM | POA: Diagnosis present

## 2024-08-23 DIAGNOSIS — I6782 Cerebral ischemia: Secondary | ICD-10-CM | POA: Diagnosis not present

## 2024-08-23 DIAGNOSIS — R059 Cough, unspecified: Secondary | ICD-10-CM | POA: Diagnosis not present

## 2024-08-23 DIAGNOSIS — R7989 Other specified abnormal findings of blood chemistry: Secondary | ICD-10-CM | POA: Diagnosis not present

## 2024-08-23 DIAGNOSIS — M542 Cervicalgia: Secondary | ICD-10-CM | POA: Diagnosis not present

## 2024-08-23 DIAGNOSIS — J1282 Pneumonia due to coronavirus disease 2019: Secondary | ICD-10-CM | POA: Diagnosis present

## 2024-08-23 DIAGNOSIS — Z85828 Personal history of other malignant neoplasm of skin: Secondary | ICD-10-CM | POA: Diagnosis not present

## 2024-08-23 DIAGNOSIS — R9089 Other abnormal findings on diagnostic imaging of central nervous system: Secondary | ICD-10-CM | POA: Diagnosis not present

## 2024-08-23 DIAGNOSIS — Z8249 Family history of ischemic heart disease and other diseases of the circulatory system: Secondary | ICD-10-CM | POA: Diagnosis not present

## 2024-08-23 DIAGNOSIS — Z743 Need for continuous supervision: Secondary | ICD-10-CM | POA: Diagnosis not present

## 2024-08-23 DIAGNOSIS — J159 Unspecified bacterial pneumonia: Secondary | ICD-10-CM | POA: Diagnosis present

## 2024-08-23 LAB — COMPREHENSIVE METABOLIC PANEL WITH GFR
ALT: 17 U/L (ref 0–44)
AST: 37 U/L (ref 15–41)
Albumin: 3.6 g/dL (ref 3.5–5.0)
Alkaline Phosphatase: 47 U/L (ref 38–126)
Anion gap: 13 (ref 5–15)
BUN: 19 mg/dL (ref 8–23)
CO2: 24 mmol/L (ref 22–32)
Calcium: 8.7 mg/dL — ABNORMAL LOW (ref 8.9–10.3)
Chloride: 100 mmol/L (ref 98–111)
Creatinine, Ser: 1.27 mg/dL — ABNORMAL HIGH (ref 0.61–1.24)
GFR, Estimated: 55 mL/min — ABNORMAL LOW (ref >60)
Glucose, Bld: 129 mg/dL — ABNORMAL HIGH (ref 70–99)
Potassium: 4.1 mmol/L (ref 3.5–5.1)
Sodium: 137 mmol/L (ref 135–145)
Total Bilirubin: 0.9 mg/dL (ref 0.0–1.2)
Total Protein: 7.1 g/dL (ref 6.5–8.1)

## 2024-08-23 LAB — CBC WITH DIFFERENTIAL/PLATELET
Abs Immature Granulocytes: 0.04 K/uL (ref 0.00–0.07)
Basophils Absolute: 0 K/uL (ref 0.0–0.1)
Basophils Relative: 0 %
Eosinophils Absolute: 0 K/uL (ref 0.0–0.5)
Eosinophils Relative: 0 %
HCT: 47.2 % (ref 39.0–52.0)
Hemoglobin: 15.6 g/dL (ref 13.0–17.0)
Immature Granulocytes: 1 %
Lymphocytes Relative: 6 %
Lymphs Abs: 0.5 K/uL — ABNORMAL LOW (ref 0.7–4.0)
MCH: 31.3 pg (ref 26.0–34.0)
MCHC: 33.1 g/dL (ref 30.0–36.0)
MCV: 94.6 fL (ref 80.0–100.0)
Monocytes Absolute: 0.5 K/uL (ref 0.1–1.0)
Monocytes Relative: 6 %
Neutro Abs: 7.1 K/uL (ref 1.7–7.7)
Neutrophils Relative %: 87 %
Platelets: 145 K/uL — ABNORMAL LOW (ref 150–400)
RBC: 4.99 MIL/uL (ref 4.22–5.81)
RDW: 12.6 % (ref 11.5–15.5)
WBC: 8.2 K/uL (ref 4.0–10.5)
nRBC: 0 % (ref 0.0–0.2)

## 2024-08-23 LAB — RESP PANEL BY RT-PCR (RSV, FLU A&B, COVID)  RVPGX2
Influenza A by PCR: NEGATIVE
Influenza B by PCR: NEGATIVE
Resp Syncytial Virus by PCR: NEGATIVE
SARS Coronavirus 2 by RT PCR: POSITIVE — AB

## 2024-08-23 LAB — CK: Total CK: 346 U/L (ref 49–397)

## 2024-08-23 MED ORDER — LACTATED RINGERS IV SOLN: INTRAVENOUS | Status: AC

## 2024-08-23 MED ORDER — IPRATROPIUM-ALBUTEROL 0.5-2.5 (3) MG/3ML IN SOLN
3.0000 mL | RESPIRATORY_TRACT | Status: AC
  Administered 2024-08-23: 3 mL via RESPIRATORY_TRACT
  Filled 2024-08-23: qty 3

## 2024-08-23 MED ORDER — PRAVASTATIN SODIUM 10 MG PO TABS
10.0000 mg | ORAL_TABLET | Freq: Every day | ORAL | Status: DC
  Administered 2024-08-23 – 2024-09-01 (×10): 10 mg via ORAL
  Filled 2024-08-23 (×10): qty 1

## 2024-08-23 MED ORDER — EZETIMIBE 10 MG PO TABS
10.0000 mg | ORAL_TABLET | Freq: Every day | ORAL | Status: DC
  Administered 2024-08-23 – 2024-09-02 (×11): 10 mg via ORAL
  Filled 2024-08-23 (×11): qty 1

## 2024-08-23 MED ORDER — LACTATED RINGERS IV SOLN
INTRAVENOUS | Status: DC
Start: 2024-08-23 — End: 2024-08-23

## 2024-08-23 MED ORDER — ACETAMINOPHEN 325 MG PO TABS
650.0000 mg | ORAL_TABLET | Freq: Once | ORAL | Status: AC
  Administered 2024-08-23: 650 mg via ORAL
  Filled 2024-08-23: qty 2

## 2024-08-23 MED ORDER — ACETAMINOPHEN 325 MG PO TABS
650.0000 mg | ORAL_TABLET | Freq: Four times a day (QID) | ORAL | Status: DC | PRN
  Administered 2024-08-24: 325 mg via ORAL
  Administered 2024-08-31 – 2024-09-01 (×3): 650 mg via ORAL
  Filled 2024-08-23 (×4): qty 2

## 2024-08-23 MED ORDER — PROCHLORPERAZINE EDISYLATE 10 MG/2ML IJ SOLN: 5.0000 mg | Freq: Four times a day (QID) | INTRAMUSCULAR | Status: DC | PRN

## 2024-08-23 MED ORDER — AMLODIPINE BESYLATE 10 MG PO TABS
10.0000 mg | ORAL_TABLET | Freq: Every day | ORAL | Status: DC
  Administered 2024-08-24 – 2024-09-02 (×10): 10 mg via ORAL
  Filled 2024-08-23 (×10): qty 1

## 2024-08-23 MED ORDER — SODIUM CHLORIDE 0.9 % IV BOLUS
500.0000 mL | Freq: Once | INTRAVENOUS | Status: AC
  Administered 2024-08-23: 500 mL via INTRAVENOUS

## 2024-08-23 MED ORDER — IPRATROPIUM-ALBUTEROL 0.5-2.5 (3) MG/3ML IN SOLN: 3.0000 mL | RESPIRATORY_TRACT | Status: DC | PRN

## 2024-08-23 MED ORDER — POLYETHYLENE GLYCOL 3350 17 G PO PACK
17.0000 g | PACK | Freq: Every day | ORAL | Status: DC | PRN
  Administered 2024-08-29: 17 g via ORAL
  Filled 2024-08-23: qty 1

## 2024-08-23 MED ORDER — GUAIFENESIN-DM 100-10 MG/5ML PO SYRP
5.0000 mL | ORAL_SOLUTION | ORAL | Status: DC | PRN
  Administered 2024-08-24: 5 mL via ORAL
  Filled 2024-08-23: qty 5

## 2024-08-23 MED ORDER — MELATONIN 5 MG PO TABS
5.0000 mg | ORAL_TABLET | Freq: Every evening | ORAL | Status: DC | PRN
  Administered 2024-08-24 – 2024-09-01 (×3): 5 mg via ORAL
  Filled 2024-08-23 (×3): qty 1

## 2024-08-23 MED ORDER — ENOXAPARIN SODIUM 40 MG/0.4ML IJ SOSY
40.0000 mg | PREFILLED_SYRINGE | INTRAMUSCULAR | Status: DC
  Administered 2024-08-23 – 2024-09-01 (×10): 40 mg via SUBCUTANEOUS
  Filled 2024-08-23 (×10): qty 0.4

## 2024-08-23 MED ORDER — ASPIRIN 81 MG PO TBEC
81.0000 mg | DELAYED_RELEASE_TABLET | Freq: Every day | ORAL | Status: DC
  Administered 2024-08-24 – 2024-09-02 (×10): 81 mg via ORAL
  Filled 2024-08-23 (×10): qty 1

## 2024-08-23 MED ORDER — PHENOL 1.4 % MT LIQD: 1.0000 | OROMUCOSAL | Status: DC | PRN

## 2024-08-23 NOTE — ED Provider Notes (Signed)
 [place] [facility] AT [facility] Provider Note   CSN: [id number] Arrival date & time: 08/23/24  1624     Patient presents with: Chills   Jose [name] is a 88 y.o. male who presents to the emergency department with a chief complaint of generalized fatigue as well as body pain for approximately 2 to 3 days, daughter at bedside states that patient has also run a low-grade fever of 99 degrees at home.  Patient did have a mechanical fall possibly due to weakness yesterday when attempting to go up stairs.  Patient denies head or neck injury.  Patient states that he does have a productive cough and has been coughing up phlegm.  Daughter states that patient has had reduced p.o. intake due to throat pain, is concerned about possible dehydration.  She also mentions that the patient has been experiencing some muscle cramping.  Denies chest pain, shortness of breath, abdominal pain, nausea, vomiting, urinary symptoms.  Past medical history significant for hypertension, coronary artery disease, myocardial infarction, ventricular fibrillation resulting in cardiac arrest, multiple stents, hyperlipidemia, left bundle branch block, gout, kidney stone, etc.   HPI     Prior to Admission medications   Medication Sig Start Date End Date Taking? Authorizing Provider  amLODipine  (NORVASC ) 10 MG tablet Take 10 mg by mouth daily. 11/10/22   [provider]  aspirin  EC 81 MG tablet Take 81 mg by mouth daily.    [provider]  EPINEPHrine  0.3 mg/0.3 mL IJ SOAJ injection Inject 0.3 mg into the muscle once as needed (allergic reaction). Patient not taking: Reported on 04/17/2024    [provider]  ezetimibe  (ZETIA ) 10 MG tablet Take 1 tablet (10 mg total) by mouth daily. 04/17/24   Croitoru, Mihai, MD  pravastatin  (PRAVACHOL ) 10 MG tablet Take 10 mg by mouth at bedtime.  08/06/17   [provider]    Allergies: Yellow jacket venom [bee venom], Benicar  [olmesartan], Cozaar [losartan], Plavix [clopidogrel], Relafen [nabumetone], Statins, Tekturna [aliskiren], Atorvastatin, and Ramipril    Review of Systems  Constitutional:  Positive for fever.    Updated Vital Signs BP 128/62   Pulse 83   Temp (!) 100.6 F (38.1 C) (Oral)   Resp 16   Ht 5' 11 (1.803 m)   Wt 82 kg   SpO2 94%   BMI 25.21 kg/m   Physical Exam Vitals and nursing note reviewed.  Constitutional:      General: He is awake. He is not in acute distress.    Appearance: He is ill-appearing. He is not diaphoretic.     Comments: Patient appears generally weak on exam including when sitting up in bed, patient struggled to remain holding himself up in bed for me to listen to his lungs on his back  HENT:     Head: Normocephalic and atraumatic.     Comments: No raccoon eyes, no Battle sign, no tenderness to palpation of facial bones or skull Eyes:     General: No scleral icterus.    Extraocular Movements: Extraocular movements intact.     Pupils: Pupils are equal, round, and reactive to light.  Cardiovascular:     Rate and Rhythm: Normal rate and regular rhythm.  Pulmonary:     Effort: Pulmonary effort is normal. No respiratory distress.     Breath sounds: Normal breath sounds. No wheezing, rhonchi or rales.  Abdominal:     General: Abdomen is flat. There is no distension.  Tenderness: There is no abdominal tenderness. There is no right CVA tenderness, left CVA tenderness, guarding or rebound.  Musculoskeletal:        General: Normal range of motion.     Cervical back: Normal range of motion. No tenderness.     Right lower leg: No edema.     Left lower leg: No edema.     Comments: Generalized weakness on exam including when patient was sitting up in bed  Skin:    General: Skin is warm.     Capillary Refill: Capillary refill takes less than 2 seconds.  Neurological:     General: No focal deficit present.     Mental Status: He is alert and oriented to person,  place, and time.  Psychiatric:        Mood and Affect: Mood normal.        Behavior: Behavior normal. Behavior is cooperative.     (all labs ordered are listed, but only abnormal results are displayed) Labs Reviewed  RESP PANEL BY RT-PCR (RSV, FLU A&B, COVID)  RVPGX2 - Abnormal; Notable for the following components:      Result Value   SARS Coronavirus 2 by RT PCR POSITIVE (*)    All other components within normal limits  CBC WITH DIFFERENTIAL/PLATELET - Abnormal; Notable for the following components:   Platelets 145 (*)    Lymphs Abs 0.5 (*)    All other components within normal limits  COMPREHENSIVE METABOLIC PANEL WITH GFR - Abnormal; Notable for the following components:   Glucose, Bld 129 (*)    Creatinine, Ser 1.27 (*)    Calcium 8.7 (*)    GFR, Estimated 55 (*)    All other components within normal limits  CK  CBC  CREATININE, SERUM    EKG: None  Radiology: CT Head Wo Contrast Result Date: 08/23/2024 EXAM: CT HEAD AND CERVICAL SPINE 08/23/2024 09:01:00 PM TECHNIQUE: CT of the head and cervical spine was performed without the administration of intravenous contrast. Multiplanar reformatted images are provided for review. Automated exposure control, iterative reconstruction, and/or weight based adjustment of the mA/kV was utilized to reduce the radiation dose to as low as reasonably achievable. COMPARISON: 09/13/2021 CLINICAL HISTORY: Fall, possibly on a blood thinner. S/p fall. C/o neck pain. 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 Confluent chronic ischemic white matter changes. Mild volume loss. No acute intracranial hemorrhage. No mass effect or midline shift. No abnormal extra-axial fluid collection. No hydrocephalus. ORBITS: No acute abnormality. SINUSES AND MASTOIDS: No acute abnormality. SOFT TISSUES AND SKULL: No acute skull fracture. No acute soft tissue abnormality. CT CERVICAL  SPINE BONES AND ALIGNMENT: No acute fracture or traumatic malalignment. DEGENERATIVE CHANGES: No significant degenerative changes. SOFT TISSUES: No prevertebral soft tissue swelling. IMPRESSION: 1. No acute intracranial abnormality. 2. 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 3. No acute fracture or traumatic malalignment of the cervical spine. Electronically signed by: Franky Stanford MD 08/23/2024 09:12 PM EDT RP Workstation: [id number]   CT Cervical Spine Wo Contrast Result Date: 08/23/2024 EXAM: CT HEAD AND CERVICAL SPINE 08/23/2024 09:01:00 PM TECHNIQUE: CT of the head and cervical spine was performed without the administration of intravenous contrast. Multiplanar reformatted images are provided for review. Automated exposure control, iterative reconstruction, and/or weight based adjustment of the mA/kV was utilized to reduce the radiation dose to as low as reasonably achievable. COMPARISON: 09/13/2021 CLINICAL HISTORY: Fall, possibly on a blood thinner. S/p  fall. C/o neck pain. 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 Confluent chronic ischemic white matter changes. Mild volume loss. No acute intracranial hemorrhage. No mass effect or midline shift. No abnormal extra-axial fluid collection. No hydrocephalus. ORBITS: No acute abnormality. SINUSES AND MASTOIDS: No acute abnormality. SOFT TISSUES AND SKULL: No acute skull fracture. No acute soft tissue abnormality. CT CERVICAL SPINE BONES AND ALIGNMENT: No acute fracture or traumatic malalignment. DEGENERATIVE CHANGES: No significant degenerative changes. SOFT TISSUES: No prevertebral soft tissue swelling. IMPRESSION: 1. No acute intracranial abnormality. 2. 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 3. No acute fracture or traumatic malalignment of the cervical spine. Electronically signed by: Franky Stanford MD 08/23/2024 09:12 PM EDT RP Workstation:  [id number]   DG Chest 2 View Result Date: 08/23/2024 CLINICAL DATA:  Shortness of breath with cough EXAM: CHEST - 2 VIEW COMPARISON:  Chest x-ray 01/07/2017 FINDINGS: Patient is status post cardiac surgery. The heart size and mediastinal contours are within normal limits. Both lungs are clear. The visualized skeletal structures are unremarkable. IMPRESSION: No active cardiopulmonary disease. Electronically Signed   By: Greig Pique M.D.   On: 08/23/2024 18:19     Procedures   Medications Ordered in the ED  enoxaparin  (LOVENOX ) injection 40 mg (has no administration in time range)  acetaminophen  (TYLENOL ) tablet 650 mg (has no administration in time range)  prochlorperazine  (COMPAZINE ) injection 5 mg (has no administration in time range)  polyethylene glycol (MIRALAX  / GLYCOLAX ) packet 17 g (has no administration in time range)  melatonin tablet 5 mg (has no administration in time range)  lactated ringers  infusion (has no administration in time range)  ezetimibe  (ZETIA ) tablet 10 mg (has no administration in time range)  ipratropium-albuterol  (DUONEB) 0.5-2.5 (3) MG/3ML nebulizer solution 3 mL (has no administration in time range)  guaiFENesin -dextromethorphan  (ROBITUSSIN DM) 100-10 MG/5ML syrup 5 mL (has no administration in time range)  sodium chloride  0.9 % bolus 500 mL (0 mLs Intravenous Stopped 08/23/24 2102)  acetaminophen  (TYLENOL ) tablet 650 mg (650 mg Oral Given 08/23/24 2004)                                    Medical Decision Making Amount and/or Complexity of Data Reviewed Labs: ordered. Radiology: ordered.  Risk OTC drugs. Decision regarding hospitalization.   Patient presents to the ED for concern of mechanical fall, generalized fatigue, low-grade fever, shaking, nasal congestion, this involves an extensive number of treatment options, and is a complaint that carries with it a high risk of complications and morbidity.  The differential diagnosis includes sepsis,  pneumonia, COVID, flu, RSV, viral syndrome, sinusitis, etc.   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  The pertinent results include:  CBC unremarkable, CK3 46, CMP shows creatinine of 1.27 which is slightly elevated from patient baseline, respiratory panel positive for COVID   Imaging Studies ordered:  I ordered imaging studies including CT head, CT cervical spine, chest x-ray I independently visualized and interpreted imaging which showed: Chest x-ray: No evidence of pneumonia CT cervical spine/CT head: No acute abnormality related to fall, chronic ischemic changes and multiple old infarcts I agree with the radiologist interpretation   Cardiac Monitoring:  The patient was maintained on a cardiac monitor.  I personally viewed and interpreted the cardiac monitored which showed an underlying rhythm of: Sinus rhythm   Medicines ordered and prescription drug management:  I ordered medication including Tylenol  for pain/fever, fluids for possible dehydration Reevaluation of the patient after these medicines showed that the patient stayed the same I have reviewed the patients home medicines and have made adjustments as  needed   Test Considered:  None   Critical Interventions:  None   Consultations Obtained:  I requested consultation with the hospitalist team,  and discussed lab and imaging findings as well as pertinent plan - they recommend: Admission for ongoing diagnosis and treatment   Problem List / ED Course:  88 year old male, generalized fatigue and malaise for approximately 2 to 3 days along with low-grade fever, chills, nasal congestion, productive cough, vital signs stable upon coming into the emergency department Initially history obtained from patient, patient denies chest pain or shortness of breath, states that he overall just does not feel well and has also been experiencing some muscle cramps at times, patient states that he has been drinking a lot of water recently On physical exam patient does appear generally weak and has issues sitting up in bed without assistance, no obvious abnormality with auscultation of heart or lungs Will obtain general lab work including respiratory panel, Tylenol  ordered for generalized malaise and fatigue Labs significant for elevated creatinine at 1.27, small bolus of fluids ordered for elevated creatinine On reassessment daughter at bedside states that patient's p.o. intake has not been very good and he is extremely weak compared to his baseline and that this may be why he had the mechanical fall yesterday, daughter and patient stated that patient is not on a blood thinner however this is documented in EMS report to nursing Due to new onset weakness and extensive discussion with daughter, will obtain CK level, CT imaging of head and cervical spine and plan for admission as patient has now developed a fever at 100.6 and is continued to be weak, daughter concerned about home life as patient lives alone with elderly wife and he is the main caregiver Imaging of head and neck reassuring Spoke with Dr. Shona with the hospitalist team who agrees with  admission Most likely diagnosis at time of admission is COVID-19 resulting in weakness, possible mechanical fall without obvious injuries from yesterday, as well as slightly elevated creatinine most likely in the setting of reduced p.o. intake, patient does have significant comorbidities   Reevaluation:  After the interventions noted above, I reevaluated the patient and found that they have :stayed the same   Social Determinants of Health:  none   Dispostion:  After consideration of the diagnostic results and the patients response to treatment, I feel that the patent would benefit from admission to the hospital for ongoing diagnosis and treatment.       Final diagnoses:  COVID-19  Elevated  serum creatinine  Fatigue, unspecified type  Fall, initial encounter    ED Discharge Orders     None          Janetta Terrall FALCON, PA-C 08/23/24 2145    Pamella Ozell LABOR, DO 08/24/24 1045

## 2024-08-23 NOTE — ED Notes (Signed)
 Pt pocketing pills in mouth, pt then began coughing after drinking water

## 2024-08-23 NOTE — ED Notes (Signed)
 Pt began coughing after swallowing meds

## 2024-08-23 NOTE — ED Triage Notes (Signed)
 Pt to ED via EMS from home with c/o low-grade fever, shaking, and nasal congestion starting this am. Pt fell yesterday onto hardwood fall. Pt denies hitting head/LOC. +blood thinners. Pt c/o generalized pain that started yesterday. Pt A&Ox4.

## 2024-08-23 NOTE — ED Notes (Signed)
 Pt pocketing pills and then choked on water, MD notified to order speech evaluation

## 2024-08-23 NOTE — H&P (Addendum)
 History and Physical  Jose [name] FMW:[id number] DOB: 08-16-1936 DOA: 08/23/2024  Referring physician: Janetta Derry, PA-EDP  PCP: Royden [name], FNP  Outpatient Specialists: None Patient coming from: Home  Chief Complaint: Generalized weakness with a fall   HPI: Jose [name] is a 88 y.o. male with medical history significant for coronary artery disease status post CABG, hyperlipidemia, hypertension, chronic combined diastolic and systolic CHF (2D echo 2022), presents to the ER due to generalized weakness for the past 2 to 3 days.  Associated with subjective low-grade fevers at home.  The patient had a fall yesterday when going up the steps.  Endorses chills, congestion, productive cough, and generalized malaise.  Due to persistent symptomatology, the patient was brought into the ER by family members.  At baseline, the patient is independent of all his activities of daily living and takes care of his wife.  Has had a decrease in oral intake which he attributes to feeling poorly.  In the ER, febrile with Tmax 100.6.  Lab studies notable for glucose 129, creatinine 1.27, GFR 55 from normal baseline.  COVID-19 PCR positive.  Chest x-ray nonacute.  ED Course: Temperature 100.6.  BP 128/62, pulse 83, respiratory rate 16, O2 saturation 94% on room air.  Review of Systems: Review of systems as noted in the HPI. All other systems reviewed and are negative.   Past Medical History:  Diagnosis Date   Complication of anesthesia    hard time waking up from it alot of times (10/09/2013)   Coronary artery disease    hx CABG 2011   GERD (gastroesophageal reflux disease)    not in >30 yr (10/09/2013)   Gout    H/O cardiovascular stress test 2011     mild to mod ischemia this led to cath and CABG   Hx of echocardiogram 2010   EF >55%, mild to mod MR, mild aortic reg.    Hyperlipidemia LDL goal <70    Hypertension    Kidney stone    Myocardial infarction [facility]) 1999; 2006   both acute  inferior wall   Skin cancer    right nose, lower lip, right ankle, left side (10/09/2013)   Ventricular fibrillation (HCC) 2006   arrest with MI   Past Surgical History:  Procedure Laterality Date   BACK SURGERY     CORONARY ANGIOPLASTY WITH STENT PLACEMENT  1999   Stent to RCA   CORONARY ANGIOPLASTY WITH STENT PLACEMENT  2006   stent to distal RCA   CORONARY ANGIOPLASTY WITH STENT PLACEMENT  2006   mid LAD with Cypher stent   CORONARY ARTERY BYPASS GRAFT  2011   LIMA-LAD; seq VG-1st & 2nd OM of LCX; VG-PDA of RCA   CYSTOSCOPY     had alot of laser for kidney stones (10/09/2013)   CYSTOSCOPY W/ STONE MANIPULATION  12/2003   I & D EXTREMITY  06/18/2012   Procedure: IRRIGATION AND DEBRIDEMENT EXTREMITY;  Surgeon: Prentice LELON Pagan, MD;  Location: [facility] OR;  Service: Orthopedics;  Laterality: Left;  I & D left thumb    LITHOTRIPSY     several times (10/09/2013)   LUMBAR DISC SURGERY  1998   disc ruptured (10/09/2013)   SKIN CANCER EXCISION     right nose, lower lip; right ankle, left side (10/09/2013)    Social History:  reports that he has never smoked. He has never used smokeless tobacco. He reports current alcohol use. He reports that he does not use drugs.  Allergies  Allergen Reactions   Yellow Jacket Venom [Bee Venom] Hives, Shortness Of Breath and Rash   Benicar [Olmesartan]     Enlarged breasts   Cozaar [Losartan] Other (See Comments)    Reaction unknown   Plavix [Clopidogrel] Other (See Comments)    Reaction unknown   Relafen [Nabumetone] Other (See Comments)    Unknown    Statins Other (See Comments)    unknown   Tekturna [Aliskiren] Other (See Comments)    Reaction unknown    Atorvastatin Other (See Comments)   Ramipril Cough    Family History  Problem Relation Age of Onset   Diabetes Mother    Heart attack Father    Colon cancer Sister    Cancer Brother       Prior to Admission medications   Medication Sig Start Date End Date Taking? Authorizing  Provider  amLODipine  (NORVASC ) 10 MG tablet Take 10 mg by mouth daily. 11/10/22   [provider]  aspirin  EC 81 MG tablet Take 81 mg by mouth daily.    [provider]  EPINEPHrine  0.3 mg/0.3 mL IJ SOAJ injection Inject 0.3 mg into the muscle once as needed (allergic reaction). Patient not taking: Reported on 04/17/2024    [provider]  ezetimibe  (ZETIA ) 10 MG tablet Take 1 tablet (10 mg total) by mouth daily. 04/17/24   Croitoru, Mihai, MD  pravastatin  (PRAVACHOL ) 10 MG tablet Take 10 mg by mouth at bedtime.  08/06/17   [provider]    Physical Exam: BP 128/62   Pulse 83   Temp (!) 100.6 F (38.1 C) (Oral)   Resp 16   Ht 5' 11 (1.803 m)   Wt 82 kg   SpO2 94%   BMI 25.21 kg/m   General: 88 y.o. year-old male well developed well nourished in no acute distress.  Alert and oriented x3. Cardiovascular: Regular rate and rhythm with no rubs or gallops.  No thyromegaly or JVD noted.  No lower extremity edema. 2/4 pulses in all 4 extremities. Respiratory: Faint wheezes. Good inspiratory effort. Abdomen: Soft nontender nondistended with normal bowel sounds x4 quadrants. Muskuloskeletal: No cyanosis, clubbing or edema noted bilaterally Neuro: CN II-XII intact, strength, sensation, reflexes Skin: No ulcerative lesions noted or rashes Psychiatry: Judgement and insight appear normal. Mood is appropriate for condition and setting          Labs on Admission:  Basic Metabolic Panel: Recent Labs  Lab 08/23/24 1715  NA 137  K 4.1  CL 100  CO2 24  GLUCOSE 129*  BUN 19  CREATININE 1.27*  CALCIUM 8.7*   Liver Function Tests: Recent Labs  Lab 08/23/24 1715  AST 37  ALT 17  ALKPHOS 47  BILITOT 0.9  PROT 7.1  ALBUMIN 3.6   No results for input(s): LIPASE, AMYLASE in the last 168 hours. No results for input(s): AMMONIA in the last 168 hours. CBC: Recent Labs  Lab 08/23/24 1715  WBC 8.2  NEUTROABS 7.1  HGB 15.6  HCT 47.2  MCV 94.6   PLT 145*   Cardiac Enzymes: Recent Labs  Lab 08/23/24 2000  CKTOTAL 346    BNP (last 3 results) No results for input(s): BNP in the last 8760 hours.  ProBNP (last 3 results) No results for input(s): PROBNP in the last 8760 hours.  CBG: No results for input(s): GLUCAP in the last 168 hours.  Radiological Exams on Admission: CT Head Wo Contrast Result Date: 08/23/2024 EXAM: CT HEAD AND CERVICAL SPINE  08/23/2024 09:01:00 PM TECHNIQUE: CT of the head and cervical spine was performed without the administration of intravenous contrast. Multiplanar reformatted images are provided for review. Automated exposure control, iterative reconstruction, and/or weight based adjustment of the mA/kV was utilized to reduce the radiation dose to as low as reasonably achievable. COMPARISON: 09/13/2021 CLINICAL HISTORY: Fall, possibly on a blood thinner. S/p fall. C/o neck pain. 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 Confluent chronic ischemic white matter changes. Mild volume loss. No acute intracranial hemorrhage. No mass effect or midline shift. No abnormal extra-axial fluid collection. No hydrocephalus. ORBITS: No acute abnormality. SINUSES AND MASTOIDS: No acute abnormality. SOFT TISSUES AND SKULL: No acute skull fracture. No acute soft tissue abnormality. CT CERVICAL SPINE BONES AND ALIGNMENT: No acute fracture or traumatic malalignment. DEGENERATIVE CHANGES: No significant degenerative changes. SOFT TISSUES: No prevertebral soft tissue swelling. IMPRESSION: 1. No acute intracranial abnormality. 2. 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 3. No acute fracture or traumatic malalignment of the cervical spine. Electronically signed by: Franky Stanford MD 08/23/2024 09:12 PM EDT RP Workstation: [id number]   CT Cervical Spine Wo Contrast Result Date: 08/23/2024 EXAM: CT HEAD AND CERVICAL SPINE 08/23/2024  09:01:00 PM TECHNIQUE: CT of the head and cervical spine was performed without the administration of intravenous contrast. Multiplanar reformatted images are provided for review. Automated exposure control, iterative reconstruction, and/or weight based adjustment of the mA/kV was utilized to reduce the radiation dose to as low as reasonably achievable. COMPARISON: 09/13/2021 CLINICAL HISTORY: Fall, possibly on a blood thinner. S/p fall. C/o neck pain. 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 Confluent chronic ischemic white matter changes. Mild volume loss. No acute intracranial hemorrhage. No mass effect or midline shift. No abnormal extra-axial fluid collection. No hydrocephalus. ORBITS: No acute abnormality. SINUSES AND MASTOIDS: No acute abnormality. SOFT TISSUES AND SKULL: No acute skull fracture. No acute soft tissue abnormality. CT CERVICAL SPINE BONES AND ALIGNMENT: No acute fracture or traumatic malalignment. DEGENERATIVE CHANGES: No significant degenerative changes. SOFT TISSUES: No prevertebral soft tissue swelling. IMPRESSION: 1. No acute intracranial abnormality. 2. 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 3. No acute fracture or traumatic malalignment of the cervical spine. Electronically signed by: Franky Stanford MD 08/23/2024 09:12 PM EDT RP Workstation: [id number]   DG Chest 2 View Result Date: 08/23/2024 CLINICAL DATA:  Shortness of breath with cough EXAM: CHEST - 2 VIEW COMPARISON:  Chest x-ray 01/07/2017 FINDINGS: Patient is status post cardiac surgery. The heart size and mediastinal contours are within normal limits. Both lungs are clear. The visualized skeletal structures are unremarkable. IMPRESSION: No active cardiopulmonary disease. Electronically Signed   By: Greig Pique M.D.   On: 08/23/2024 18:19    EKG: I independently viewed the EKG done and my findings are as followed: None  available at the time of this visit.  Assessment/Plan Present on Admission:  COVID-19 virus infection  Principal Problem:   COVID-19 virus infection  COVID-19 viral infection COVID-19 PCR positive on 08/23/2024 Not hypoxic, O2 saturation 96% on room air, chest x-ray nonacute. Febrile, Tmax 100.6. Symptoms started less than 5 days ago Patient declined IV remdesivir-he is alert and oriented x 4. Follow ESR and CRP As needed antitussives As needed bronchodilators nebulizers. Incentive spirometer Early mobilization  Poor oral intake, likely secondary to COVID-19 viral infection. Gentle IV fluid hydration, LR at 50 cc/h x  12 hours. Encourage oral intake As needed antiemetics  Chronic combined diastolic and systolic CHF Coronary artery disease status post CABG Last 2D echo done on 10/18/2021 revealed LVEF 45 to 50%, LV regional wall motion abnormalities, and grade 1 diastolic dysfunction Euvolemic on exam Resume home regimen once home meds are reconciled. Monitor strict I's and O's and daily weight  Hyperlipidemia Resume home regimen  Generalized weakness in the setting of COVID-19 viral infection PT OT assessment Fall precautions.   Time: 75 minutes.   DVT prophylaxis: Subcu Lovenox  daily.  Code Status: Full code.  Family Communication: None at bedside.  Disposition Plan: Admitted to telemetry medical unit.  Consults called: None.  Admission status: Observation status.   Status is: Observation    Terry LOISE Hurst MD [facility] Pager [phone]  If 7PM-7AM, please contact night-coverage [email or web address] Password [email or web address]  08/23/2024, 9:16 PM

## 2024-08-23 NOTE — Progress Notes (Signed)
 Report received from the patient's bedside RN due to trouble swallowing:  This pt needs to be NPO and needs a swallow eval, every time I have given him a pill tonight he has got choked on it and he is going to aspirate.  Speech therapist consulted for an official swallow evaluation--- NPO until passes a swallow evaluation.  Aspirations precautions are in place.  We will continue to closely monitor and treat as indicated.  No charge note.

## 2024-08-24 DIAGNOSIS — J1282 Pneumonia due to coronavirus disease 2019: Secondary | ICD-10-CM | POA: Diagnosis present

## 2024-08-24 DIAGNOSIS — J9601 Acute respiratory failure with hypoxia: Secondary | ICD-10-CM | POA: Diagnosis present

## 2024-08-24 DIAGNOSIS — I2510 Atherosclerotic heart disease of native coronary artery without angina pectoris: Secondary | ICD-10-CM | POA: Diagnosis present

## 2024-08-24 DIAGNOSIS — J159 Unspecified bacterial pneumonia: Secondary | ICD-10-CM | POA: Diagnosis present

## 2024-08-24 DIAGNOSIS — R7989 Other specified abnormal findings of blood chemistry: Secondary | ICD-10-CM

## 2024-08-24 DIAGNOSIS — Z833 Family history of diabetes mellitus: Secondary | ICD-10-CM | POA: Diagnosis not present

## 2024-08-24 DIAGNOSIS — R609 Edema, unspecified: Secondary | ICD-10-CM | POA: Diagnosis not present

## 2024-08-24 DIAGNOSIS — I5042 Chronic combined systolic (congestive) and diastolic (congestive) heart failure: Secondary | ICD-10-CM | POA: Diagnosis present

## 2024-08-24 DIAGNOSIS — Z7982 Long term (current) use of aspirin: Secondary | ICD-10-CM | POA: Diagnosis not present

## 2024-08-24 DIAGNOSIS — Z8249 Family history of ischemic heart disease and other diseases of the circulatory system: Secondary | ICD-10-CM | POA: Diagnosis not present

## 2024-08-24 DIAGNOSIS — J9690 Respiratory failure, unspecified, unspecified whether with hypoxia or hypercapnia: Secondary | ICD-10-CM | POA: Diagnosis not present

## 2024-08-24 DIAGNOSIS — R5383 Other fatigue: Secondary | ICD-10-CM

## 2024-08-24 DIAGNOSIS — Y92008 Other place in unspecified non-institutional (private) residence as the place of occurrence of the external cause: Secondary | ICD-10-CM | POA: Diagnosis not present

## 2024-08-24 DIAGNOSIS — R1312 Dysphagia, oropharyngeal phase: Secondary | ICD-10-CM | POA: Diagnosis not present

## 2024-08-24 DIAGNOSIS — Z743 Need for continuous supervision: Secondary | ICD-10-CM | POA: Diagnosis not present

## 2024-08-24 DIAGNOSIS — Z7401 Bed confinement status: Secondary | ICD-10-CM | POA: Diagnosis not present

## 2024-08-24 DIAGNOSIS — Z79899 Other long term (current) drug therapy: Secondary | ICD-10-CM | POA: Diagnosis not present

## 2024-08-24 DIAGNOSIS — E876 Hypokalemia: Secondary | ICD-10-CM | POA: Diagnosis present

## 2024-08-24 DIAGNOSIS — Z888 Allergy status to other drugs, medicaments and biological substances status: Secondary | ICD-10-CM | POA: Diagnosis not present

## 2024-08-24 DIAGNOSIS — R0603 Acute respiratory distress: Secondary | ICD-10-CM | POA: Diagnosis not present

## 2024-08-24 DIAGNOSIS — Z85828 Personal history of other malignant neoplasm of skin: Secondary | ICD-10-CM | POA: Diagnosis not present

## 2024-08-24 DIAGNOSIS — K219 Gastro-esophageal reflux disease without esophagitis: Secondary | ICD-10-CM | POA: Diagnosis present

## 2024-08-24 DIAGNOSIS — Z951 Presence of aortocoronary bypass graft: Secondary | ICD-10-CM | POA: Diagnosis not present

## 2024-08-24 DIAGNOSIS — N1831 Chronic kidney disease, stage 3a: Secondary | ICD-10-CM | POA: Diagnosis present

## 2024-08-24 DIAGNOSIS — E785 Hyperlipidemia, unspecified: Secondary | ICD-10-CM | POA: Diagnosis present

## 2024-08-24 DIAGNOSIS — I130 Hypertensive heart and chronic kidney disease with heart failure and stage 1 through stage 4 chronic kidney disease, or unspecified chronic kidney disease: Secondary | ICD-10-CM | POA: Diagnosis present

## 2024-08-24 DIAGNOSIS — W19XXXS Unspecified fall, sequela: Secondary | ICD-10-CM | POA: Diagnosis not present

## 2024-08-24 DIAGNOSIS — Z886 Allergy status to analgesic agent status: Secondary | ICD-10-CM | POA: Diagnosis not present

## 2024-08-24 DIAGNOSIS — Z800 Family history of malignant neoplasm of digestive organs: Secondary | ICD-10-CM | POA: Diagnosis not present

## 2024-08-24 DIAGNOSIS — W19XXXA Unspecified fall, initial encounter: Secondary | ICD-10-CM | POA: Diagnosis not present

## 2024-08-24 DIAGNOSIS — R627 Adult failure to thrive: Secondary | ICD-10-CM | POA: Diagnosis present

## 2024-08-24 DIAGNOSIS — Z91030 Bee allergy status: Secondary | ICD-10-CM | POA: Diagnosis not present

## 2024-08-24 DIAGNOSIS — R918 Other nonspecific abnormal finding of lung field: Secondary | ICD-10-CM | POA: Diagnosis not present

## 2024-08-24 DIAGNOSIS — Z955 Presence of coronary angioplasty implant and graft: Secondary | ICD-10-CM | POA: Diagnosis not present

## 2024-08-24 DIAGNOSIS — I771 Stricture of artery: Secondary | ICD-10-CM | POA: Diagnosis not present

## 2024-08-24 DIAGNOSIS — U071 COVID-19: Secondary | ICD-10-CM | POA: Diagnosis present

## 2024-08-24 DIAGNOSIS — R0602 Shortness of breath: Secondary | ICD-10-CM | POA: Diagnosis not present

## 2024-08-24 DIAGNOSIS — W109XXA Fall (on) (from) unspecified stairs and steps, initial encounter: Secondary | ICD-10-CM | POA: Diagnosis present

## 2024-08-24 LAB — BASIC METABOLIC PANEL WITH GFR
Anion gap: 13 (ref 5–15)
BUN: 21 mg/dL (ref 8–23)
CO2: 22 mmol/L (ref 22–32)
Calcium: 8.5 mg/dL — ABNORMAL LOW (ref 8.9–10.3)
Chloride: 102 mmol/L (ref 98–111)
Creatinine, Ser: 1.33 mg/dL — ABNORMAL HIGH (ref 0.61–1.24)
GFR, Estimated: 52 mL/min — ABNORMAL LOW (ref >60)
Glucose, Bld: 134 mg/dL — ABNORMAL HIGH (ref 70–99)
Potassium: 4.1 mmol/L (ref 3.5–5.1)
Sodium: 137 mmol/L (ref 135–145)

## 2024-08-24 LAB — CBC
HCT: 46.7 % (ref 39.0–52.0)
Hemoglobin: 15.4 g/dL (ref 13.0–17.0)
MCH: 31.2 pg (ref 26.0–34.0)
MCHC: 33 g/dL (ref 30.0–36.0)
MCV: 94.7 fL (ref 80.0–100.0)
Platelets: 146 K/uL — ABNORMAL LOW (ref 150–400)
RBC: 4.93 MIL/uL (ref 4.22–5.81)
RDW: 12.6 % (ref 11.5–15.5)
WBC: 12.7 K/uL — ABNORMAL HIGH (ref 4.0–10.5)
nRBC: 0 % (ref 0.0–0.2)

## 2024-08-24 LAB — MAGNESIUM: Magnesium: 1.8 mg/dL (ref 1.7–2.4)

## 2024-08-24 LAB — SEDIMENTATION RATE: Sed Rate: 18 mm/h — ABNORMAL HIGH (ref 0–16)

## 2024-08-24 LAB — PHOSPHORUS: Phosphorus: 3.8 mg/dL (ref 2.5–4.6)

## 2024-08-24 LAB — C-REACTIVE PROTEIN: CRP: 21.2 mg/dL — ABNORMAL HIGH (ref <1.0)

## 2024-08-24 MED ORDER — BOOST / RESOURCE BREEZE PO LIQD CUSTOM
1.0000 | Freq: Three times a day (TID) | ORAL | Status: DC
  Administered 2024-08-25 – 2024-09-02 (×20): 1 via ORAL

## 2024-08-24 MED ORDER — LACTATED RINGERS IV SOLN: INTRAVENOUS | Status: AC

## 2024-08-24 MED ORDER — ACETAMINOPHEN 650 MG RE SUPP: 650.0000 mg | RECTAL | Status: DC | PRN

## 2024-08-24 NOTE — ED Notes (Signed)
  at bedside

## 2024-08-24 NOTE — Evaluation (Signed)
 Clinical/Bedside Swallow Evaluation Patient Details  Name: Jose [name] MRN: [id number] Date of Birth: [birth year]  Today's Date: 08/24/2024 Time: SLP Start Time (ACUTE ONLY): 1157 SLP Stop Time (ACUTE ONLY): 1216 SLP Time Calculation (min) (ACUTE ONLY): 19 min  Past Medical History:  Past Medical History:  Diagnosis Date   Complication of anesthesia    hard time waking up from it alot of times (10/09/2013)   Coronary artery disease    hx CABG 2011   GERD (gastroesophageal reflux disease)    not in >30 yr (10/09/2013)   Gout    H/O cardiovascular stress test 2011     mild to mod ischemia this led to cath and CABG   Hx of echocardiogram 2010   EF >55%, mild to mod MR, mild aortic reg.    Hyperlipidemia LDL goal <70    Hypertension    Kidney stone    Myocardial infarction [facility]) 1999; 2006   both acute inferior wall   Skin cancer    right nose, lower lip, right ankle, left side (10/09/2013)   Ventricular fibrillation (HCC) 2006   arrest with MI   Past Surgical History:  Past Surgical History:  Procedure Laterality Date   BACK SURGERY     CORONARY ANGIOPLASTY WITH STENT PLACEMENT  1999   Stent to RCA   CORONARY ANGIOPLASTY WITH STENT PLACEMENT  2006   stent to distal RCA   CORONARY ANGIOPLASTY WITH STENT PLACEMENT  2006   mid LAD with Cypher stent   CORONARY ARTERY BYPASS GRAFT  2011   LIMA-LAD; seq VG-1st & 2nd OM of LCX; VG-PDA of RCA   CYSTOSCOPY     had alot of laser for kidney stones (10/09/2013)   CYSTOSCOPY W/ STONE MANIPULATION  12/2003   I & D EXTREMITY  06/18/2012   Procedure: IRRIGATION AND DEBRIDEMENT EXTREMITY;  Surgeon: Prentice LELON Pagan, MD;  Location: [facility] OR;  Service: Orthopedics;  Laterality: Left;  I & D left thumb    LITHOTRIPSY     several times (10/09/2013)   LUMBAR DISC SURGERY  1998   disc ruptured (10/09/2013)   SKIN CANCER EXCISION     right nose, lower lip; right ankle, left side (10/09/2013)   HPI:  Jose [name] is a 88 y.o. male  with medical history significant for coronary artery disease status post CABG, hyperlipidemia, hypertension, chronic combined diastolic and systolic CHF (2D echo 2022), presents to the ER due to generalized weakness for the past 2 to 3 days.  Associated with subjective low-grade fevers at home.  The patient had a fall yesterday when going up the steps.  Endorses chills, congestion, productive cough, and generalized malaise.  Due to persistent symptomatology, the patient was brought into the ER by family members.  At baseline, the patient is independent of all his activities of daily living and takes care of his wife.  Has had a decrease in oral intake which he attributes to feeling poorly. ST consulted for swallow evaluation; pt currently NPO.    Assessment / Plan / Recommendation  Clinical Impression  Pt seen for clinical swallow evaluation with recent dx of Covid+ and denies any dysphagia prior to this diagnosis.  Pt denotes hx of GERD/CVA, but no residual swallow deficits.  No family available to determine prior level of functioning in re: to swallowing.  Pt consumed thin via cup/straw, puree and soft solids with regurgitation with solid consistency and belching noted during po consumption.  Delayed cough observed consistently with solids/thin  via larger volume and puree intermittently.  Small sips of thin did not elicit this response.  Pt noted to be coughing prior to po intake and able to orally expel secretions/phlegm d/t strong cough response.  Vocal quality noted to be congested/min hoarse during conversation.  Recommend initiating a conservative diet of clear liquids with general swallow precautions in place.  ST will f/u for diet progression/education and dysphagia tx during acute stay.  Thank you for this consult. SLP Visit Diagnosis: Dysphagia, unspecified (R13.10)    Aspiration Risk  Mild aspiration risk    Diet Recommendation   Thin; clear liquids  Medication Administration: Whole meds with  liquid (or with puree prn)    Other  Recommendations Oral Care Recommendations: Oral care BID;Staff/trained caregiver to provide oral care     Assistance Recommended at Discharge  TBD  Functional Status Assessment Patient has had a recent decline in their functional status and demonstrates the ability to make significant improvements in function in a reasonable and predictable amount of time.  Frequency and Duration min 2x/week  1 week       Prognosis Prognosis for improved oropharyngeal function: Good Barriers to Reach Goals: Cognitive deficits      Swallow Study   General Date of Onset: 08/23/24 HPI: Jose [name] is a 88 y.o. male with medical history significant for coronary artery disease status post CABG, hyperlipidemia, hypertension, chronic combined diastolic and systolic CHF (2D echo 2022), presents to the ER due to generalized weakness for the past 2 to 3 days.  Associated with subjective low-grade fevers at home.  The patient had a fall yesterday when going up the steps.  Endorses chills, congestion, productive cough, and generalized malaise.  Due to persistent symptomatology, the patient was brought into the ER by family members.  At baseline, the patient is independent of all his activities of daily living and takes care of his wife.  Has had a decrease in oral intake which he attributes to feeling poorly. ST consulted for swallow evaluation; pt currently NPO. Type of Study: Bedside Swallow Evaluation Previous Swallow Assessment: n/a Diet Prior to this Study: NPO Temperature Spikes Noted: No Respiratory Status: Room air History of Recent Intubation: No Behavior/Cognition: Alert;Cooperative;Requires cueing Oral Cavity Assessment: Dry Oral Care Completed by SLP: Yes Oral Cavity - Dentition: Adequate natural dentition;Missing dentition Vision: Functional for self-feeding Self-Feeding Abilities: Able to feed self;Needs assist Patient Positioning: Upright in bed Baseline  Vocal Quality: Low vocal intensity;Hoarse Volitional Cough: Strong Volitional Swallow: Able to elicit    Oral/Motor/Sensory Function Overall Oral Motor/Sensory Function: Generalized oral weakness   Ice Chips Ice chips: Within functional limits Presentation: Spoon   Thin Liquid Thin Liquid: Impaired Presentation: Cup;Straw Pharyngeal  Phase Impairments: Cough - Delayed    Nectar Thick Nectar Thick Liquid: Not tested   Honey Thick Honey Thick Liquid: Not tested   Puree Puree: Impaired Presentation: Spoon Pharyngeal Phase Impairments: Cough - Delayed   Solid     Solid: Impaired Presentation: Spoon Pharyngeal Phase Impairments: Cough - Delayed;Other (comments) Other Comments: regurgitation      Pat Jaxie Racanelli,M.S.,CCC-SLP 08/24/2024,12:38 PM

## 2024-08-24 NOTE — ED Notes (Addendum)
 Waiting for pharmacy to verify rectal tylenol . Pharmacy notified.

## 2024-08-24 NOTE — ED Notes (Signed)
 OT at bedside.

## 2024-08-24 NOTE — Evaluation (Signed)
 Occupational Therapy Evaluation Patient Details Name: Jose [name] MRN: [id number] DOB: May 15, 1936 Today's Date: 08/24/2024   History of Present Illness   88 y.o. male presents to [facility] hospital on 08/23/2024 with generalized weakness, found to be positive for COVID. PMH includes CABG, HLD, HTN, CHF.     Clinical Impressions Prior to this admission, patient living in a multi story home, and stating he is independent in his mobility and ADLs. However, per RN, patient and his wife are fairly impaired with daughter checking on them daily. Patient does endorse falls, but unable to state how many. Patient also unable to provide an accurate depiction of home set-up and equipment. Currently, patient presenting with generalized weakness, cognitive impairments (though significantly HOH) and need for mod A to complete ADL management. Functional mobility not assessed despite encouragement stating, I just dont see the point right now. OT recommending lesser intensive venue for post acute rehab < 3 hours. OT will continue to follow acutely.      If plan is discharge home, recommend the following:   A lot of help with walking and/or transfers;A lot of help with bathing/dressing/bathroom;Assistance with cooking/housework;Direct supervision/assist for medications management;Direct supervision/assist for financial management;Assist for transportation;Help with stairs or ramp for entrance;Supervision due to cognitive status     Functional Status Assessment   Patient has had a recent decline in their functional status and demonstrates the ability to make significant improvements in function in a reasonable and predictable amount of time.     Equipment Recommendations   Other (comment) (defer to next venue)     Recommendations for Other Services         Precautions/Restrictions   Precautions Precautions: Fall Recall of Precautions/Restrictions: Impaired Restrictions Weight Bearing  Restrictions Per Provider Order: No     Mobility Bed Mobility Overal bed mobility: Needs Assistance Bed Mobility: Supine to Sit, Sit to Supine     Supine to sit: Min assist Sit to supine: Min assist   General bed mobility comments: Min A for safety    Transfers Overall transfer level: Needs assistance                 General transfer comment: declined despite encouragement      Balance Overall balance assessment: Mild deficits observed, not formally tested                                         ADL either performed or assessed with clinical judgement   ADL Overall ADL's : Needs assistance/impaired Eating/Feeding: NPO   Grooming: Set up;Sitting   Upper Body Bathing: Set up;Sitting   Lower Body Bathing: Moderate assistance;Sitting/lateral leans;Sit to/from stand   Upper Body Dressing : Set up;Sitting   Lower Body Dressing: Moderate assistance;Sitting/lateral leans;Sit to/from Market researcher Details (indicate cue type and reason): declining despite encouragement Toileting- Clothing Manipulation and Hygiene: Moderate assistance;Sitting/lateral lean;Sit to/from stand Toileting - Clothing Manipulation Details (indicate cue type and reason): for thoroughness     Functional mobility during ADLs: Moderate assistance;Cueing for safety;Cueing for sequencing General ADL Comments: Prior to this admission, patient living in a multi story home, and stating he is independent in his mobility and ADLs. However, per RN, patient and his wife are fairly impaired with daughter checking on them daily. Patient does endorse falls, but unable to state how many. Patient also unable to provide an accurate depiction  of home set-up and equipment. Currently, patient presenting with generalized weakness, cognitive impairments (though significantly HOH) and need for mod A to complete ADL management. Functional mobility not assessed despite encouragement stating, I  just dont see the point right now. OT recommending lesser intensive venue for post acute rehab < 3 hours. OT will continue to follow acutely.     Vision Baseline Vision/History: 1 Wears glasses Ability to See in Adequate Light: 0 Adequate Patient Visual Report: No change from baseline Vision Assessment?: No apparent visual deficits Additional Comments: Wears one contact in R eye     Perception Perception: Not tested       Praxis Praxis: Not tested       Pertinent Vitals/Pain Pain Assessment Pain Assessment: No/denies pain     Extremity/Trunk Assessment Upper Extremity Assessment Upper Extremity Assessment: Generalized weakness;Right hand dominant   Lower Extremity Assessment Lower Extremity Assessment: Defer to PT evaluation   Cervical / Trunk Assessment Cervical / Trunk Assessment: Normal   Communication Communication Communication: Impaired Factors Affecting Communication: Hearing impaired   Cognition Arousal: Alert Behavior During Therapy: WFL for tasks assessed/performed Cognition: No family/caregiver present to determine baseline, Cognition impaired   Orientation impairments: Time, Situation Awareness: Intellectual awareness impaired, Online awareness impaired Memory impairment (select all impairments): Short-term memory, Working memory Attention impairment (select first level of impairment): Sustained attention Executive functioning impairment (select all impairments): Problem solving OT - Cognition Comments: 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, did not recall the month though able to get it with PT moments earlier, STM deficits evident                 Following commands: Impaired Following commands impaired: Only follows one step commands consistently, Follows multi-step commands inconsistently     Cueing  General Comments   Cueing Techniques: Verbal cues  VSS on RA   Exercises     Shoulder Instructions      Home Living  Family/patient expects to be discharged to:: Private residence Living Arrangements: Spouse/significant other Available Help at Discharge: Available 24 hours/day;Family Type of Home: House Home Access: Stairs to enter     Home Layout: Multi-level     Bathroom Shower/Tub: Producer, television/film/video: Standard         Additional Comments: Inconsistent answers throughout session      Prior Functioning/Environment Prior Level of Function : Needs assist;History of Falls (last six months)             Mobility Comments: used a cane at baseline ADLs Comments: 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, unsure of accuracy    OT Problem List: Decreased activity tolerance;Decreased strength;Impaired balance (sitting and/or standing);Decreased coordination;Decreased cognition;Decreased safety awareness;Decreased knowledge of precautions   OT Treatment/Interventions: Self-care/ADL training;Therapeutic exercise;Energy conservation;DME and/or AE instruction;Manual therapy;Therapeutic activities;Patient/family education;Balance training;Cognitive remediation/compensation      OT Goals(Current goals can be found in the care plan section)   Acute Rehab OT Goals Patient Stated Goal: unable OT Goal Formulation: Patient unable to participate in goal setting Time For Goal Achievement: 09/07/24 Potential to Achieve Goals: Good   OT Frequency:  Min 2X/week    Co-evaluation              AM-PAC OT 6 Clicks Daily Activity     Outcome Measure Help from another person eating meals?: Total (NPO) Help from another person taking care of personal grooming?: A Little Help from another person toileting, which includes using toliet, bedpan, or urinal?:  A Lot Help from another person bathing (including washing, rinsing, drying)?: A Lot Help from another person to put on and taking off regular upper body clothing?: A Little Help from another person to put on and taking off regular  lower body clothing?: A Lot 6 Click Score: 13   End of Session Nurse Communication: Mobility status  Activity Tolerance: Patient tolerated treatment well Patient left: in bed;with call bell/phone within reach  OT Visit Diagnosis: Unsteadiness on feet (R26.81);Other abnormalities of gait and mobility (R26.89);Repeated falls (R29.6);Muscle weakness (generalized) (M62.81);History of falling (Z91.81);Other symptoms and signs involving cognitive function                Time: 9077-9063 OT Time Calculation (min): 14 min Charges:  OT General Charges $OT Visit: 1 Visit OT Evaluation $OT Eval Moderate Complexity: 1 Mod  Ronal Gift E. Tina Gruner, OTR/L Acute Rehabilitation Services [phone]   Ronal Gift Salt 08/24/2024, 10:06 AM

## 2024-08-24 NOTE — Evaluation (Signed)
 Physical Therapy Evaluation Patient Details Name: Jose [name] MRN: [id number] DOB: 1936/04/30 Today's Date: 08/24/2024  History of Present Illness  88 y.o. male presents to [facility] hospital on 08/23/2024 with generalized weakness, found to be positive for COVID. PMH includes CABG, HLD, HTN, CHF.  Clinical Impression  Pt presents to PT with deficits in functional mobility, gait, balance, cognition. Pt reports a history of falls over the last 50 years, also reports to OT that he is still working, unsure of accuracy of pt reports. Pt remains at a high risk for falls due to imbalance and generalized weakness, requiring physical assistance during bed mobility and ambulation. Pt also reports he has multiple flights of stairs to manage at home, seemingly placing him at a high risk for potential injurious falls at this time. Patient will benefit from continued inpatient follow up therapy, <3 hours/day.       If plan is discharge home, recommend the following: A lot of help with walking and/or transfers;A lot of help with bathing/dressing/bathroom;Assistance with cooking/housework;Direct supervision/assist for medications management;Direct supervision/assist for financial management;Assist for transportation;Help with stairs or ramp for entrance;Supervision due to cognitive status   Can travel by private vehicle   Yes    Equipment Recommendations BSC/3in1  Recommendations for Other Services       Functional Status Assessment Patient has had a recent decline in their functional status and demonstrates the ability to make significant improvements in function in a reasonable and predictable amount of time.     Precautions / Restrictions Precautions Precautions: Fall Recall of Precautions/Restrictions: Impaired Precaution/Restrictions Comments: COVID+ Restrictions Weight Bearing Restrictions Per Provider Order: No      Mobility  Bed Mobility Overal bed mobility: Needs Assistance Bed Mobility:  Supine to Sit, Sit to Supine     Supine to sit: Min assist Sit to supine: Min assist        Transfers Overall transfer level: Needs assistance Equipment used: Rolling walker (2 wheels) Transfers: Sit to/from Stand Sit to Stand: Contact guard assist, From elevated surface                Ambulation/Gait Ambulation/Gait assistance: Min assist Gait Distance (Feet): 20 Feet Assistive device: Rolling walker (2 wheels) Gait Pattern/deviations: Step-to pattern, Trunk flexed Gait velocity: reduced Gait velocity interpretation: <1.31 ft/sec, indicative of household ambulator   General Gait Details: pt with slowed step-to gait, increased trunk flexion over RW  Stairs            Wheelchair Mobility     Tilt Bed    Modified Rankin (Stroke Patients Only)       Balance Overall balance assessment: Needs assistance Sitting-balance support: Feet supported, No upper extremity supported Sitting balance-Leahy Scale: Fair     Standing balance support: Bilateral upper extremity supported, Reliant on assistive device for balance Standing balance-Leahy Scale: Poor                               Pertinent Vitals/Pain Pain Assessment Pain Assessment: No/denies pain    Home Living Family/patient expects to be discharged to:: Private residence Living Arrangements: Spouse/significant other Available Help at Discharge: Available 24 hours/day;Family Type of Home: House Home Access: Stairs to enter Entrance Stairs-Rails: Doctor, general practice of Steps: 14 Alternate Level Stairs-Number of Steps: flight Home Layout: Multi-level Home Equipment: Agricultural consultant (2 wheels);Cane - single point Additional Comments: Inconsistent answers throughout session    Prior Function Prior Level of Function :  Needs assist;History of Falls (last six months)             Mobility Comments: ambulatory with [facility], reports a signficant history of falls for the last 50+  years. 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, manufacturing systems, unsure of accuracy     Extremity/Trunk Assessment   Upper Extremity Assessment Upper Extremity Assessment: Generalized weakness    Lower Extremity Assessment Lower Extremity Assessment: Generalized weakness    Cervical / Trunk Assessment Cervical / Trunk Assessment: Normal  Communication   Communication Communication: Impaired Factors Affecting Communication: Hearing impaired    Cognition Arousal: Alert Behavior During Therapy: WFL for tasks assessed/performed   PT - Cognitive impairments: No family/caregiver present to determine baseline, Safety/Judgement, Problem solving                         Following commands: Impaired Following commands impaired: Only follows one step commands consistently, Follows multi-step commands inconsistently     Cueing Cueing Techniques: Verbal cues     General Comments General comments (skin integrity, edema, etc.): VSS on RA    Exercises     Assessment/Plan    PT Assessment Patient needs continued PT services  PT Problem List Decreased strength;Decreased activity tolerance;Decreased balance;Decreased mobility;Decreased knowledge of use of DME;Decreased safety awareness;Decreased knowledge of precautions       PT Treatment Interventions DME instruction;Gait training;Stair training;Functional mobility training;Therapeutic activities;Therapeutic exercise;Balance training;Neuromuscular re-education;Cognitive remediation;Patient/family education    PT Goals (Current goals can be found in the Care Plan section)  Acute Rehab PT Goals Patient Stated Goal: to reduce falls risk PT Goal Formulation: With patient Time For Goal Achievement: 09/07/24 Potential to Achieve Goals: Fair    Frequency Min 2X/week     Co-evaluation               AM-PAC PT 6 Clicks Mobility  Outcome Measure Help needed turning from  your back to your side while in a flat bed without using bedrails?: A Little Help needed moving from lying on your back to sitting on the side of a flat bed without using bedrails?: A Little Help needed moving to and from a bed to a chair (including a wheelchair)?: A Little Help needed standing up from a chair using your arms (e.g., wheelchair or bedside chair)?: A Little Help needed to walk in hospital room?: A Little Help needed climbing 3-5 steps with a railing? : Total 6 Click Score: 16    End of Session Equipment Utilized During Treatment: Gait belt Activity Tolerance: Patient tolerated treatment well Patient left: in bed;with call bell/phone within reach Nurse Communication: Mobility status PT Visit Diagnosis: Other abnormalities of gait and mobility (R26.89);Muscle weakness (generalized) (M62.81);History of falling (Z91.81)    Time: 9149-9079 PT Time Calculation (min) (ACUTE ONLY): 30 min   Charges:   PT Evaluation $PT Eval Low Complexity: 1 Low   PT General Charges $$ ACUTE PT VISIT: 1 Visit         Bernardino JINNY Ruth, PT, DPT Acute Rehabilitation Office [phone]   Bernardino JINNY Ruth 08/24/2024, 12:29 PM

## 2024-08-24 NOTE — ED Notes (Signed)
 CCMD called to report transfer to [phone].

## 2024-08-24 NOTE — ED Notes (Signed)
 PT at bedside.

## 2024-08-24 NOTE — ED Notes (Addendum)
 This RN placed patient on 2L [place]. Pt down to 88% RA while sleeping. Pt is not at 94% with [place]

## 2024-08-24 NOTE — Progress Notes (Signed)
 Progress Note   Patient: Jose [name] FMW:[id number] DOB: Feb 01, 1936 DOA: 08/23/2024     0 DOS: the patient was seen and examined on 08/24/2024   Brief hospital course: MAKS CAVALLERO is a 88 y.o. male with medical history significant for coronary artery disease status post CABG, hyperlipidemia, hypertension, chronic combined diastolic and systolic CHF presented to the ER due to generalized weakness for the past 2 to 3 days, subjective low-grade fevers, chills, decreased oral intake.  He was found COVID positive, admitted to TRH service for further management  Assessment and Plan: COVID-19 viral infection Low grade fever noted, no hypoxia. Symptoms started less than 5 days ago Patient refused IV remdesivir. No steroids needed.  Continue as needed bronchodilators nebulizers. Encourage out of bed, Incentive spirometer PT/ OT evaluation.   Poor oral intake Dysphagia- In the setting of COVID-19 viral infection. Gentle IV fluid hydration. SLP evaluation ordered/ Continue as needed antiemetics. Nursing supportive care. Fall/ aspiration precautions.   Chronic combined diastolic and systolic CHF Coronary artery disease status post CABG Euvolemic on exam Resume home regimen once home meds are reconciled. Monitor strict I's and O's and daily weight.  CKD stage 3A- Creatinine seems at baseline. Monitor daily renal function.   Hyperlipidemia Resume home regimen   Generalized weakness- PT/ OT evaluation for dc needs.     Out of bed to chair. Incentive spirometry. Nursing supportive care. Fall, aspiration precautions. Diet:  Diet Orders (From admission, onward)     Start     Ordered   08/24/24 1355  Diet clear liquid Room service appropriate? No; Fluid consistency: Thin  Diet effective now       Question Answer Comment  Room service appropriate? No   Fluid consistency: Thin      08/24/24 1355           DVT prophylaxis: enoxaparin  (LOVENOX ) injection 40 mg Start:  08/23/24 2130  Level of care: Telemetry Medical   Code Status: Full Code  Subjective: Patient is seen and examined today morning. He is weak, did not get out of bed. Eating poor. Denies any complaints.  Physical Exam: Vitals:   08/24/24 0730 08/24/24 0830 08/24/24 1030 08/24/24 1222  BP: (!) 142/65 (!) 142/72 134/78   Pulse: 68 75 79   Resp: 20 20 20    Temp:   98.5 F (36.9 C) 98.6 F (37 C)  TempSrc:   Oral Oral  SpO2: 95% 97% 98%   Weight:      Height:        General - Elderly Caucasian weak male, no apparent distress HEENT - PERRLA, EOMI, atraumatic head, non tender sinuses. Lung - Clear, no rales, rhonchi, wheezes. Heart - S1, S2 heard, no murmurs, rubs, trace pedal edema. Abdomen - Soft, non tender, bowel sounds good Neuro - Alert, awake and oriented x 3, non focal exam. Skin - Warm and dry.  Data Reviewed:      Latest Ref Rng & Units 08/24/2024    3:38 AM 08/23/2024    5:15 PM 09/13/2021    1:07 PM  CBC  WBC 4.0 - 10.5 K/uL 12.7  8.2  8.1   Hemoglobin 13.0 - 17.0 g/dL 84.5  84.3  84.5   Hematocrit 39.0 - 52.0 % 46.7  47.2  47.3   Platelets 150 - 400 K/uL 146  145  171       Latest Ref Rng & Units 08/24/2024    3:38 AM 08/23/2024    5:15 PM 09/13/2021  1:07 PM  BMP  Glucose 70 - 99 mg/dL 865  870  896   BUN 8 - 23 mg/dL 21  19  15    Creatinine 0.61 - 1.24 mg/dL 8.66  8.72  8.89   Sodium 135 - 145 mmol/L 137  137  141   Potassium 3.5 - 5.1 mmol/L 4.1  4.1  4.1   Chloride 98 - 111 mmol/L 102  100  106   CO2 22 - 32 mmol/L 22  24  27    Calcium 8.9 - 10.3 mg/dL 8.5  8.7  9.3    CT Head Wo Contrast Result Date: 08/23/2024 EXAM: CT HEAD AND CERVICAL SPINE 08/23/2024 09:01:00 PM TECHNIQUE: CT of the head and cervical spine was performed without the administration of intravenous contrast. Multiplanar reformatted images are provided for review. Automated exposure control, iterative reconstruction, and/or weight based adjustment of the mA/kV was utilized to reduce  the radiation dose to as low as reasonably achievable. COMPARISON: 09/13/2021 CLINICAL HISTORY: Fall, possibly on a blood thinner. S/p fall. C/o neck pain. 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 Confluent chronic ischemic white matter changes. Mild volume loss. No acute intracranial hemorrhage. No mass effect or midline shift. No abnormal extra-axial fluid collection. No hydrocephalus. ORBITS: No acute abnormality. SINUSES AND MASTOIDS: No acute abnormality. SOFT TISSUES AND SKULL: No acute skull fracture. No acute soft tissue abnormality. CT CERVICAL SPINE BONES AND ALIGNMENT: No acute fracture or traumatic malalignment. DEGENERATIVE CHANGES: No significant degenerative changes. SOFT TISSUES: No prevertebral soft tissue swelling. IMPRESSION: 1. No acute intracranial abnormality. 2. 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 3. No acute fracture or traumatic malalignment of the cervical spine. Electronically signed by: Franky Stanford MD 08/23/2024 09:12 PM EDT RP Workstation: [id number]   CT Cervical Spine Wo Contrast Result Date: 08/23/2024 EXAM: CT HEAD AND CERVICAL SPINE 08/23/2024 09:01:00 PM TECHNIQUE: CT of the head and cervical spine was performed without the administration of intravenous contrast. Multiplanar reformatted images are provided for review. Automated exposure control, iterative reconstruction, and/or weight based adjustment of the mA/kV was utilized to reduce the radiation dose to as low as reasonably achievable. COMPARISON: 09/13/2021 CLINICAL HISTORY: Fall, possibly on a blood thinner. S/p fall. C/o neck pain. 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 Confluent chronic ischemic white matter changes. Mild volume loss. No acute intracranial hemorrhage. No mass effect or midline shift. No abnormal  extra-axial fluid collection. No hydrocephalus. ORBITS: No acute abnormality. SINUSES AND MASTOIDS: No acute abnormality. SOFT TISSUES AND SKULL: No acute skull fracture. No acute soft tissue abnormality. CT CERVICAL SPINE BONES AND ALIGNMENT: No acute fracture or traumatic malalignment. DEGENERATIVE CHANGES: No significant degenerative changes. SOFT TISSUES: No prevertebral soft tissue swelling. IMPRESSION: 1. No acute intracranial abnormality. 2. 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 3. No acute fracture or traumatic malalignment of the cervical spine. Electronically signed by: Franky Stanford MD 08/23/2024 09:12 PM EDT RP Workstation: [id number]   DG Chest 2 View Result Date: 08/23/2024 CLINICAL DATA:  Shortness of breath with cough EXAM: CHEST - 2 VIEW COMPARISON:  Chest x-ray 01/07/2017 FINDINGS: Patient is status post cardiac surgery. The heart size and mediastinal contours are within normal limits. Both lungs are clear. The visualized skeletal structures are unremarkable. IMPRESSION: No active cardiopulmonary disease. Electronically Signed   By: Greig Pique M.D.   On: 08/23/2024 18:19  Family Communication: Discussed with patient, understand and agree. All questions answered.  Disposition: Status is: Inpatient Remains inpatient appropriate because: gentle IV fluids, oral diet, PT  Planned Discharge Destination: Home with Home Health     Time spent: 42 minutes  Author: Concepcion Riser, MD 08/24/2024 6:08 PM Secure chat 7am to 7pm For on call review [email or web address].

## 2024-08-25 DIAGNOSIS — U071 COVID-19: Secondary | ICD-10-CM | POA: Diagnosis not present

## 2024-08-25 LAB — BASIC METABOLIC PANEL WITH GFR
Anion gap: 11 (ref 5–15)
BUN: 25 mg/dL — ABNORMAL HIGH (ref 8–23)
CO2: 21 mmol/L — ABNORMAL LOW (ref 22–32)
Calcium: 8.3 mg/dL — ABNORMAL LOW (ref 8.9–10.3)
Chloride: 104 mmol/L (ref 98–111)
Creatinine, Ser: 1.11 mg/dL (ref 0.61–1.24)
GFR, Estimated: >60 mL/min (ref >60)
Glucose, Bld: 116 mg/dL — ABNORMAL HIGH (ref 70–99)
Potassium: 3.6 mmol/L (ref 3.5–5.1)
Sodium: 136 mmol/L (ref 135–145)

## 2024-08-25 LAB — CBC
HCT: 42 % (ref 39.0–52.0)
Hemoglobin: 14.1 g/dL (ref 13.0–17.0)
MCH: 31.1 pg (ref 26.0–34.0)
MCHC: 33.6 g/dL (ref 30.0–36.0)
MCV: 92.7 fL (ref 80.0–100.0)
Platelets: 129 K/uL — ABNORMAL LOW (ref 150–400)
RBC: 4.53 MIL/uL (ref 4.22–5.81)
RDW: 12.6 % (ref 11.5–15.5)
WBC: 12.1 K/uL — ABNORMAL HIGH (ref 4.0–10.5)
nRBC: 0 % (ref 0.0–0.2)

## 2024-08-25 MED ORDER — ENSURE PLUS HIGH PROTEIN PO LIQD
237.0000 mL | Freq: Three times a day (TID) | ORAL | Status: DC
  Administered 2024-08-25 – 2024-09-02 (×18): 237 mL via ORAL

## 2024-08-25 MED ORDER — GUAIFENESIN ER 600 MG PO TB12
600.0000 mg | ORAL_TABLET | Freq: Two times a day (BID) | ORAL | Status: DC
  Administered 2024-08-25 – 2024-09-02 (×17): 600 mg via ORAL
  Filled 2024-08-25 (×17): qty 1

## 2024-08-25 NOTE — TOC Transition Note (Signed)
 Transition of Care [facility] - Discharge Note   Patient Details  Name: Jose [name] MRN: [id number] Date of Birth: [birth year]  Transition of Care [facility]) CM/SW Contact:  [name] Gell, RN Phone Number: 08/25/2024, 4:21 PM   Clinical Narrative:     Beatris w patient at bedside. He refuses SNF.  Requested HH set up, agreeable to have me call daughter.  Explained to daughter that he has refused SNF and we cannot force him to go. Discussed supports with [facility] services and left brochures for private duty in room. Daughter verbalized understanding. Agreeable to [facility] servces, [facility] able to accept case.  She states they have several walkers in the home, would like BSC to be delivered to the home and transport by non emergency ambulance. Daughter and wife are both aware that patient may receive bill for transport and state they are agreeable to pay.      Barriers to Discharge: No Barriers Identified   Patient Goals and CMS Choice Patient states their goals for this hospitalization and ongoing recovery are:: to go home CMS Medicare.gov Compare Post Acute Care list provided to:: Patient Choice offered to / list presented to : Patient      Discharge Placement                       Discharge Plan and Services Additional resources added to the After Visit Summary for     Discharge Planning Services: CM Consult Post Acute Care Choice: Durable Medical Equipment, Home Health          DME Arranged: Bedside commode DME Agency: [employer] Date DME Agency Contacted: 08/25/24 Time DME Agency Contacted: [phone] Representative spoke with at DME Agency: Jermaine- to deliver to the home HH Arranged: PT, OT HH Agency: [facility] Date [facility] Agency Contacted: 08/25/24 Time HH Agency Contacted: 1621 Representative spoke with at [facility] Agency: Darleene  Social Drivers of Health (SDOH) Interventions SDOH Screenings   Food Insecurity: No Food Insecurity (08/24/2024)  Housing: Low Risk   (08/24/2024)  Transportation Needs: No Transportation Needs (08/24/2024)  Utilities: Not At Risk (08/24/2024)  Social Connections: Moderately Isolated (08/24/2024)  Tobacco Use: Low Risk  (08/23/2024)     Readmission Risk Interventions     No data to display

## 2024-08-25 NOTE — Progress Notes (Signed)
 Progress Note   Patient: Jose [name] FMW:[id number] DOB: 1936/04/08 DOA: 08/23/2024     1 DOS: the patient was seen and examined on 08/25/2024   Brief hospital course:  Jose [name] is a 88 y.o. male with medical history significant for coronary artery disease status post CABG, hyperlipidemia, hypertension, chronic combined diastolic and systolic CHF presented to the ER due to generalized weakness for the past 2 to 3 days, subjective low-grade fevers, chills, decreased oral intake.  He was found COVID positive, admitted to TRH service for further management  Assessment and Plan:  COVID-19 viral infection Low grade fever noted, no hypoxia. Symptoms started less than 5 days prior to admission Patient refused IV remdesivir. No steroids needed.  No hypoxia His x-ray with no acute findings Continue as needed bronchodilators nebulizers. Encourage out of bed, Incentive spirometer PT/ OT evaluation.  Recommendation for SNF placement   Poor oral intake Dysphagia- In the setting of COVID-19 viral infection. Gentle IV fluid hydration. SLP evaluation ordered/tolerating regular diet Continue as needed antiemetics. Nursing supportive care. Fall/ aspiration precautions. Will add Ensure, continue with boost   Chronic combined diastolic and systolic CHF Coronary artery disease status post CABG Euvolemic on exam Resume home regimen once home meds are reconciled. Monitor strict I's and O's and daily weight.  CKD stage 3A- Creatinine seems at baseline. Monitor daily renal function.   Hyperlipidemia Resume home regimen   Generalized weakness- PT/ OT evaluation for dc needs.     Out of bed to chair. Incentive spirometry. Nursing supportive care. Fall, aspiration precautions. Diet:  Diet Orders (From admission, onward)     Start     Ordered   08/25/24 0907  Diet regular Room service appropriate? Yes with Assist; Fluid consistency: Thin  Diet effective now       Question  Answer Comment  Room service appropriate? Yes with Assist   Fluid consistency: Thin      08/25/24 0906           DVT prophylaxis: enoxaparin  (LOVENOX ) injection 40 mg Start: 08/23/24 2130  Level of care: Telemetry Medical   Code Status: Full Code  Subjective:  Patient himself denies any complaints today, reports he is feeling better, asking to go home  Physical Exam: Vitals:   08/24/24 2329 08/25/24 0400 08/25/24 0800 08/25/24 1309  BP: 139/66 124/69 128/74 (!) 102/49  Pulse: 66 71 74 70  Resp: (!) 21 20 (!) 23 18  Temp: 98.3 F (36.8 C) 98 F (36.7 C) 98.1 F (36.7 C) 98.6 F (37 C)  TempSrc: Oral Oral Oral Oral  SpO2: 90% 92% 93% 95%  Weight:      Height:       Awake Alert, Oriented X 3, frail, no apparent distress Symmetrical Chest wall movement, Good air movement bilaterally, CTAB RRR,No Gallops,Rubs or new Murmurs, No Parasternal Heave +ve B.Sounds, Abd Soft, No tenderness, No rebound - guarding or rigidity. No Cyanosis, Clubbing or edema, No new Rash or bruise    Data Reviewed:      Latest Ref Rng & Units 08/25/2024    2:33 AM 08/24/2024    3:38 AM 08/23/2024    5:15 PM  CBC  WBC 4.0 - 10.5 K/uL 12.1  12.7  8.2   Hemoglobin 13.0 - 17.0 g/dL 85.8  84.5  84.3   Hematocrit 39.0 - 52.0 % 42.0  46.7  47.2   Platelets 150 - 400 K/uL 129  146  145  Latest Ref Rng & Units 08/25/2024    2:33 AM 08/24/2024    3:38 AM 08/23/2024    5:15 PM  BMP  Glucose 70 - 99 mg/dL 883  865  870   BUN 8 - 23 mg/dL 25  21  19    Creatinine 0.61 - 1.24 mg/dL 8.88  8.66  8.72   Sodium 135 - 145 mmol/L 136  137  137   Potassium 3.5 - 5.1 mmol/L 3.6  4.1  4.1   Chloride 98 - 111 mmol/L 104  102  100   CO2 22 - 32 mmol/L 21  22  24    Calcium 8.9 - 10.3 mg/dL 8.3  8.5  8.7    CT Head Wo Contrast Result Date: 08/23/2024 EXAM: CT HEAD AND CERVICAL SPINE 08/23/2024 09:01:00 PM TECHNIQUE: CT of the head and cervical spine was performed without the administration of intravenous  contrast. Multiplanar reformatted images are provided for review. Automated exposure control, iterative reconstruction, and/or weight based adjustment of the mA/kV was utilized to reduce the radiation dose to as low as reasonably achievable. COMPARISON: 09/13/2021 CLINICAL HISTORY: Fall, possibly on a blood thinner. S/p fall. C/o neck pain. 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 Confluent chronic ischemic white matter changes. Mild volume loss. No acute intracranial hemorrhage. No mass effect or midline shift. No abnormal extra-axial fluid collection. No hydrocephalus. ORBITS: No acute abnormality. SINUSES AND MASTOIDS: No acute abnormality. SOFT TISSUES AND SKULL: No acute skull fracture. No acute soft tissue abnormality. CT CERVICAL SPINE BONES AND ALIGNMENT: No acute fracture or traumatic malalignment. DEGENERATIVE CHANGES: No significant degenerative changes. SOFT TISSUES: No prevertebral soft tissue swelling. IMPRESSION: 1. No acute intracranial abnormality. 2. 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 3. No acute fracture or traumatic malalignment of the cervical spine. Electronically signed by: Franky Stanford MD 08/23/2024 09:12 PM EDT RP Workstation: [id number]   CT Cervical Spine Wo Contrast Result Date: 08/23/2024 EXAM: CT HEAD AND CERVICAL SPINE 08/23/2024 09:01:00 PM TECHNIQUE: CT of the head and cervical spine was performed without the administration of intravenous contrast. Multiplanar reformatted images are provided for review. Automated exposure control, iterative reconstruction, and/or weight based adjustment of the mA/kV was utilized to reduce the radiation dose to as low as reasonably achievable. COMPARISON: 09/13/2021 CLINICAL HISTORY: Fall, possibly on a blood thinner. S/p fall. C/o neck pain. 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 Confluent chronic ischemic white matter changes. Mild volume loss. No acute intracranial hemorrhage. No mass effect or midline shift. No abnormal extra-axial fluid collection. No hydrocephalus. ORBITS: No acute abnormality. SINUSES AND MASTOIDS: No acute abnormality. SOFT TISSUES AND SKULL: No acute skull fracture. No acute soft tissue abnormality. CT CERVICAL SPINE BONES AND ALIGNMENT: No acute fracture or traumatic malalignment. DEGENERATIVE CHANGES: No significant degenerative changes. SOFT TISSUES: No prevertebral soft tissue swelling. IMPRESSION: 1. No acute intracranial abnormality. 2. 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 3. No acute fracture or traumatic malalignment of the cervical spine. Electronically signed by: Franky Stanford MD 08/23/2024 09:12 PM EDT RP Workstation: [id number]   DG Chest 2 View Result Date: 08/23/2024 CLINICAL DATA:  Shortness of breath with cough EXAM: CHEST - 2 VIEW COMPARISON:  Chest x-ray 01/07/2017 FINDINGS: Patient is status post cardiac surgery. The heart size and mediastinal contours are within normal limits. Both lungs are clear. The visualized skeletal structures  are unremarkable. IMPRESSION: No active cardiopulmonary disease. Electronically Signed   By: Greig Pique M.D.   On: 08/23/2024 18:19    Family Communication: Discussed with patient, understand and agree. All questions answered.  Left daughter voice message  Disposition: Status is: Inpatient Remains inpatient appropriate because: gentle IV fluids, oral diet, PT  Planned Discharge Destination: Home with Home Health     Author: Brayton Lye, MD 08/25/2024 2:42 PM Secure chat 7am to 7pm For on call review [email or web address].

## 2024-08-25 NOTE — Progress Notes (Addendum)
 Speech Language Pathology Treatment: Dysphagia  Patient Details Name: Jose [name] MRN: [id number] DOB: Mar 10, 1936 Today's Date: 08/25/2024 Time: 9149-9087 SLP Time Calculation (min) (ACUTE ONLY): 22 min  Assessment / Plan / Recommendation Clinical Impression  Patient seen for skilled SLP to follow up regarding his dysphagia goals. He denies issues swallowing.  Today his breakfast tray was at bedside and pt reports he had difficulty managing the liquids only as he does not eat those kinds of things. Oral inspection revealed erythema and ? mild blistering but pt denies discomfort.       He was observed consuming water and crackers with adequate oral clearance and subjectively observed swallow. 3-Ounce water challenge was not passed as pt required rest break after approx 2.5 ounces of water - but no coughing observed post-swallow.    Wet voice and delayed cough noted x2 during intake with pt clearing viscous green and yellow tinged secretions. Wet voice was cleared and did not recur - thus suspect COVID related secretions and not dysphagia.   Of note, pt reports he manages his GERD by having taken vinegar and lemon previously but no longer has symptoms. Educated pt to general precautions and advanced diet to regular/thin and SLP will sign off. Thanks for this consult.     HPI HPI: Jose [name] is a 88 y.o. male with medical history significant for coronary artery disease status post CABG, hyperlipidemia, hypertension, chronic combined diastolic and systolic CHF (2D echo 2022), presents to the ER due to generalized weakness for the past 2 to 3 days.  Associated with subjective low-grade fevers at home.  The patient had a fall yesterday when going up the steps.  Endorses chills, congestion, productive cough, and generalized malaise.  Due to persistent symptomatology, the patient was brought into the ER by family members.  At baseline, the patient is independent of all his activities of daily  living and takes care of his wife.  Has had a decrease in oral intake which he attributes to feeling poorly. ST consulted for swallow evaluation; pt currently NPO.      SLP Plan  All goals met          Recommendations  Diet recommendations: Regular;Thin liquid Liquids provided via: Straw;Cup Medication Administration: Other (Comment) (as tolerated, start intake with liquids) Supervision: Patient able to self feed Compensations: Minimize environmental distractions;Slow rate;Small sips/bites;Other (Comment) (start intake with liquids)                  Oral care BID   None Dysphagia, unspecified (R13.10)     All goals met    Jose POUR, MS [facility] SLP Acute Rehab Services Office [phone]  Jose [name]  08/25/2024, 9:24 AM

## 2024-08-25 NOTE — Plan of Care (Signed)
   Problem: Education: Goal: Knowledge of General Education information will improve Description Including pain rating scale, medication(s)/side effects and non-pharmacologic comfort measures Outcome: Progressing

## 2024-08-25 NOTE — Progress Notes (Signed)
 Physical Therapy Treatment Patient Details Name: Jose [name] MRN: [id number] DOB: 05/13/1936 Today's Date: 08/25/2024   History of Present Illness 88 y.o. male presents to [facility] hospital on 08/23/2024 with generalized weakness, found to be positive for COVID. PMH includes CABG, HLD, HTN, CHF.    PT Comments  Patient progressing to hallway ambulation and stair training, though not safe for home with the amount of help his family can offer.  He fell PTA and is continued high fall risk due to poor deficit awareness, decreased strength, balance and safety.  Needing help for socks and shoes and for managing lines/condom cath, etc.  Continue to recommend post-acute inpatient rehab (<3 hours/day) though if pt chooses home recommend HHPT, aide and RNCM to provide list of sitter agencies to family.  Thanks.    If plan is discharge home, recommend the following: Assist for transportation;A lot of help with walking and/or transfers;Help with stairs or ramp for entrance;Assistance with cooking/housework;A lot of help with bathing/dressing/bathroom   Can travel by private vehicle     Yes  Equipment Recommendations  BSC/3in1    Recommendations for Other Services       Precautions / Restrictions Precautions Precautions: Fall Recall of Precautions/Restrictions: Impaired Precaution/Restrictions Comments: COVID+     Mobility  Bed Mobility Overal bed mobility: Needs Assistance Bed Mobility: Supine to Sit, Sit to Supine     Supine to sit: Supervision, Used rails, HOB elevated Sit to supine: Supervision, Used rails   General bed mobility comments: increased time, cues for using rail for repositioning and cues needed    Transfers Overall transfer level: Needs assistance Equipment used: Rolling walker (2 wheels) Transfers: Sit to/from Stand Sit to Stand: Mod assist           General transfer comment: posterior bias mod A for coming forward for fall prevention, cues for hand placement     Ambulation/Gait Ambulation/Gait assistance: Min assist, Contact guard assist, Supervision Gait Distance (Feet): 200 Feet Assistive device: Rolling walker (2 wheels)         General Gait Details: increased time, assist for maneuvering around obstacles, tendency to run into obstacles on the R, cues for walker proximity and posture, initial min A fading to close S   Stairs Stairs: Yes Stairs assistance: Min assist Stair Management: Two rails, Step to pattern, Alternating pattern, Forwards Number of Stairs: 10 General stair comments: forward with both rails (wide) and step to sequence, occasional step through, L foot not getting fully on step so mod cues for safety and foot placement, descending initial side step due to rail out of reach then with both rails and increased time to clear foot from step with cues for reaching further forward for increased anterior weight shift, very slow and laborious and noted with hand on walker or stairs poor pleth on SpO2 reading into 70's though with hand free and improved pleth in 90's with only minimal dyspnea   Wheelchair Mobility     Tilt Bed    Modified Rankin (Stroke Patients Only)       Balance Overall balance assessment: Needs assistance Sitting-balance support: Feet supported Sitting balance-Leahy Scale: Fair Sitting balance - Comments: limited ability to lean forward to don shoes   Standing balance support: Bilateral upper extremity supported Standing balance-Leahy Scale: Poor Standing balance comment: UE support for balance, initial posterior bias  Communication Communication Communication: Impaired Factors Affecting Communication: Hearing impaired  Cognition Arousal: Alert Behavior During Therapy: WFL for tasks assessed/performed   PT - Cognitive impairments: No family/caregiver present to determine baseline, Safety/Judgement, Problem solving                          Following commands: Impaired Following commands impaired: Only follows one step commands consistently, Follows multi-step commands inconsistently, Follows one step commands with increased time    Cueing Cueing Techniques: Verbal cues  Exercises      General Comments General comments (skin integrity, edema, etc.): Daughter in the room and concerned about pt home today with her hands full taking care of her mother and she ambulates with a cane herself.  Also frustrated with increased response time for alarms on telemetry, cold food for breakfast and pt's continued illness.      Pertinent Vitals/Pain Pain Assessment Pain Assessment: No/denies pain    Home Living                          Prior Function            PT Goals (current goals can now be found in the care plan section) Progress towards PT goals: Progressing toward goals    Frequency    Min 2X/week      PT Plan      Co-evaluation              AM-PAC PT 6 Clicks Mobility   Outcome Measure  Help needed turning from your back to your side while in a flat bed without using bedrails?: A Little Help needed moving from lying on your back to sitting on the side of a flat bed without using bedrails?: A Little Help needed moving to and from a bed to a chair (including a wheelchair)?: A Little Help needed standing up from a chair using your arms (e.g., wheelchair or bedside chair)?: A Lot Help needed to walk in hospital room?: A Little Help needed climbing 3-5 steps with a railing? : A Lot 6 Click Score: 16    End of Session Equipment Utilized During Treatment: Gait belt Activity Tolerance: Patient tolerated treatment well Patient left: in bed;with call bell/phone within reach;with bed alarm set;with family/visitor present   PT Visit Diagnosis: Other abnormalities of gait and mobility (R26.89);Muscle weakness (generalized) (M62.81);History of falling (Z91.81)     Time: 8649-8570 PT Time  Calculation (min) (ACUTE ONLY): 39 min  Charges:    $Gait Training: 23-37 mins $Self Care/Home Management: 8-22 PT General Charges $$ ACUTE PT VISIT: 1 Visit                     Micheline Portal, PT Acute Rehabilitation Services Office:[phone] 08/25/2024    Montie Portal 08/25/2024, 2:43 PM

## 2024-08-26 DIAGNOSIS — W19XXXA Unspecified fall, initial encounter: Secondary | ICD-10-CM | POA: Diagnosis not present

## 2024-08-26 DIAGNOSIS — U071 COVID-19: Secondary | ICD-10-CM | POA: Diagnosis not present

## 2024-08-26 DIAGNOSIS — R5383 Other fatigue: Secondary | ICD-10-CM | POA: Diagnosis not present

## 2024-08-26 LAB — BASIC METABOLIC PANEL WITH GFR
Anion gap: 10 (ref 5–15)
BUN: 27 mg/dL — ABNORMAL HIGH (ref 8–23)
CO2: 25 mmol/L (ref 22–32)
Calcium: 8.3 mg/dL — ABNORMAL LOW (ref 8.9–10.3)
Chloride: 103 mmol/L (ref 98–111)
Creatinine, Ser: 1.19 mg/dL (ref 0.61–1.24)
GFR, Estimated: 59 mL/min — ABNORMAL LOW (ref >60)
Glucose, Bld: 133 mg/dL — ABNORMAL HIGH (ref 70–99)
Potassium: 3.6 mmol/L (ref 3.5–5.1)
Sodium: 138 mmol/L (ref 135–145)

## 2024-08-26 LAB — CBC
HCT: 41 % (ref 39.0–52.0)
Hemoglobin: 14.1 g/dL (ref 13.0–17.0)
MCH: 31.5 pg (ref 26.0–34.0)
MCHC: 34.4 g/dL (ref 30.0–36.0)
MCV: 91.7 fL (ref 80.0–100.0)
Platelets: 118 K/uL — ABNORMAL LOW (ref 150–400)
RBC: 4.47 MIL/uL (ref 4.22–5.81)
RDW: 12.5 % (ref 11.5–15.5)
WBC: 6.8 K/uL (ref 4.0–10.5)
nRBC: 0 % (ref 0.0–0.2)

## 2024-08-26 NOTE — Progress Notes (Signed)
 PROGRESS NOTE        PATIENT DETAILS Name: Jose [name] Age: 88 y.o. Sex: male Date of Birth: March 13, 1936 Admit Date: 08/23/2024 Admitting Physician Terry LOISE Hurst, DO ERE:Emzcndu, [name], FNP  Brief Summary: Patient is a 88 y.o.  male with history of CAD s/p CABG, chronic combined CHF, HLD, HTN-who presented with weakness/fever/chills/poor oral intake-was found to have COVID-19 infection.  Significant events: 9/21>> admit to [facility]  Significant studies: 9/21>> CXR: No PNA 9/21>> CT head: No acute intracranial abnormality 9/21>> CT C-spine: No fracture  Significant microbiology data: 9/21>> COVID PCR: Positive 9/21>> influenza/RSV PCR: Negative  Procedures: None  Consults: None  Subjective: Lying comfortably in bed-denies any chest pain or shortness of breath.  Appears frail/weak.  Objective: Vitals: Blood pressure (!) 169/67, pulse 77, temperature 98.5 F (36.9 C), temperature source Oral, resp. rate (!) 22, height 5' 11 (1.803 m), weight 80.4 kg, SpO2 90%.   Exam: Gen Exam:Alert awake-not in any distress HEENT:atraumatic, normocephalic Chest: B/L clear to auscultation anteriorly CVS:S1S2 regular Abdomen:soft non tender, non distended Extremities:no edema Neurology: Non focal Skin: no rash  Pertinent Labs/Radiology:    Latest Ref Rng & Units 08/26/2024    3:20 AM 08/25/2024    2:33 AM 08/24/2024    3:38 AM  CBC  WBC 4.0 - 10.5 K/uL 6.8  12.1  12.7   Hemoglobin 13.0 - 17.0 g/dL 85.8  85.8  84.5   Hematocrit 39.0 - 52.0 % 41.0  42.0  46.7   Platelets 150 - 400 K/uL 118  129  146     Lab Results  Component Value Date   NA 138 08/26/2024   K 3.6 08/26/2024   CL 103 08/26/2024   CO2 25 08/26/2024      Assessment/Plan: COVID-19 infection Stable on room air Chest x-ray without pneumonia Per prior notes-refused Remdesivir. Supportive care  Chronic diastolic/systolic heart failure Euvolemic As needed diuretics if patient  develops volume overload  CAD s/p CABG No anginal symptoms Continue aspirin /statin  Poor oral intake/failure to thrive syndrome Secondary to acute illness/COVID-19 infection Continue PT/OT eval-SNF recommended.  CKD stage IIIa At baseline  HLD Statin/Zetia   HTN BP stable Amlodipine .  Code status:   Code Status: Full Code   DVT Prophylaxis: enoxaparin  (LOVENOX ) injection 40 mg Start: 08/23/24 2130   Family Communication: None at bedside   Disposition Plan: Status is: Inpatient Remains inpatient appropriate because: Severity of illness   Planned Discharge Destination:Skilled nursing facility   Diet: Diet Order             Diet regular Room service appropriate? Yes with Assist; Fluid consistency: Thin  Diet effective now                     Antimicrobial agents: Anti-infectives (From admission, onward)    None        MEDICATIONS: Scheduled Meds:  amLODipine   10 mg Oral Daily   aspirin  EC  81 mg Oral Daily   enoxaparin  (LOVENOX ) injection  40 mg Subcutaneous Q24H   ezetimibe   10 mg Oral Daily   feeding supplement  1 Container Oral TID BM   feeding supplement  237 mL Oral TID BM   guaiFENesin   600 mg Oral BID   pravastatin   10 mg Oral QHS   Continuous Infusions: PRN Meds:.acetaminophen , acetaminophen , guaiFENesin -dextromethorphan , ipratropium-albuterol , melatonin, phenol,  polyethylene glycol, prochlorperazine    I have personally reviewed following labs and imaging studies  LABORATORY DATA: CBC: Recent Labs  Lab 08/23/24 1715 08/24/24 0338 08/25/24 0233 08/26/24 0320  WBC 8.2 12.7* 12.1* 6.8  NEUTROABS 7.1  --   --   --   HGB 15.6 15.4 14.1 14.1  HCT 47.2 46.7 42.0 41.0  MCV 94.6 94.7 92.7 91.7  PLT 145* 146* 129* 118*    Basic Metabolic Panel: Recent Labs  Lab 08/23/24 1715 08/24/24 0338 08/25/24 0233 08/26/24 0320  NA 137 137 136 138  K 4.1 4.1 3.6 3.6  CL 100 102 104 103  CO2 24 22 21* 25  GLUCOSE 129* 134* 116* 133*   BUN 19 21 25* 27*  CREATININE 1.27* 1.33* 1.11 1.19  CALCIUM 8.7* 8.5* 8.3* 8.3*  MG  --  1.8  --   --   PHOS  --  3.8  --   --     GFR: Estimated Creatinine Clearance: 47.5 mL/min (by C-G formula based on SCr of 1.19 mg/dL).  Liver Function Tests: Recent Labs  Lab 08/23/24 1715  AST 37  ALT 17  ALKPHOS 47  BILITOT 0.9  PROT 7.1  ALBUMIN 3.6   No results for input(s): LIPASE, AMYLASE in the last 168 hours. No results for input(s): AMMONIA in the last 168 hours.  Coagulation Profile: No results for input(s): INR, PROTIME in the last 168 hours.  Cardiac Enzymes: Recent Labs  Lab 08/23/24 2000  CKTOTAL 346    BNP (last 3 results) No results for input(s): PROBNP in the last 8760 hours.  Lipid Profile: No results for input(s): CHOL, HDL, LDLCALC, TRIG, CHOLHDL, LDLDIRECT in the last 72 hours.  Thyroid  Function Tests: No results for input(s): TSH, T4TOTAL, FREET4, T3FREE, THYROIDAB in the last 72 hours.  Anemia Panel: No results for input(s): VITAMINB12, FOLATE, FERRITIN, TIBC, IRON, RETICCTPCT in the last 72 hours.  Urine analysis:    Component Value Date/Time   COLORURINE YELLOW 09/13/2021 1431   APPEARANCEUR CLEAR 09/13/2021 1431   LABSPEC 1.013 09/13/2021 1431   PHURINE 6.0 09/13/2021 1431   GLUCOSEU NEGATIVE 09/13/2021 1431   HGBUR NEGATIVE 09/13/2021 1431   BILIRUBINUR NEGATIVE 09/13/2021 1431   BILIRUBINUR negative 09/12/2020 1122   KETONESUR NEGATIVE 09/13/2021 1431   PROTEINUR NEGATIVE 09/13/2021 1431   UROBILINOGEN 0.2 09/12/2020 1122   UROBILINOGEN 1.0 06/21/2010 1122   NITRITE NEGATIVE 09/13/2021 1431   LEUKOCYTESUR NEGATIVE 09/13/2021 1431    Sepsis Labs: Lactic Acid, Venous No results found for: LATICACIDVEN  MICROBIOLOGY: Recent Results (from the past 240 hours)  Resp panel by RT-PCR (RSV, Flu A&B, Covid) Anterior Nasal Swab     Status: Abnormal   Collection Time: 08/23/24  4:41 PM    Specimen: Anterior Nasal Swab  Result Value Ref Range Status   SARS Coronavirus 2 by RT PCR POSITIVE (A) NEGATIVE Final   Influenza A by PCR NEGATIVE NEGATIVE Final   Influenza B by PCR NEGATIVE NEGATIVE Final    Comment: (NOTE) The Xpert Xpress SARS-CoV-2/FLU/RSV plus assay is intended as an aid in the diagnosis of influenza from Nasopharyngeal swab specimens and should not be used as a sole basis for treatment. Nasal washings and aspirates are unacceptable for Xpert Xpress SARS-CoV-2/FLU/RSV testing.  Fact Sheet for Patients: BloggerCourse.com  Fact Sheet for Healthcare Providers: SeriousBroker.it  This test is not yet approved or cleared by the [place]  FDA and has been authorized for detection and/or diagnosis of SARS-CoV-2 by FDA under  an Emergency Use Authorization (EUA). This EUA will remain in effect (meaning this test can be used) for the duration of the COVID-19 declaration under Section 564(b)(1) of the Act, 21 U.S.C. section 360bbb-3(b)(1), unless the authorization is terminated or revoked.     Resp Syncytial Virus by PCR NEGATIVE NEGATIVE Final    Comment: (NOTE) Fact Sheet for Patients: BloggerCourse.com  Fact Sheet for Healthcare Providers: SeriousBroker.it  This test is not yet approved or cleared by the [place]  FDA and has been authorized for detection and/or diagnosis of SARS-CoV-2 by FDA under an Emergency Use Authorization (EUA). This EUA will remain in effect (meaning this test can be used) for the duration of the COVID-19 declaration under Section 564(b)(1) of the Act, 21 U.S.C. section 360bbb-3(b)(1), unless the authorization is terminated or revoked.  Performed at [facility], [address] [place], [place] [address]     RADIOLOGY STUDIES/RESULTS: No results found.   LOS: 2 days   Donalda Applebaum, MD  [facility]    To contact the attending provider between 7A-7P or the covering provider during after hours 7P-7A, please log into the web site [email or web address] and access using universal [place] password for that web site. If you do not have the password, please call the hospital operator.  08/26/2024, 9:56 AM

## 2024-08-26 NOTE — Plan of Care (Signed)

## 2024-08-27 ENCOUNTER — Inpatient Hospital Stay (HOSPITAL_COMMUNITY)

## 2024-08-27 DIAGNOSIS — U071 COVID-19: Secondary | ICD-10-CM | POA: Diagnosis not present

## 2024-08-27 DIAGNOSIS — R5383 Other fatigue: Secondary | ICD-10-CM | POA: Diagnosis not present

## 2024-08-27 DIAGNOSIS — W19XXXA Unspecified fall, initial encounter: Secondary | ICD-10-CM | POA: Diagnosis not present

## 2024-08-27 LAB — D-DIMER, QUANTITATIVE: D-Dimer, Quant: 1.02 ug{FEU}/mL — ABNORMAL HIGH (ref 0.00–0.50)

## 2024-08-27 LAB — BASIC METABOLIC PANEL WITH GFR
Anion gap: 8 (ref 5–15)
BUN: 27 mg/dL — ABNORMAL HIGH (ref 8–23)
CO2: 23 mmol/L (ref 22–32)
Calcium: 8 mg/dL — ABNORMAL LOW (ref 8.9–10.3)
Chloride: 105 mmol/L (ref 98–111)
Creatinine, Ser: 1.26 mg/dL — ABNORMAL HIGH (ref 0.61–1.24)
GFR, Estimated: 56 mL/min — ABNORMAL LOW (ref >60)
Glucose, Bld: 122 mg/dL — ABNORMAL HIGH (ref 70–99)
Potassium: 3.4 mmol/L — ABNORMAL LOW (ref 3.5–5.1)
Sodium: 136 mmol/L (ref 135–145)

## 2024-08-27 LAB — BRAIN NATRIURETIC PEPTIDE: B Natriuretic Peptide: 123.4 pg/mL — ABNORMAL HIGH (ref 0.0–100.0)

## 2024-08-27 LAB — C-REACTIVE PROTEIN: CRP: 13.1 mg/dL — ABNORMAL HIGH (ref <1.0)

## 2024-08-27 LAB — PROCALCITONIN: Procalcitonin: 3.54 ng/mL

## 2024-08-27 MED ORDER — IPRATROPIUM-ALBUTEROL 20-100 MCG/ACT IN AERS: 1.0000 | INHALATION_SPRAY | RESPIRATORY_TRACT | Status: DC | PRN

## 2024-08-27 MED ORDER — POTASSIUM CHLORIDE CRYS ER 20 MEQ PO TBCR
40.0000 meq | EXTENDED_RELEASE_TABLET | Freq: Once | ORAL | Status: AC
Start: 2024-08-27 — End: 2024-08-27
  Administered 2024-08-27: 40 meq via ORAL
  Filled 2024-08-27: qty 2

## 2024-08-27 NOTE — Progress Notes (Signed)
 Called to bedside for O2 sats dropping.  Patient on 6 L [employer] sat 90% changed to [place] HFNC at 6 L and sat increased to 94%.  RN will titrate up and down as needed.

## 2024-08-27 NOTE — Progress Notes (Addendum)
 PROGRESS NOTE        PATIENT DETAILS Name: Jose [name] Age: 88 y.o. Sex: male Date of Birth: 1936-02-08 Admit Date: 08/23/2024 Admitting Physician Terry LOISE Hurst, DO ERE:Emzcndu, [name], FNP  Brief Summary: Patient is a 88 y.o.  male with history of CAD s/p CABG, chronic combined CHF, HLD, HTN-who presented with weakness/fever/chills/poor oral intake-was found to have COVID-19 infection.  Significant events: 9/21>> admit to [facility]  Significant studies: 9/21>> CXR: No PNA 9/21>> CT head: No acute intracranial abnormality 9/21>> CT C-spine: No fracture  Significant microbiology data: 9/21>> COVID PCR: Positive 9/21>> influenza/RSV PCR: Negative  Procedures: None  Consults: None  Subjective: Requiring around 2-3 L of oxygen this morning-no complaints-does not feel good.  Not keen on going to rehab but acknowledges that he does not have much help from family as his wife is elderly and has health issues himself.  Objective: Vitals: Blood pressure (!) 124/95, pulse 74, temperature 98.3 F (36.8 C), temperature source Oral, resp. rate (!) 22, height 5' 11 (1.803 m), weight 80.4 kg, SpO2 94%.   Exam: Awake/alert-not in any distress Chest: Clear to auscultation-anteriorly CVS: S1-S2 regular Abdomen: Nontender nondistended Extremities: No edema Neurology: Nonfocal  Pertinent Labs/Radiology:    Latest Ref Rng & Units 08/26/2024    3:20 AM 08/25/2024    2:33 AM 08/24/2024    3:38 AM  CBC  WBC 4.0 - 10.5 K/uL 6.8  12.1  12.7   Hemoglobin 13.0 - 17.0 g/dL 85.8  85.8  84.5   Hematocrit 39.0 - 52.0 % 41.0  42.0  46.7   Platelets 150 - 400 K/uL 118  129  146     Lab Results  Component Value Date   NA 136 08/27/2024   K 3.4 (L) 08/27/2024   CL 105 08/27/2024   CO2 23 08/27/2024      Assessment/Plan: COVID-19 infection Relatively stable but requiring around 2-3 L of oxygen this morning-was on room air yesterday Repeat chest x-ray,  D-dimer/BNP today CRP downtrending-if chest x-ray confirms infiltrates-Will need to be started on steroids.  Chronic diastolic/systolic heart failure Euvolemic As needed diuretics if patient develops volume overload  CAD s/p CABG No anginal symptoms Continue aspirin /statin  Poor oral intake/failure to thrive syndrome Secondary to acute illness/COVID-19 infection Continue PT/OT eval-SNF recommended.  Patient not keen on going to SNF-family discussion in progress-as patient's significant other has medical issues of self and may not be able to provide extensive physical support that he may need.  CKD stage IIIa Close to baseline.  HLD Statin/Zetia   HTN BP stable Amlodipine .  Code status:   Code Status: Full Code   DVT Prophylaxis: enoxaparin  (LOVENOX ) injection 40 mg Start: 08/23/24 2130   Family Communication: At bedside-and spouse over the phone on 9/25   Disposition Plan: Status is: Inpatient Remains inpatient appropriate because: Severity of illness   Planned Discharge Destination:Skilled nursing facility   Diet: Diet Order             Diet regular Room service appropriate? Yes with Assist; Fluid consistency: Thin  Diet effective now                     Antimicrobial agents: Anti-infectives (From admission, onward)    None        MEDICATIONS: Scheduled Meds:  amLODipine   10 mg Oral Daily  aspirin  EC  81 mg Oral Daily   enoxaparin  (LOVENOX ) injection  40 mg Subcutaneous Q24H   ezetimibe   10 mg Oral Daily   feeding supplement  1 Container Oral TID BM   feeding supplement  237 mL Oral TID BM   guaiFENesin   600 mg Oral BID   pravastatin   10 mg Oral QHS   Continuous Infusions: PRN Meds:.acetaminophen , acetaminophen , guaiFENesin -dextromethorphan , Ipratropium-Albuterol , melatonin, phenol, polyethylene glycol, prochlorperazine    I have personally reviewed following labs and imaging studies  LABORATORY DATA: CBC: Recent Labs  Lab  08/23/24 1715 08/24/24 0338 08/25/24 0233 08/26/24 0320  WBC 8.2 12.7* 12.1* 6.8  NEUTROABS 7.1  --   --   --   HGB 15.6 15.4 14.1 14.1  HCT 47.2 46.7 42.0 41.0  MCV 94.6 94.7 92.7 91.7  PLT 145* 146* 129* 118*    Basic Metabolic Panel: Recent Labs  Lab 08/23/24 1715 08/24/24 0338 08/25/24 0233 08/26/24 0320 08/27/24 0356  NA 137 137 136 138 136  K 4.1 4.1 3.6 3.6 3.4*  CL 100 102 104 103 105  CO2 24 22 21* 25 23  GLUCOSE 129* 134* 116* 133* 122*  BUN 19 21 25* 27* 27*  CREATININE 1.27* 1.33* 1.11 1.19 1.26*  CALCIUM 8.7* 8.5* 8.3* 8.3* 8.0*  MG  --  1.8  --   --   --   PHOS  --  3.8  --   --   --     GFR: Estimated Creatinine Clearance: 44.8 mL/min (A) (by C-G formula based on SCr of 1.26 mg/dL (H)).  Liver Function Tests: Recent Labs  Lab 08/23/24 1715  AST 37  ALT 17  ALKPHOS 47  BILITOT 0.9  PROT 7.1  ALBUMIN 3.6   No results for input(s): LIPASE, AMYLASE in the last 168 hours. No results for input(s): AMMONIA in the last 168 hours.  Coagulation Profile: No results for input(s): INR, PROTIME in the last 168 hours.  Cardiac Enzymes: Recent Labs  Lab 08/23/24 2000  CKTOTAL 346    BNP (last 3 results) No results for input(s): PROBNP in the last 8760 hours.  Lipid Profile: No results for input(s): CHOL, HDL, LDLCALC, TRIG, CHOLHDL, LDLDIRECT in the last 72 hours.  Thyroid  Function Tests: No results for input(s): TSH, T4TOTAL, FREET4, T3FREE, THYROIDAB in the last 72 hours.  Anemia Panel: No results for input(s): VITAMINB12, FOLATE, FERRITIN, TIBC, IRON, RETICCTPCT in the last 72 hours.  Urine analysis:    Component Value Date/Time   COLORURINE YELLOW 09/13/2021 1431   APPEARANCEUR CLEAR 09/13/2021 1431   LABSPEC 1.013 09/13/2021 1431   PHURINE 6.0 09/13/2021 1431   GLUCOSEU NEGATIVE 09/13/2021 1431   HGBUR NEGATIVE 09/13/2021 1431   BILIRUBINUR NEGATIVE 09/13/2021 1431   BILIRUBINUR  negative 09/12/2020 1122   KETONESUR NEGATIVE 09/13/2021 1431   PROTEINUR NEGATIVE 09/13/2021 1431   UROBILINOGEN 0.2 09/12/2020 1122   UROBILINOGEN 1.0 06/21/2010 1122   NITRITE NEGATIVE 09/13/2021 1431   LEUKOCYTESUR NEGATIVE 09/13/2021 1431    Sepsis Labs: Lactic Acid, Venous No results found for: LATICACIDVEN  MICROBIOLOGY: Recent Results (from the past 240 hours)  Resp panel by RT-PCR (RSV, Flu A&B, Covid) Anterior Nasal Swab     Status: Abnormal   Collection Time: 08/23/24  4:41 PM   Specimen: Anterior Nasal Swab  Result Value Ref Range Status   SARS Coronavirus 2 by RT PCR POSITIVE (A) NEGATIVE Final   Influenza A by PCR NEGATIVE NEGATIVE Final   Influenza B by PCR  NEGATIVE NEGATIVE Final    Comment: (NOTE) The Xpert Xpress SARS-CoV-2/FLU/RSV plus assay is intended as an aid in the diagnosis of influenza from Nasopharyngeal swab specimens and should not be used as a sole basis for treatment. Nasal washings and aspirates are unacceptable for Xpert Xpress SARS-CoV-2/FLU/RSV testing.  Fact Sheet for Patients: BloggerCourse.com  Fact Sheet for Healthcare Providers: SeriousBroker.it  This test is not yet approved or cleared by the [place]  FDA and has been authorized for detection and/or diagnosis of SARS-CoV-2 by FDA under an Emergency Use Authorization (EUA). This EUA will remain in effect (meaning this test can be used) for the duration of the COVID-19 declaration under Section 564(b)(1) of the Act, 21 U.S.C. section 360bbb-3(b)(1), unless the authorization is terminated or revoked.     Resp Syncytial Virus by PCR NEGATIVE NEGATIVE Final    Comment: (NOTE) Fact Sheet for Patients: BloggerCourse.com  Fact Sheet for Healthcare Providers: SeriousBroker.it  This test is not yet approved or cleared by the [place]  FDA and has been authorized for detection  and/or diagnosis of SARS-CoV-2 by FDA under an Emergency Use Authorization (EUA). This EUA will remain in effect (meaning this test can be used) for the duration of the COVID-19 declaration under Section 564(b)(1) of the Act, 21 U.S.C. section 360bbb-3(b)(1), unless the authorization is terminated or revoked.  Performed at [facility], [address], [place], [place] [address]     RADIOLOGY STUDIES/RESULTS: No results found.   LOS: 3 days   Donalda Applebaum, MD  [facility]    To contact the attending provider between 7A-7P or the covering provider during after hours 7P-7A, please log into the web site [email or web address] and access using universal [place] password for that web site. If you do not have the password, please call the hospital operator.  08/27/2024, 11:55 AM

## 2024-08-27 NOTE — Progress Notes (Signed)
 Physical Therapy Treatment Patient Details Name: Jose [name] MRN: [id number] DOB: [birth year] Today's Date: 08/27/2024   History of Present Illness 88 y.o. male presents to [facility] hospital on [facility]24 with generalized weakness, found to be positive for COVID. PMH includes CABG, HLD, HTN, CHF.    PT Comments  Pt received in bed, on 2L HFNC and per daughter was not on O2 at home. SPO2 initially in mid 90's but dropped to 87-88% with mobility on O2 and 84% with mobility on RA. Pt denied SOB or dizziness but was noted to be less responsive and verbal when O2 dropped. He also had difficulty standing and gaining balance in standing. Performed STS numerous times and pregait and pt could not get steady on his feet. In addition his BP dropped from 124/95 in supine to 117/72 and then 107/71 3 mins later. Pt seemed to have several periods of zoning out where he did not answer questions and then would engage again. Daughter reports she has seen some of that at home recently too. Pt needing mod A for mobility today and family will not be able to provide this at home. Patient will benefit from continued inpatient follow up therapy, <3 hours/day. PT will continue to follow.     If plan is discharge home, recommend the following: Assist for transportation;A lot of help with walking and/or transfers;Help with stairs or ramp for entrance;Assistance with cooking/housework;A lot of help with bathing/dressing/bathroom   Can travel by private vehicle     Yes  Equipment Recommendations  BSC/3in1    Recommendations for Other Services       Precautions / Restrictions Precautions Precautions: Fall Recall of Precautions/Restrictions: Impaired Precaution/Restrictions Comments: COVID+ Restrictions Weight Bearing Restrictions Per Provider Order: No     Mobility  Bed Mobility Overal bed mobility: Needs Assistance Bed Mobility: Supine to Sit, Sit to Supine     Supine to sit: Supervision, Used rails, HOB  elevated Sit to supine: Supervision, Used rails   General bed mobility comments: increased time, cues for using rail. With return to supine pt laid trunk onto bed and just left LE's out and was not really aware that he wasn't all the way back into bed. When cued he was able to bring LEs into bed without assist.    Transfers Overall transfer level: Needs assistance Equipment used: 1 person hand held assist Transfers: Sit to/from Stand Sit to Stand: Mod assist           General transfer comment: posterior bias mod A for coming forward and for power up. Had difficulty with initiation. Pt stands with wide stance and had difficulty wt shifting to bring LE's closer together    Ambulation/Gait             Pre-gait activities: began stepping in place without O2 and pt with desaturation to 84%, denied SOB and dizziness but he did not seem to feel well. Pt then performed pregait activities again with 2L HFNC and SPO2 87-88% but pt still very unsteady and with noted BP drop from 124/95 in supine to 117/72 in standing General Gait Details: deferred due to O2 desat on and off O2 as well as BP drop in standing   Stairs             Wheelchair Mobility     Tilt Bed    Modified Rankin (Stroke Patients Only)       Balance Overall balance assessment: Needs assistance Sitting-balance support: Feet supported Sitting balance-Leahy Scale: Fair  Sitting balance - Comments: able to don shoes sitting EOB   Standing balance support: Bilateral upper extremity supported Standing balance-Leahy Scale: Poor Standing balance comment: UE support for balance and min A                            Communication Communication Communication: Impaired Factors Affecting Communication: Hearing impaired  Cognition Arousal: Alert Behavior During Therapy: WFL for tasks assessed/performed   PT - Cognitive impairments: No family/caregiver present to determine baseline, Safety/Judgement,  Problem solving, Attention, Initiation, Sequencing                       PT - Cognition Comments: pt's daughter present and reports that she has seen some mild signs of dementia at home PTA but current status is significantly different than his baseline. He has short periods where he doesn't respond directly to questions and seems to zone out. It happened more when he was sitting and standing than when he was in supine at rest. Pt with minimal insight into limitations Following commands: Impaired Following commands impaired: Only follows one step commands consistently, Follows multi-step commands inconsistently, Follows one step commands with increased time    Cueing Cueing Techniques: Verbal cues  Exercises      General Comments General comments (skin integrity, edema, etc.): daughter present      Pertinent Vitals/Pain Pain Assessment Pain Assessment: No/denies pain    Home Living                          Prior Function            PT Goals (current goals can now be found in the care plan section) Acute Rehab PT Goals Patient Stated Goal: to reduce falls risk PT Goal Formulation: With patient Time For Goal Achievement: 09/07/24 Potential to Achieve Goals: Fair Progress towards PT goals: Not progressing toward goals - comment (SPO2)    Frequency    Min 2X/week      PT Plan      Co-evaluation              AM-PAC PT 6 Clicks Mobility   Outcome Measure  Help needed turning from your back to your side while in a flat bed without using bedrails?: A Little Help needed moving from lying on your back to sitting on the side of a flat bed without using bedrails?: A Little Help needed moving to and from a bed to a chair (including a wheelchair)?: A Little Help needed standing up from a chair using your arms (e.g., wheelchair or bedside chair)?: A Lot Help needed to walk in hospital room?: Total Help needed climbing 3-5 steps with a railing? :  Total 6 Click Score: 13    End of Session Equipment Utilized During Treatment: Gait belt Activity Tolerance: Treatment limited secondary to medical complications (Comment) (decr O2 and BP) Patient left: in bed;with call bell/phone within reach;with bed alarm set;with family/visitor present Nurse Communication: Mobility status PT Visit Diagnosis: Other abnormalities of gait and mobility (R26.89);Muscle weakness (generalized) (M62.81);History of falling (Z91.81)     Time: 1122-1208 PT Time Calculation (min) (ACUTE ONLY): 46 min  Charges:    $Gait Training: 8-22 mins $Therapeutic Activity: 23-37 mins PT General Charges $$ ACUTE PT VISIT: 1 Visit  Jose [name], PT  Acute Rehab Services Secure chat preferred Office [phone]    Jose CROME Markitta Ausburn 08/27/2024, 1:12 PM

## 2024-08-27 NOTE — Plan of Care (Signed)
  Problem: Education: Goal: Knowledge of General Education information will improve Description: Including pain rating scale, medication(s)/side effects and non-pharmacologic comfort measures Outcome: Progressing   Problem: Health Behavior/Discharge Planning: Goal: Ability to manage health-related needs will improve Outcome: Progressing   Problem: Clinical Measurements: Goal: Ability to maintain clinical measurements within normal limits will improve Outcome: Progressing   Problem: Skin Integrity: Goal: Risk for impaired skin integrity will decrease Outcome: Progressing   Problem: Activity: Goal: Risk for activity intolerance will decrease Outcome: Not Progressing   Problem: Nutrition: Goal: Adequate nutrition will be maintained Outcome: Not Progressing

## 2024-08-28 ENCOUNTER — Inpatient Hospital Stay (HOSPITAL_COMMUNITY)

## 2024-08-28 ENCOUNTER — Encounter (HOSPITAL_COMMUNITY)

## 2024-08-28 DIAGNOSIS — R0603 Acute respiratory distress: Secondary | ICD-10-CM

## 2024-08-28 DIAGNOSIS — U071 COVID-19: Secondary | ICD-10-CM | POA: Diagnosis not present

## 2024-08-28 DIAGNOSIS — R7989 Other specified abnormal findings of blood chemistry: Secondary | ICD-10-CM | POA: Diagnosis not present

## 2024-08-28 LAB — CBC
HCT: 44.3 % (ref 39.0–52.0)
Hemoglobin: 15.1 g/dL (ref 13.0–17.0)
MCH: 30.9 pg (ref 26.0–34.0)
MCHC: 34.1 g/dL (ref 30.0–36.0)
MCV: 90.8 fL (ref 80.0–100.0)
Platelets: 153 K/uL (ref 150–400)
RBC: 4.88 MIL/uL (ref 4.22–5.81)
RDW: 12.2 % (ref 11.5–15.5)
WBC: 4.7 K/uL (ref 4.0–10.5)
nRBC: 0 % (ref 0.0–0.2)

## 2024-08-28 LAB — BASIC METABOLIC PANEL WITH GFR
Anion gap: 13 (ref 5–15)
BUN: 21 mg/dL (ref 8–23)
CO2: 21 mmol/L — ABNORMAL LOW (ref 22–32)
Calcium: 8.2 mg/dL — ABNORMAL LOW (ref 8.9–10.3)
Chloride: 101 mmol/L (ref 98–111)
Creatinine, Ser: 0.94 mg/dL (ref 0.61–1.24)
GFR, Estimated: >60 mL/min (ref >60)
Glucose, Bld: 113 mg/dL — ABNORMAL HIGH (ref 70–99)
Potassium: 3.4 mmol/L — ABNORMAL LOW (ref 3.5–5.1)
Sodium: 135 mmol/L (ref 135–145)

## 2024-08-28 LAB — ECHOCARDIOGRAM COMPLETE
Height: 71 in
P 1/2 time: 690 ms
S' Lateral: 4.4 cm
Weight: 2821.89 [oz_av]

## 2024-08-28 LAB — C-REACTIVE PROTEIN: CRP: 8.8 mg/dL — ABNORMAL HIGH (ref <1.0)

## 2024-08-28 MED ORDER — SODIUM CHLORIDE 0.9 % IV SOLN
500.0000 mg | INTRAVENOUS | Status: AC
  Administered 2024-08-28 – 2024-08-30 (×3): 500 mg via INTRAVENOUS
  Filled 2024-08-28 (×3): qty 5

## 2024-08-28 MED ORDER — METHYLPREDNISOLONE SODIUM SUCC 125 MG IJ SOLR
80.0000 mg | INTRAMUSCULAR | Status: DC
  Administered 2024-08-28 – 2024-08-30 (×3): 80 mg via INTRAVENOUS
  Filled 2024-08-28 (×3): qty 2

## 2024-08-28 MED ORDER — POTASSIUM CHLORIDE CRYS ER 20 MEQ PO TBCR
40.0000 meq | EXTENDED_RELEASE_TABLET | Freq: Four times a day (QID) | ORAL | Status: AC
  Administered 2024-08-28 (×2): 40 meq via ORAL
  Filled 2024-08-28 (×2): qty 2

## 2024-08-28 MED ORDER — SODIUM CHLORIDE 0.9 % IV SOLN
3.0000 g | Freq: Four times a day (QID) | INTRAVENOUS | Status: AC
  Administered 2024-08-28 – 2024-09-02 (×20): 3 g via INTRAVENOUS
  Filled 2024-08-28 (×19): qty 8

## 2024-08-28 MED ORDER — FUROSEMIDE 10 MG/ML IJ SOLN
40.0000 mg | Freq: Once | INTRAMUSCULAR | Status: AC
  Administered 2024-08-28: 40 mg via INTRAVENOUS
  Filled 2024-08-28: qty 4

## 2024-08-28 NOTE — Progress Notes (Signed)
  Echocardiogram 2D Echocardiogram has been performed.  Jose [name] 08/28/2024, 3:47 PM

## 2024-08-28 NOTE — Plan of Care (Signed)
  Problem: Education: Goal: Knowledge of General Education information will improve Description: Including pain rating scale, medication(s)/side effects and non-pharmacologic comfort measures Outcome: Progressing   Problem: Clinical Measurements: Goal: Ability to maintain clinical measurements within normal limits will improve Outcome: Progressing   Problem: Activity: Goal: Risk for activity intolerance will decrease Outcome: Progressing   Problem: Nutrition: Goal: Adequate nutrition will be maintained Outcome: Progressing   Problem: Safety: Goal: Ability to remain free from injury will improve Outcome: Progressing   

## 2024-08-28 NOTE — Evaluation (Signed)
 Clinical/Bedside Swallow Evaluation Patient Details  Name: Jose [name] MRN: [id number] Date of Birth: 1936/04/04  Today's Date: 08/28/2024 Time: SLP Start Time (ACUTE ONLY): 1145 SLP Stop Time (ACUTE ONLY): 1202 SLP Time Calculation (min) (ACUTE ONLY): 17 min  Past Medical History:  Past Medical History:  Diagnosis Date   Complication of anesthesia    hard time waking up from it alot of times (10/09/2013)   Coronary artery disease    hx CABG 2011   GERD (gastroesophageal reflux disease)    not in >30 yr (10/09/2013)   Gout    H/O cardiovascular stress test 2011     mild to mod ischemia this led to cath and CABG   Hx of echocardiogram 2010   EF >55%, mild to mod MR, mild aortic reg.    Hyperlipidemia LDL goal <70    Hypertension    Kidney stone    Myocardial infarction [facility]) 1999; 2006   both acute inferior wall   Skin cancer    right nose, lower lip, right ankle, left side (10/09/2013)   Ventricular fibrillation (HCC) 2006   arrest with MI   Past Surgical History:  Past Surgical History:  Procedure Laterality Date   BACK SURGERY     CORONARY ANGIOPLASTY WITH STENT PLACEMENT  1999   Stent to RCA   CORONARY ANGIOPLASTY WITH STENT PLACEMENT  2006   stent to distal RCA   CORONARY ANGIOPLASTY WITH STENT PLACEMENT  2006   mid LAD with Cypher stent   CORONARY ARTERY BYPASS GRAFT  2011   LIMA-LAD; seq VG-1st & 2nd OM of LCX; VG-PDA of RCA   CYSTOSCOPY     had alot of laser for kidney stones (10/09/2013)   CYSTOSCOPY W/ STONE MANIPULATION  12/2003   I & D EXTREMITY  06/18/2012   Procedure: IRRIGATION AND DEBRIDEMENT EXTREMITY;  Surgeon: Prentice LELON Pagan, MD;  Location: [facility] OR;  Service: Orthopedics;  Laterality: Left;  I & D left thumb    LITHOTRIPSY     several times (10/09/2013)   LUMBAR DISC SURGERY  1998   disc ruptured (10/09/2013)   SKIN CANCER EXCISION     right nose, lower lip; right ankle, left side (10/09/2013)   HPI:  88yo male admitted 08/23/24 with  generalized weakness and fall, low grade fever. PMH: CAD s/p CABG (2011), HLD, HTN, CHF, GERD, MI. CXR 08/27/24 - New heterogeneous alveolar and interstitial opacities overlying the right lung with asymmetric more involvement of the lateral  aspect of the right mid lung zone, favored to represent multilobar pneumonia. New CXR pending    Assessment / Plan / Recommendation  Clinical Impression  SLP reconsulted for BSE given increased O2 requirement and PNA per CXR. Pt and RN report no s/s aspiration, and no difficulty swallowing. Pt accepted trials of thin liquid, puree, and solid textures. Timely oral prep and clearing noted. No overt s/s aspiration observed given multiple presentations of all textures. Pt passed [facility] swallow screen, consuming 3oz water via straw without immediate or delayed overt s/s aspiration, indicating low likelihood of aspiration.   Pt advanced age is his sole high risk indicator for dysphagia. Anticipate current PNA related to Covid infection, rather than oropharyngeal dysphagia. However, silent aspiration cannot be determined at bedside. If silent aspiration is suspected, recommend instrumental swallow assessment. Recommend continuing with current (regular/thin liquid) diet. MD/RN notified of results and recommendations. ST signing off at this time.  SLP Visit Diagnosis: Dysphagia, unspecified (R13.10)    Aspiration Risk  Mild aspiration risk    Diet Recommendation Regular;Thin liquid    Liquid Administration via: Cup;Straw Medication Administration: Other (Comment) (as tolerated) Supervision: Patient able to self feed Compensations: Minimize environmental distractions;Slow rate;Small sips/bites;Other (Comment) Postural Changes: Seated upright at 90 degrees;Remain upright for at least 30 minutes after po intake    Other  Recommendations Oral Care Recommendations: Oral care BID         Prognosis Prognosis for improved oropharyngeal function: Good Barriers to Reach  Goals: Cognitive deficits      Swallow Study   General Date of Onset: 08/23/24 HPI: 88yo male admitted 08/23/24 with generalized weakness and fall, low grade fever. PMH: CAD s/p CABG (2011), HLD, HTN, CHF, GERD, MI. CXR 08/27/24 - New heterogeneous alveolar and interstitial opacities overlying the right lung with asymmetric more involvement of the lateral  aspect of the right mid lung zone, favored to represent multilobar pneumonia. New CXR pending Type of Study: Bedside Swallow Evaluation Previous Swallow Assessment: BSE 08/24/24, adv to reg/thin and DC'd. Pt now with increased O2 requirement and PNA Diet Prior to this Study: Regular;Thin liquids (Level 0) Temperature Spikes Noted: No Respiratory Status: Nasal cannula History of Recent Intubation: No Behavior/Cognition: Alert;Cooperative;Requires cueing [facility]) Oral Cavity Assessment: Within Functional Limits Oral Care Completed by SLP: Yes Oral Cavity - Dentition: Missing dentition;Adequate natural dentition Vision: Functional for self-feeding Self-Feeding Abilities: Able to feed self Patient Positioning: Upright in bed Baseline Vocal Quality: Low vocal intensity Volitional Cough: Strong Volitional Swallow: Able to elicit    Oral/Motor/Sensory Function Overall Oral Motor/Sensory Function: Within functional limits   Ice Chips Ice chips: Not tested   Thin Liquid Thin Liquid: Within functional limits Presentation: Cup;Straw Pharyngeal  Phase Impairments: Other (comments) (none\)    Nectar Thick Nectar Thick Liquid: Not tested   Honey Thick Honey Thick Liquid: Not tested   Puree Puree: Within functional limits Presentation: Spoon Pharyngeal Phase Impairments: Other (comments) (none)   Solid     Solid: Within functional limits Pharyngeal Phase Impairments: Other (comments) (none)     Jose [name], MSP, CCC-SLP Speech Language Pathologist Office: [phone]  Jose [name] 08/28/2024,12:10 PM

## 2024-08-28 NOTE — NC FL2 (Signed)
 [place]  MEDICAID FL2 LEVEL OF CARE FORM     IDENTIFICATION  Patient Name: Jose [name] Birthdate: 12-17-1935 Sex: male Admission Date (Current Location): 08/23/2024  [facility] and [place] Number:  Producer, television/film/video and Address:  [facility], [address] [place], [place] [address]      Provider Number: [id number]  Attending Physician Name and Address:  Raenelle Donalda HERO, MD  Relative Name and Phone Number:  Christohper, Dube (Daughter)  [phone]    Current Level of Care: Hospital Recommended Level of Care: Skilled Nursing Facility Prior Approval Number:    Date Approved/Denied:   PASRR Number: [id number]  Discharge Plan: SNF    Current Diagnoses: Patient Active Problem List   Diagnosis Date Noted   Elevated serum creatinine 08/24/2024   Fall 08/24/2024   Fatigue 08/24/2024   Oropharyngeal dysphagia 08/24/2024   COVID-19 08/24/2024   COVID-19 virus infection 08/23/2024   LBBB (left bundle branch block) 08/10/2020   Chest pain 10/09/2013   HTN (hypertension) 10/09/2013   Coronary artery disease, hx of two inf. MIs one with v. fib arrest, stents to RCA and ultimately in 2011 CABG     Hx of myocardial infarction    Hyperlipidemia LDL goal <70    Hx of ventricular fibrillation     Orientation RESPIRATION BLADDER Height & Weight     Self, Place  O2 (6L [place] (potential to ween)) Incontinent, External catheter Weight: 176 lb 5.9 oz (80 kg) Height:  5' 11 (180.3 cm)  BEHAVIORAL SYMPTOMS/MOOD NEUROLOGICAL BOWEL NUTRITION STATUS      Continent Diet  AMBULATORY STATUS COMMUNICATION OF NEEDS Skin   Extensive Assist Verbally Normal                       Personal Care Assistance Level of Assistance  Bathing, Feeding, Dressing Bathing Assistance: Limited assistance Feeding assistance: Limited assistance Dressing Assistance: Limited assistance     Functional Limitations Info  Sight, Hearing, Speech Sight Info: Adequate Hearing  Info: Impaired Speech Info: Adequate    SPECIAL CARE FACTORS FREQUENCY  OT (By licensed OT), PT (By licensed PT)     PT Frequency: 5x week OT Frequency: 5x week            Contractures Contractures Info: Not present    Additional Factors Info  Code Status, Allergies, Isolation Precautions Code Status Info: Full Allergies Info: [place] Venom (Bee Venom), Benicar (Olmesartan), Cozaar (Losartan), Plavix (Clopidogrel), Relafen (Nabumetone), Statins, Tekturna (Aliskiren), Atorvastatin, Ramipril     Isolation Precautions Info: Air/Con pre     Current Medications (08/28/2024):  This is the current hospital active medication list Current Facility-Administered Medications  Medication Dose Route Frequency Provider Last Rate Last Admin   acetaminophen  (TYLENOL ) suppository 650 mg  650 mg Rectal Q4H PRN Hall, Carole N, DO       acetaminophen  (TYLENOL ) tablet 650 mg  650 mg Oral Q6H PRN Shona Laurence N, DO   325 mg at 08/24/24 0347   amLODipine  (NORVASC ) tablet 10 mg  10 mg Oral Daily Shona Laurence N, DO   10 mg at 08/28/24 1038   Ampicillin -Sulbactam (UNASYN ) 3 g in sodium chloride  0.9 % 100 mL IVPB  3 g Intravenous Q6H Ghimire, Shanker M, MD 200 mL/hr at 08/28/24 0830 3 g at 08/28/24 0830   aspirin  EC tablet 81 mg  81 mg Oral Daily Shona Laurence N, DO   81 mg at 08/28/24 1112   azithromycin  (ZITHROMAX ) 500  mg in sodium chloride  0.9 % 250 mL IVPB  500 mg Intravenous Q24H Raenelle Donalda HERO, MD 250 mL/hr at 08/28/24 0830 500 mg at 08/28/24 0830   enoxaparin  (LOVENOX ) injection 40 mg  40 mg Subcutaneous Q24H Shona Laurence N, DO   40 mg at 08/27/24 2124   ezetimibe  (ZETIA ) tablet 10 mg  10 mg Oral Daily Shona Laurence N, DO   10 mg at 08/28/24 1038   feeding supplement (BOOST / RESOURCE BREEZE) liquid 1 Container  1 Container Oral TID BM Sreeram, Narendranath, MD   1 Container at 08/28/24 1040   feeding supplement (ENSURE PLUS HIGH PROTEIN) liquid 237 mL  237 mL Oral TID BM Elgergawy, Dawood S,  MD   237 mL at 08/28/24 1040   guaiFENesin  (MUCINEX ) 12 hr tablet 600 mg  600 mg Oral BID Elgergawy, Dawood S, MD   600 mg at 08/28/24 1038   guaiFENesin -dextromethorphan  (ROBITUSSIN DM) 100-10 MG/5ML syrup 5 mL  5 mL Oral Q4H PRN Hall, Carole N, DO   5 mL at 08/24/24 2208   Ipratropium-Albuterol  (COMBIVENT) respimat 1 puff  1 puff Inhalation Q2H PRN Ghimire, Donalda HERO, MD       melatonin tablet 5 mg  5 mg Oral QHS PRN Shona Laurence N, DO   5 mg at 08/24/24 2208   methylPREDNISolone  sodium succinate (SOLU-MEDROL ) 125 mg/2 mL injection 80 mg  80 mg Intravenous Q24H Raenelle Donalda HERO, MD   80 mg at 08/28/24 1021   phenol (CHLORASEPTIC) mouth spray 1 spray  1 spray Mouth/Throat PRN Shona Laurence N, DO       polyethylene glycol (MIRALAX  / GLYCOLAX ) packet 17 g  17 g Oral Daily PRN Shona Laurence N, DO       potassium chloride  SA (KLOR-CON  M) CR tablet 40 mEq  40 mEq Oral Q6H Ghimire, Shanker M, MD   40 mEq at 08/28/24 1036   pravastatin  (PRAVACHOL ) tablet 10 mg  10 mg Oral QHS Shona Laurence N, DO   10 mg at 08/27/24 2124   prochlorperazine  (COMPAZINE ) injection 5 mg  5 mg Intravenous Q6H PRN Shona Laurence SAILOR, DO         Discharge Medications: Please see discharge summary for a list of discharge medications.  Relevant Imaging Results:  Relevant Lab Results:   Additional Information SSN [id number]  Luise JAYSON Pan, LCSWA

## 2024-08-28 NOTE — Progress Notes (Signed)
 PROGRESS NOTE        PATIENT DETAILS Name: Jose [name] Age: 88 y.o. Sex: male Date of Birth: Dec 16, 1935 Admit Date: 08/23/2024 Admitting Physician Terry LOISE Hurst, DO ERE:Emzcndu, [name], FNP  Brief Summary: Patient is a 88 y.o.  male with history of CAD s/p CABG, chronic combined CHF, HLD, HTN-who presented with weakness/fever/chills/poor oral intake-was found to have COVID-19 infection.  Significant events: 9/21>> admit to [facility] 9/26>> steroids/antibiotics started-chest x-ray with multifocal pneumonia-requiring around 4-5 L of oxygen.  Significant studies: 9/21>> CXR: No PNA 9/21>> CT head: No acute intracranial abnormality 9/21>> CT C-spine: No fracture 9/25>> CXR: Multifocal PNA  Significant microbiology data: 9/21>> COVID PCR: Positive 9/21>> influenza/RSV PCR: Negative  Procedures: None  Consults: None  Subjective: Comfortable-but on 4-5 L of oxygen.  No other issues overnight.  Objective: Vitals: Blood pressure 128/69, pulse 67, temperature 98.2 F (36.8 C), temperature source Oral, resp. rate (!) 22, height 5' 11 (1.803 m), weight 80 kg, SpO2 91%.   Exam: Awake/alert Chest: Clear to auscultation CVS: S1-S2 regular Abdomen: Soft nontender nondistended Extremities: No edema.  Pertinent Labs/Radiology:    Latest Ref Rng & Units 08/28/2024    6:01 AM 08/26/2024    3:20 AM 08/25/2024    2:33 AM  CBC  WBC 4.0 - 10.5 K/uL 4.7  6.8  12.1   Hemoglobin 13.0 - 17.0 g/dL 84.8  85.8  85.8   Hematocrit 39.0 - 52.0 % 44.3  41.0  42.0   Platelets 150 - 400 K/uL 153  118  129     Lab Results  Component Value Date   NA 135 08/28/2024   K 3.4 (L) 08/28/2024   CL 101 08/28/2024   CO2 21 (L) 08/28/2024     Assessment/Plan: Acute hypoxic respiratory failure secondary to COVID-19 pneumonia-+/bacterial PNA  Now on 4-5 L of HFNC CRP downtrending-procalcitonin elevated Starting steroid/empiric Unasyn /Zithromax  Will send out blood  cultures. Although no signs of volume overload-Will give 1 dose of IV Lasix  to see if this improves hypoxemia D-dimer minimally elevated-Will check Doppler-watch closely-if hypoxia worsens-May need CTA (has been on prophylactic Lovenox ). SLP eval-to ensure no overt aspiration issues. Incentive spirometry/flutter valve Mobilize daily-out of bed to chair.  Chronic diastolic/systolic heart failure Euvolemic-but will give 1 dose of IV Lasix  to ensure negative balance Check updated echocardiogram.  CAD s/p CABG No anginal symptoms Continue aspirin /statin  Poor oral intake/failure to thrive syndrome Secondary to acute illness/COVID-19 infection Continue PT/OT eval-SNF recommended.    CKD stage IIIa Close to baseline.  HLD Statin/Zetia   HTN BP stable Amlodipine .  Code status:   Code Status: Full Code   DVT Prophylaxis: enoxaparin  (LOVENOX ) injection 40 mg Start: 08/23/24 2130   Family Communication: Daughter-[name]-[phone] -updated over the phone 9/26   Disposition Plan: Status is: Inpatient Remains inpatient appropriate because: Severity of illness   Planned Discharge Destination:Skilled nursing facility   Diet: Diet Order             Diet regular Room service appropriate? Yes with Assist; Fluid consistency: Thin  Diet effective now                     Antimicrobial agents: Anti-infectives (From admission, onward)    Start     Dose/Rate Route Frequency Ordered Stop   08/28/24 0800  azithromycin  (ZITHROMAX ) 500 mg in sodium  chloride 0.9 % 250 mL IVPB        500 mg 250 mL/hr over 60 Minutes Intravenous Every 24 hours 08/28/24 0653 08/31/24 0759   08/28/24 0800  Ampicillin -Sulbactam (UNASYN ) 3 g in sodium chloride  0.9 % 100 mL IVPB        3 g 200 mL/hr over 30 Minutes Intravenous Every 6 hours 08/28/24 0659 09/02/24 0759        MEDICATIONS: Scheduled Meds:  amLODipine   10 mg Oral Daily   aspirin  EC  81 mg Oral Daily   enoxaparin  (LOVENOX )  injection  40 mg Subcutaneous Q24H   ezetimibe   10 mg Oral Daily   feeding supplement  1 Container Oral TID BM   feeding supplement  237 mL Oral TID BM   furosemide   40 mg Intravenous Once   guaiFENesin   600 mg Oral BID   methylPREDNISolone  (SOLU-MEDROL ) injection  80 mg Intravenous Q24H   potassium chloride   40 mEq Oral Q6H   pravastatin   10 mg Oral QHS   Continuous Infusions:  ampicillin -sulbactam (UNASYN ) IV     azithromycin      PRN Meds:.acetaminophen , acetaminophen , guaiFENesin -dextromethorphan , Ipratropium-Albuterol , melatonin, phenol, polyethylene glycol, prochlorperazine    I have personally reviewed following labs and imaging studies  LABORATORY DATA: CBC: Recent Labs  Lab 08/23/24 1715 08/24/24 0338 08/25/24 0233 08/26/24 0320 08/28/24 0601  WBC 8.2 12.7* 12.1* 6.8 4.7  NEUTROABS 7.1  --   --   --   --   HGB 15.6 15.4 14.1 14.1 15.1  HCT 47.2 46.7 42.0 41.0 44.3  MCV 94.6 94.7 92.7 91.7 90.8  PLT 145* 146* 129* 118* 153    Basic Metabolic Panel: Recent Labs  Lab 08/24/24 0338 08/25/24 0233 08/26/24 0320 08/27/24 0356 08/28/24 0601  NA 137 136 138 136 135  K 4.1 3.6 3.6 3.4* 3.4*  CL 102 104 103 105 101  CO2 22 21* 25 23 21*  GLUCOSE 134* 116* 133* 122* 113*  BUN 21 25* 27* 27* 21  CREATININE 1.33* 1.11 1.19 1.26* 0.94  CALCIUM 8.5* 8.3* 8.3* 8.0* 8.2*  MG 1.8  --   --   --   --   PHOS 3.8  --   --   --   --     GFR: Estimated Creatinine Clearance: 60.1 mL/min (by C-G formula based on SCr of 0.94 mg/dL).  Liver Function Tests: Recent Labs  Lab 08/23/24 1715  AST 37  ALT 17  ALKPHOS 47  BILITOT 0.9  PROT 7.1  ALBUMIN 3.6   No results for input(s): LIPASE, AMYLASE in the last 168 hours. No results for input(s): AMMONIA in the last 168 hours.  Coagulation Profile: No results for input(s): INR, PROTIME in the last 168 hours.  Cardiac Enzymes: Recent Labs  Lab 08/23/24 2000  CKTOTAL 346    BNP (last 3 results) No results  for input(s): PROBNP in the last 8760 hours.  Lipid Profile: No results for input(s): CHOL, HDL, LDLCALC, TRIG, CHOLHDL, LDLDIRECT in the last 72 hours.  Thyroid  Function Tests: No results for input(s): TSH, T4TOTAL, FREET4, T3FREE, THYROIDAB in the last 72 hours.  Anemia Panel: No results for input(s): VITAMINB12, FOLATE, FERRITIN, TIBC, IRON, RETICCTPCT in the last 72 hours.  Urine analysis:    Component Value Date/Time   COLORURINE YELLOW 09/13/2021 1431   APPEARANCEUR CLEAR 09/13/2021 1431   LABSPEC 1.013 09/13/2021 1431   PHURINE 6.0 09/13/2021 1431   GLUCOSEU NEGATIVE 09/13/2021 1431   HGBUR NEGATIVE 09/13/2021 1431  BILIRUBINUR NEGATIVE 09/13/2021 1431   BILIRUBINUR negative 09/12/2020 1122   KETONESUR NEGATIVE 09/13/2021 1431   PROTEINUR NEGATIVE 09/13/2021 1431   UROBILINOGEN 0.2 09/12/2020 1122   UROBILINOGEN 1.0 06/21/2010 1122   NITRITE NEGATIVE 09/13/2021 1431   LEUKOCYTESUR NEGATIVE 09/13/2021 1431    Sepsis Labs: Lactic Acid, Venous No results found for: LATICACIDVEN  MICROBIOLOGY: Recent Results (from the past 240 hours)  Resp panel by RT-PCR (RSV, Flu A&B, Covid) Anterior Nasal Swab     Status: Abnormal   Collection Time: 08/23/24  4:41 PM   Specimen: Anterior Nasal Swab  Result Value Ref Range Status   SARS Coronavirus 2 by RT PCR POSITIVE (A) NEGATIVE Final   Influenza A by PCR NEGATIVE NEGATIVE Final   Influenza B by PCR NEGATIVE NEGATIVE Final    Comment: (NOTE) The Xpert Xpress SARS-CoV-2/FLU/RSV plus assay is intended as an aid in the diagnosis of influenza from Nasopharyngeal swab specimens and should not be used as a sole basis for treatment. Nasal washings and aspirates are unacceptable for Xpert Xpress SARS-CoV-2/FLU/RSV testing.  Fact Sheet for Patients: BloggerCourse.com  Fact Sheet for Healthcare Providers: SeriousBroker.it  This test is not yet  approved or cleared by the [place]  FDA and has been authorized for detection and/or diagnosis of SARS-CoV-2 by FDA under an Emergency Use Authorization (EUA). This EUA will remain in effect (meaning this test can be used) for the duration of the COVID-19 declaration under Section 564(b)(1) of the Act, 21 U.S.C. section 360bbb-3(b)(1), unless the authorization is terminated or revoked.     Resp Syncytial Virus by PCR NEGATIVE NEGATIVE Final    Comment: (NOTE) Fact Sheet for Patients: BloggerCourse.com  Fact Sheet for Healthcare Providers: SeriousBroker.it  This test is not yet approved or cleared by the [place]  FDA and has been authorized for detection and/or diagnosis of SARS-CoV-2 by FDA under an Emergency Use Authorization (EUA). This EUA will remain in effect (meaning this test can be used) for the duration of the COVID-19 declaration under Section 564(b)(1) of the Act, 21 U.S.C. section 360bbb-3(b)(1), unless the authorization is terminated or revoked.  Performed at [facility], [address], [place], [place] [address]     RADIOLOGY STUDIES/RESULTS: DG Chest Port 1V same Day Result Date: 08/27/2024 CLINICAL DATA:  Shortness of breath. EXAM: PORTABLE CHEST 1 VIEW COMPARISON:  08/23/2024. FINDINGS: Are new heterogeneous alveolar and interstitial opacities overlying the right lung with asymmetric more involvement of the lateral aspect of the right mid lung zone, favored to represent multilobar pneumonia. Follow-up to clearing is recommended. Bilateral lung fields are otherwise clear. Bilateral lateral costophrenic angles are clear. Stable cardio-mediastinal silhouette. There are surgical staples along the heart border and sternotomy wires, status post CABG (coronary artery bypass graft). No acute osseous abnormalities. The soft tissues are within normal limits. IMPRESSION: New heterogeneous alveolar and  interstitial opacities overlying the right lung with asymmetric more involvement of the lateral aspect of the right mid lung zone, favored to represent multilobar pneumonia. Follow-up to clearing is recommended. Electronically Signed   By: Ree Molt M.D.   On: 08/27/2024 16:04     LOS: 4 days   Donalda Applebaum, MD  [facility]    To contact the attending provider between 7A-7P or the covering provider during after hours 7P-7A, please log into the web site [email or web address] and access using universal  password for that web site. If you do not have the password, please call the hospital operator.  08/28/2024,  10:21 AM

## 2024-08-28 NOTE — TOC Initial Note (Addendum)
 Transition of Care [facility]) - Initial/Assessment Note    Patient Details  Name: Jose [name] MRN: [id number] Date of Birth: 02-23-1936  Transition of [facility]) CM/SW Contact:    Luise JAYSON Pan, LCSWA Phone Number: 08/28/2024, 2:21 PM  Clinical Narrative:    CSW following for SNF recommendation due to family not being able to provide care for patient at home.                Expected Discharge Plan: Skilled Nursing Facility Barriers to Discharge: Continued Medical Work up, SNF Pending bed offer, Insurance Authorization   Patient Goals and CMS Choice Patient states their goals for this hospitalization and ongoing recovery are:: to go home CMS Medicare.gov Compare Post Acute Care list provided to:: Patient Choice offered to / list presented to : Patient      Expected Discharge Plan and Services   Discharge Planning Services: CM Consult Post Acute Care Choice: Durable Medical Equipment, Home Health Living arrangements for the past 2 months: Single Family Home                 DME Arranged: Bedside commode DME Agency: [employer] Date DME Agency Contacted: 08/25/24 Time DME Agency Contacted: [phone] Representative spoke with at DME Agency: Jermaine- to deliver to the home HH Arranged: PT, OT HH Agency: [facility] Date [facility] Agency Contacted: 08/25/24 Time HH Agency Contacted: 1621 Representative spoke with at [facility] Agency: Darleene  Prior Living Arrangements/Services Living arrangements for the past 2 months: Single Family Home Lives with:: Spouse              Current home services: DME (walker)    Activities of Daily Living   ADL Screening (condition at time of admission) Independently performs ADLs?: No Does the patient have a NEW difficulty with bathing/dressing/toileting/self-feeding that is expected to last >3 days?: No Does the patient have a NEW difficulty with getting in/out of bed, walking, or climbing stairs that is expected to last >3 days?:  Yes (Initiates electronic notice to provider for possible PT consult) Does the patient have a NEW difficulty with communication that is expected to last >3 days?: Yes (Initiates electronic notice to provider for possible SLP consult) Is the patient deaf or have difficulty hearing?: Yes Does the patient have difficulty seeing, even when wearing glasses/contacts?: No Does the patient have difficulty concentrating, remembering, or making decisions?: Yes  Permission Sought/Granted                  Emotional Assessment              Admission diagnosis:  Elevated serum creatinine [R79.89] Fall, initial encounter [W19.XXXA] Fatigue, unspecified type [R53.83] COVID-19 virus infection [U07.1] COVID-19 [U07.1] Patient Active Problem List   Diagnosis Date Noted   Elevated serum creatinine 08/24/2024   Fall 08/24/2024   Fatigue 08/24/2024   Oropharyngeal dysphagia 08/24/2024   COVID-19 08/24/2024   COVID-19 virus infection 08/23/2024   LBBB (left bundle branch block) 08/10/2020   Chest pain 10/09/2013   HTN (hypertension) 10/09/2013   Coronary artery disease, hx of two inf. MIs one with v. fib arrest, stents to RCA and ultimately in 2011 CABG     Hx of myocardial infarction    Hyperlipidemia LDL goal <70    Hx of ventricular fibrillation    PCP:  Royden [name], FNP Pharmacy:   [facility] - [place], [place] - [address] [place] [address] Phone: [phone]  Fax: [phone]     Social Drivers of Health (SDOH) Social History: SDOH Screenings   Food Insecurity: No Food Insecurity (08/24/2024)  Housing: Low Risk  (08/24/2024)  Transportation Needs: No Transportation Needs (08/24/2024)  Utilities: Not At Risk (08/24/2024)  Social Connections: Moderately Isolated (08/24/2024)  Tobacco Use: Low Risk  (08/23/2024)   SDOH Interventions:     Readmission Risk Interventions     No data to display

## 2024-08-29 ENCOUNTER — Encounter (HOSPITAL_COMMUNITY)

## 2024-08-29 ENCOUNTER — Inpatient Hospital Stay (HOSPITAL_COMMUNITY)

## 2024-08-29 DIAGNOSIS — R7989 Other specified abnormal findings of blood chemistry: Secondary | ICD-10-CM | POA: Diagnosis not present

## 2024-08-29 DIAGNOSIS — U071 COVID-19: Secondary | ICD-10-CM | POA: Diagnosis not present

## 2024-08-29 LAB — BASIC METABOLIC PANEL WITH GFR
Anion gap: 14 (ref 5–15)
BUN: 29 mg/dL — ABNORMAL HIGH (ref 8–23)
CO2: 18 mmol/L — ABNORMAL LOW (ref 22–32)
Calcium: 8.4 mg/dL — ABNORMAL LOW (ref 8.9–10.3)
Chloride: 104 mmol/L (ref 98–111)
Creatinine, Ser: 1.07 mg/dL (ref 0.61–1.24)
GFR, Estimated: >60 mL/min (ref >60)
Glucose, Bld: 230 mg/dL — ABNORMAL HIGH (ref 70–99)
Potassium: 4.2 mmol/L (ref 3.5–5.1)
Sodium: 136 mmol/L (ref 135–145)

## 2024-08-29 LAB — CBC
HCT: 43.7 % (ref 39.0–52.0)
Hemoglobin: 15 g/dL (ref 13.0–17.0)
MCH: 31.4 pg (ref 26.0–34.0)
MCHC: 34.3 g/dL (ref 30.0–36.0)
MCV: 91.4 fL (ref 80.0–100.0)
Platelets: 162 K/uL (ref 150–400)
RBC: 4.78 MIL/uL (ref 4.22–5.81)
RDW: 12 % (ref 11.5–15.5)
WBC: 3.6 K/uL — ABNORMAL LOW (ref 4.0–10.5)
nRBC: 0 % (ref 0.0–0.2)

## 2024-08-29 LAB — C-REACTIVE PROTEIN: CRP: 8.1 mg/dL — ABNORMAL HIGH (ref <1.0)

## 2024-08-29 LAB — D-DIMER, QUANTITATIVE: D-Dimer, Quant: 0.96 ug{FEU}/mL — ABNORMAL HIGH (ref 0.00–0.50)

## 2024-08-29 MED ORDER — FUROSEMIDE 10 MG/ML IJ SOLN
20.0000 mg | Freq: Once | INTRAMUSCULAR | Status: AC
  Administered 2024-08-29: 20 mg via INTRAVENOUS
  Filled 2024-08-29: qty 2

## 2024-08-29 NOTE — Progress Notes (Signed)
 PROGRESS NOTE        PATIENT DETAILS Name: Jose [name] Age: 88 y.o. Sex: male Date of Birth: 11-06-1936 Admit Date: 08/23/2024 Admitting Physician Terry LOISE Hurst, DO ERE:Emzcndu, [name], FNP  Brief Summary: Patient is a 88 y.o.  male with history of CAD s/p CABG, chronic combined CHF, HLD, HTN-who presented with weakness/fever/chills/poor oral intake-was found to have COVID-19 infection.  Significant events: 9/21>> admit to [facility] 9/26>> steroids/antibiotics started-chest x-ray with multifocal pneumonia-requiring around 4-5 L of oxygen.  Significant studies: 9/21>> CXR: No PNA 9/21>> CT head: No acute intracranial abnormality 9/21>> CT C-spine: No fracture 9/25>> CXR: Multifocal PNA 9/26>> echo: EF 45-50%, grade 1 diastolic dysfunction.  Significant microbiology data: 9/21>> COVID PCR: Positive 9/21>> influenza/RSV PCR: Negative  Procedures: None  Consults: None  Subjective: No major issues overnight-Down to 3 L of oxygen.  Objective: Vitals: Blood pressure 130/68, pulse 74, temperature 98 F (36.7 C), temperature source Oral, resp. rate 20, height 5' 11 (1.803 m), weight 80.7 kg, SpO2 92%.   Exam: Awake/alert Chest: Clear to auscultation CVS: S1-S2 regular Abdomen: Soft nontender nondistended.  Pertinent Labs/Radiology:    Latest Ref Rng & Units 08/29/2024    5:23 AM 08/28/2024    6:01 AM 08/26/2024    3:20 AM  CBC  WBC 4.0 - 10.5 K/uL 3.6  4.7  6.8   Hemoglobin 13.0 - 17.0 g/dL 84.9  84.8  85.8   Hematocrit 39.0 - 52.0 % 43.7  44.3  41.0   Platelets 150 - 400 K/uL 162  153  118     Lab Results  Component Value Date   NA 136 08/29/2024   K 4.2 08/29/2024   CL 104 08/29/2024   CO2 18 (L) 08/29/2024     Assessment/Plan: Acute hypoxic respiratory failure secondary to COVID-19 pneumonia-+/bacterial PNA  Improved-Down to 3 L of oxygen CRP downtrending Continue steroids/Unasyn /Zithromax  Although no signs of volume  overload-1 dose of IV Lasix  to ensure he remains in negative balance Discussed with nursing staff to see if he can mobilize-out of bed to chair/incentive spirometry/flutter valve D-dimer minimally elevated-doubt VTE-but awaiting Dopplers-on prophylactic Lovenox .  Chronic diastolic/systolic heart failure Euvolemic-but will give 1 dose of IV Lasix  to ensure negative balance Echo stable-see above.  CAD s/p CABG No anginal symptoms Continue aspirin /statin  Poor oral intake/failure to thrive syndrome Secondary to acute illness/COVID-19 infection Continue PT/OT eval-SNF recommended.    CKD stage IIIa Close to baseline.  HLD Statin/Zetia   HTN BP stable Amlodipine .  Code status:   Code Status: Full Code   DVT Prophylaxis: enoxaparin  (LOVENOX ) injection 40 mg Start: 08/23/24 2130   Family Communication: Daughter-[name] -updated over the phone 9/27   Disposition Plan: Status is: Inpatient Remains inpatient appropriate because: Severity of illness   Planned Discharge Destination:Skilled nursing facility   Diet: Diet Order             Diet regular Room service appropriate? Yes with Assist; Fluid consistency: Thin  Diet effective now                     Antimicrobial agents: Anti-infectives (From admission, onward)    Start     Dose/Rate Route Frequency Ordered Stop   08/28/24 0800  azithromycin  (ZITHROMAX ) 500 mg in sodium chloride  0.9 % 250 mL IVPB        500  mg 250 mL/hr over 60 Minutes Intravenous Every 24 hours 08/28/24 0653 08/31/24 0759   08/28/24 0800  Ampicillin -Sulbactam (UNASYN ) 3 g in sodium chloride  0.9 % 100 mL IVPB        3 g 200 mL/hr over 30 Minutes Intravenous Every 6 hours 08/28/24 0659 09/02/24 0759        MEDICATIONS: Scheduled Meds:  amLODipine   10 mg Oral Daily   aspirin  EC  81 mg Oral Daily   enoxaparin  (LOVENOX ) injection  40 mg Subcutaneous Q24H   ezetimibe   10 mg Oral Daily   feeding supplement  1 Container Oral  TID BM   feeding supplement  237 mL Oral TID BM   guaiFENesin   600 mg Oral BID   methylPREDNISolone  (SOLU-MEDROL ) injection  80 mg Intravenous Q24H   pravastatin   10 mg Oral QHS   Continuous Infusions:  ampicillin -sulbactam (UNASYN ) IV 3 g (08/29/24 0914)   azithromycin  500 mg (08/29/24 0913)   PRN Meds:.acetaminophen , acetaminophen , guaiFENesin -dextromethorphan , Ipratropium-Albuterol , melatonin, phenol, polyethylene glycol, prochlorperazine    I have personally reviewed following labs and imaging studies  LABORATORY DATA: CBC: Recent Labs  Lab 08/23/24 1715 08/24/24 0338 08/25/24 0233 08/26/24 0320 08/28/24 0601 08/29/24 0523  WBC 8.2 12.7* 12.1* 6.8 4.7 3.6*  NEUTROABS 7.1  --   --   --   --   --   HGB 15.6 15.4 14.1 14.1 15.1 15.0  HCT 47.2 46.7 42.0 41.0 44.3 43.7  MCV 94.6 94.7 92.7 91.7 90.8 91.4  PLT 145* 146* 129* 118* 153 162    Basic Metabolic Panel: Recent Labs  Lab 08/24/24 0338 08/25/24 0233 08/26/24 0320 08/27/24 0356 08/28/24 0601 08/29/24 0523  NA 137 136 138 136 135 136  K 4.1 3.6 3.6 3.4* 3.4* 4.2  CL 102 104 103 105 101 104  CO2 22 21* 25 23 21* 18*  GLUCOSE 134* 116* 133* 122* 113* 230*  BUN 21 25* 27* 27* 21 29*  CREATININE 1.33* 1.11 1.19 1.26* 0.94 1.07  CALCIUM 8.5* 8.3* 8.3* 8.0* 8.2* 8.4*  MG 1.8  --   --   --   --   --   PHOS 3.8  --   --   --   --   --     GFR: Estimated Creatinine Clearance: 52.8 mL/min (by C-G formula based on SCr of 1.07 mg/dL).  Liver Function Tests: Recent Labs  Lab 08/23/24 1715  AST 37  ALT 17  ALKPHOS 47  BILITOT 0.9  PROT 7.1  ALBUMIN 3.6   No results for input(s): LIPASE, AMYLASE in the last 168 hours. No results for input(s): AMMONIA in the last 168 hours.  Coagulation Profile: No results for input(s): INR, PROTIME in the last 168 hours.  Cardiac Enzymes: Recent Labs  Lab 08/23/24 2000  CKTOTAL 346    BNP (last 3 results) No results for input(s): PROBNP in the last 8760  hours.  Lipid Profile: No results for input(s): CHOL, HDL, LDLCALC, TRIG, CHOLHDL, LDLDIRECT in the last 72 hours.  Thyroid  Function Tests: No results for input(s): TSH, T4TOTAL, FREET4, T3FREE, THYROIDAB in the last 72 hours.  Anemia Panel: No results for input(s): VITAMINB12, FOLATE, FERRITIN, TIBC, IRON, RETICCTPCT in the last 72 hours.  Urine analysis:    Component Value Date/Time   COLORURINE YELLOW 09/13/2021 1431   APPEARANCEUR CLEAR 09/13/2021 1431   LABSPEC 1.013 09/13/2021 1431   PHURINE 6.0 09/13/2021 1431   GLUCOSEU NEGATIVE 09/13/2021 1431   HGBUR NEGATIVE 09/13/2021 1431  BILIRUBINUR NEGATIVE 09/13/2021 1431   BILIRUBINUR negative 09/12/2020 1122   KETONESUR NEGATIVE 09/13/2021 1431   PROTEINUR NEGATIVE 09/13/2021 1431   UROBILINOGEN 0.2 09/12/2020 1122   UROBILINOGEN 1.0 06/21/2010 1122   NITRITE NEGATIVE 09/13/2021 1431   LEUKOCYTESUR NEGATIVE 09/13/2021 1431    Sepsis Labs: Lactic Acid, Venous No results found for: LATICACIDVEN  MICROBIOLOGY: Recent Results (from the past 240 hours)  Resp panel by RT-PCR (RSV, Flu A&B, Covid) Anterior Nasal Swab     Status: Abnormal   Collection Time: 08/23/24  4:41 PM   Specimen: Anterior Nasal Swab  Result Value Ref Range Status   SARS Coronavirus 2 by RT PCR POSITIVE (A) NEGATIVE Final   Influenza A by PCR NEGATIVE NEGATIVE Final   Influenza B by PCR NEGATIVE NEGATIVE Final    Comment: (NOTE) The Xpert Xpress SARS-CoV-2/FLU/RSV plus assay is intended as an aid in the diagnosis of influenza from Nasopharyngeal swab specimens and should not be used as a sole basis for treatment. Nasal washings and aspirates are unacceptable for Xpert Xpress SARS-CoV-2/FLU/RSV testing.  Fact Sheet for Patients: BloggerCourse.com  Fact Sheet for Healthcare Providers: SeriousBroker.it  This test is not yet approved or cleared by the [place] FDA and has been authorized for detection and/or diagnosis of SARS-CoV-2 by FDA under an Emergency Use Authorization (EUA). This EUA will remain in effect (meaning this test can be used) for the duration of the COVID-19 declaration under Section 564(b)(1) of the Act, 21 U.S.C. section 360bbb-3(b)(1), unless the authorization is terminated or revoked.     Resp Syncytial Virus by PCR NEGATIVE NEGATIVE Final    Comment: (NOTE) Fact Sheet for Patients: BloggerCourse.com  Fact Sheet for Healthcare Providers: SeriousBroker.it  This test is not yet approved or cleared by the [place]  FDA and has been authorized for detection and/or diagnosis of SARS-CoV-2 by FDA under an Emergency Use Authorization (EUA). This EUA will remain in effect (meaning this test can be used) for the duration of the COVID-19 declaration under Section 564(b)(1) of the Act, 21 U.S.C. section 360bbb-3(b)(1), unless the authorization is terminated or revoked.  Performed at [facility], [address] [place], [place] [address]   Culture, blood (Routine X 2) w Reflex to ID Panel     Status: None (Preliminary result)   Collection Time: 08/28/24 11:49 AM   Specimen: BLOOD  Result Value Ref Range Status   Specimen Description BLOOD SITE NOT SPECIFIED  Final   Special Requests   Final    BOTTLES DRAWN AEROBIC AND ANAEROBIC Blood Culture adequate volume   Culture   Final    NO GROWTH < 24 HOURS Performed at [facility], [address] [place], [place] [address]    Report Status PENDING  Incomplete  Culture, blood (Routine X 2) w Reflex to ID Panel     Status: None (Preliminary result)   Collection Time: 08/28/24 11:49 AM   Specimen: BLOOD  Result Value Ref Range Status   Specimen Description BLOOD SITE NOT SPECIFIED  Final   Special Requests   Final    BOTTLES DRAWN AEROBIC AND ANAEROBIC Blood Culture adequate volume   Culture   Final     NO GROWTH < 24 HOURS Performed at [facility], [address], [place], [place] [address]    Report Status PENDING  Incomplete    RADIOLOGY STUDIES/RESULTS: ECHOCARDIOGRAM COMPLETE Result Date: 08/28/2024    ECHOCARDIOGRAM REPORT   Patient Name:   Jose [name] Date of Exam: 08/28/2024 Medical Rec #:  [id number]      Height:       71.0 in Accession #:    [id number]     Weight:       176.4 lb Date of Birth:  12-09-1935      BSA:          1.999 m Patient Age:    88 years       BP:           93/64 mmHg Patient Gender: M              HR:           71 bpm. Exam Location:  Inpatient Procedure: 2D Echo (Both Spectral and Color Flow Doppler were utilized during            procedure). Indications:    acute respiratory distress  History:        Patient has prior history of Echocardiogram examinations, most                 recent 10/18/2021. Abnormal ECG and Prior CABG, Covid,                 Arrythmias:LBBB, Signs/Symptoms:Chest Pain; Risk                 Factors:Dyslipidemia and Hypertension.  Sonographer:    Tinnie Barefoot RDCS Referring Phys: LEOMA [facility]  Sonographer Comments: Image acquisition challenging due to respiratory motion. IMPRESSIONS  1. Left ventricular ejection fraction, by estimation, is 45 to 50%. The left ventricle has mildly decreased function. The left ventricle demonstrates global hypokinesis. Left ventricular diastolic parameters are consistent with Grade I diastolic dysfunction (impaired relaxation).  2. Right ventricular systolic function is normal. The right ventricular size is normal.  3. Left atrial size was moderately dilated.  4. The mitral valve is normal in structure. No evidence of mitral valve regurgitation. No evidence of mitral stenosis.  5. The aortic valve is tricuspid. Aortic valve regurgitation is mild. No aortic stenosis is present.  6. Aortic dilatation noted. There is mild dilatation of the aortic root, measuring 40 mm. There is borderline dilatation  of the ascending aorta, measuring 39 mm.  7. The inferior vena cava is normal in size with greater than 50% respiratory variability, suggesting right atrial pressure of 3 mmHg. FINDINGS  Left Ventricle: Left ventricular ejection fraction, by estimation, is 45 to 50%. The left ventricle has mildly decreased function. The left ventricle demonstrates global hypokinesis. The left ventricular internal cavity size was normal in size. There is  no left ventricular hypertrophy. Abnormal (paradoxical) septal motion, consistent with left bundle branch block. Left ventricular diastolic parameters are consistent with Grade I diastolic dysfunction (impaired relaxation). Right Ventricle: The right ventricular size is normal. No increase in right ventricular wall thickness. Right ventricular systolic function is normal. Left Atrium: Left atrial size was moderately dilated. Right Atrium: Right atrial size was normal in size. Pericardium: There is no evidence of pericardial effusion. Mitral Valve: The mitral valve is normal in structure. No evidence of mitral valve regurgitation. No evidence of mitral valve stenosis. Tricuspid Valve: The tricuspid valve is normal in structure. Tricuspid valve regurgitation is not demonstrated. No evidence of tricuspid stenosis. Aortic Valve: The aortic valve is tricuspid. Aortic valve regurgitation is mild. Aortic regurgitation PHT measures 690 msec. No aortic stenosis is present. Pulmonic Valve: The pulmonic valve was normal in structure. Pulmonic valve regurgitation is not  visualized. No evidence of pulmonic stenosis. Aorta: Aortic dilatation noted. There is mild dilatation of the aortic root, measuring 40 mm. There is borderline dilatation of the ascending aorta, measuring 39 mm. Venous: The inferior vena cava is normal in size with greater than 50% respiratory variability, suggesting right atrial pressure of 3 mmHg. IAS/Shunts: No atrial level shunt detected by color flow Doppler.  LEFT  VENTRICLE PLAX 2D LVIDd:         5.40 cm   Diastology LVIDs:         4.40 cm   LV e' medial:    3.70 cm/s LV PW:         1.10 cm   LV E/e' medial:  11.3 LV IVS:        1.10 cm   LV e' lateral:   7.83 cm/s LVOT diam:     2.20 cm   LV E/e' lateral: 5.4 LV SV:         65 LV SV Index:   33 LVOT Area:     3.80 cm  RIGHT VENTRICLE RV Basal diam:  2.70 cm TAPSE (M-mode): 1.8 cm LEFT ATRIUM              Index        RIGHT ATRIUM           Index LA diam:        3.90 cm  1.95 cm/m   RA Area:     16.30 cm LA Vol (A2C):   101.0 ml 50.53 ml/m  RA Volume:   39.80 ml  19.91 ml/m LA Vol (A4C):   62.6 ml  31.32 ml/m LA Biplane Vol: 80.0 ml  40.02 ml/m  AORTIC VALVE LVOT Vmax:   85.20 cm/s LVOT Vmean:  54.800 cm/s LVOT VTI:    0.172 m AI PHT:      690 msec  AORTA Ao Root diam: 4.00 cm Ao Asc diam:  3.90 cm MV E velocity: 41.90 cm/s  TRICUSPID VALVE MV A velocity: 80.50 cm/s  TR Peak grad:   0.0 mmHg MV E/A ratio:  0.52        TR Vmax:        11.10 cm/s                             SHUNTS                            Systemic VTI:  0.17 m                            Systemic Diam: 2.20 cm Oneil Parchment MD Electronically signed by Oneil Parchment MD Signature Date/Time: 08/28/2024/5:54:38 PM    Final    DG Chest Port 1V same Day Result Date: 08/27/2024 CLINICAL DATA:  Shortness of breath. EXAM: PORTABLE CHEST 1 VIEW COMPARISON:  08/23/2024. FINDINGS: Are new heterogeneous alveolar and interstitial opacities overlying the right lung with asymmetric more involvement of the lateral aspect of the right mid lung zone, favored to represent multilobar pneumonia. Follow-up to clearing is recommended. Bilateral lung fields are otherwise clear. Bilateral lateral costophrenic angles are clear. Stable cardio-mediastinal silhouette. There are surgical staples along the heart border and sternotomy wires, status post CABG (coronary artery bypass graft). No acute osseous abnormalities. The soft tissues are within normal limits. IMPRESSION: New  heterogeneous alveolar  and interstitial opacities overlying the right lung with asymmetric more involvement of the lateral aspect of the right mid lung zone, favored to represent multilobar pneumonia. Follow-up to clearing is recommended. Electronically Signed   By: Ree Molt M.D.   On: 08/27/2024 16:04     LOS: 5 days   Donalda Applebaum, MD  [facility]    To contact the attending provider between 7A-7P or the covering provider during after hours 7P-7A, please log into the web site [email or web address] and access using universal [place] password for that web site. If you do not have the password, please call the hospital operator.  08/29/2024, 10:18 AM

## 2024-08-29 NOTE — Plan of Care (Signed)

## 2024-08-29 NOTE — Plan of Care (Signed)
  Problem: Education: Goal: Knowledge of General Education information will improve Description: Including pain rating scale, medication(s)/side effects and non-pharmacologic comfort measures 08/29/2024 1700 by Con Marylen SAUNDERS, RN Outcome: Progressing 08/29/2024 1629 by Con Marylen SAUNDERS, RN Outcome: Progressing   Problem: Health Behavior/Discharge Planning: Goal: Ability to manage health-related needs will improve 08/29/2024 1700 by Con Marylen SAUNDERS, RN Outcome: Progressing 08/29/2024 1629 by Con Marylen SAUNDERS, RN Outcome: Progressing   Problem: Clinical Measurements: Goal: Ability to maintain clinical measurements within normal limits will improve Outcome: Progressing Goal: Will remain free from infection 08/29/2024 1700 by Con Marylen SAUNDERS, RN Outcome: Progressing 08/29/2024 1629 by Con Marylen SAUNDERS, RN Outcome: Progressing Goal: Diagnostic test results will improve Outcome: Progressing Goal: Respiratory complications will improve Outcome: Progressing Goal: Cardiovascular complication will be avoided Outcome: Progressing   Problem: Activity: Goal: Risk for activity intolerance will decrease 08/29/2024 1700 by Con Marylen SAUNDERS, RN Outcome: Progressing 08/29/2024 1629 by Con Marylen SAUNDERS, RN Outcome: Progressing   Problem: Nutrition: Goal: Adequate nutrition will be maintained Outcome: Progressing   Problem: Coping: Goal: Level of anxiety will decrease Outcome: Progressing

## 2024-08-30 DIAGNOSIS — U071 COVID-19: Secondary | ICD-10-CM | POA: Diagnosis not present

## 2024-08-30 DIAGNOSIS — W19XXXS Unspecified fall, sequela: Secondary | ICD-10-CM

## 2024-08-30 DIAGNOSIS — R7989 Other specified abnormal findings of blood chemistry: Secondary | ICD-10-CM | POA: Diagnosis not present

## 2024-08-30 LAB — CBC
HCT: 42.6 % (ref 39.0–52.0)
Hemoglobin: 15 g/dL (ref 13.0–17.0)
MCH: 30.7 pg (ref 26.0–34.0)
MCHC: 35.2 g/dL (ref 30.0–36.0)
MCV: 87.3 fL (ref 80.0–100.0)
Platelets: 224 K/uL (ref 150–400)
RBC: 4.88 MIL/uL (ref 4.22–5.81)
RDW: 11.9 % (ref 11.5–15.5)
WBC: 11.6 K/uL — ABNORMAL HIGH (ref 4.0–10.5)
nRBC: 0 % (ref 0.0–0.2)

## 2024-08-30 LAB — BASIC METABOLIC PANEL WITH GFR
Anion gap: 10 (ref 5–15)
BUN: 29 mg/dL — ABNORMAL HIGH (ref 8–23)
CO2: 20 mmol/L — ABNORMAL LOW (ref 22–32)
Calcium: 8.6 mg/dL — ABNORMAL LOW (ref 8.9–10.3)
Chloride: 108 mmol/L (ref 98–111)
Creatinine, Ser: 1.05 mg/dL (ref 0.61–1.24)
GFR, Estimated: >60 mL/min (ref >60)
Glucose, Bld: 255 mg/dL — ABNORMAL HIGH (ref 70–99)
Potassium: 3.2 mmol/L — ABNORMAL LOW (ref 3.5–5.1)
Sodium: 138 mmol/L (ref 135–145)

## 2024-08-30 LAB — C-REACTIVE PROTEIN: CRP: 3.5 mg/dL — ABNORMAL HIGH (ref <1.0)

## 2024-08-30 MED ORDER — PREDNISONE 20 MG PO TABS
40.0000 mg | ORAL_TABLET | Freq: Every day | ORAL | Status: DC
  Administered 2024-08-31 – 2024-09-01 (×2): 40 mg via ORAL
  Filled 2024-08-30 (×2): qty 2

## 2024-08-30 MED ORDER — POTASSIUM CHLORIDE CRYS ER 20 MEQ PO TBCR
40.0000 meq | EXTENDED_RELEASE_TABLET | Freq: Once | ORAL | Status: AC
  Administered 2024-08-30: 40 meq via ORAL
  Filled 2024-08-30: qty 2

## 2024-08-30 NOTE — Plan of Care (Signed)
   Problem: Education: Goal: Knowledge of General Education information will improve Description Including pain rating scale, medication(s)/side effects and non-pharmacologic comfort measures Outcome: Progressing

## 2024-08-30 NOTE — Plan of Care (Signed)

## 2024-08-30 NOTE — Progress Notes (Addendum)
 PROGRESS NOTE        PATIENT DETAILS Name: Jose [name] Age: 88 y.o. Sex: male Date of Birth: [birth year] Admit Date: 08/23/2024 Admitting Physician Terry LOISE Hurst, DO ERE:Emzcndu, [name], FNP  Brief Summary: Patient is a 88 y.o.  male with history of CAD s/p CABG, chronic combined CHF, HLD, HTN-who presented with weakness/fever/chills/poor oral intake-was found to have COVID-19 infection.  Significant events: 9/21>> admit to [facility] 9/26>> steroids/antibiotics started-chest x-ray with multifocal pneumonia-requiring around 4-5 L of oxygen.  Significant studies: 9/21>> CXR: No PNA 9/21>> CT head: No acute intracranial abnormality 9/21>> CT C-spine: No fracture 9/25>> CXR: Multifocal PNA 9/26>> echo: EF 45-50%, grade 1 diastolic dysfunction.  Significant microbiology data: 9/21>> COVID PCR: Positive 9/21>> influenza/RSV PCR: Negative  Procedures: None  Consults: None  Subjective: No major issues-improving-Down to 2 L of oxygen.  Objective: Vitals: Blood pressure 136/74, pulse 78, temperature 97.8 F (36.6 C), temperature source Axillary, resp. rate (!) 21, height 5' 11 (1.803 m), weight 80.7 kg, SpO2 91%.   Exam: Awake/alert Chest: Clear to auscultation CVS: S1-S2 regular Abdomen: Soft nontender nondistended Extremities: No edema. Nonfocal exam  Pertinent Labs/Radiology:    Latest Ref Rng & Units 08/30/2024    6:28 AM 08/29/2024    5:23 AM 08/28/2024    6:01 AM  CBC  WBC 4.0 - 10.5 K/uL 11.6  3.6  4.7   Hemoglobin 13.0 - 17.0 g/dL 84.9  84.9  84.8   Hematocrit 39.0 - 52.0 % 42.6  43.7  44.3   Platelets 150 - 400 K/uL 224  162  153     Lab Results  Component Value Date   NA 138 08/30/2024   K 3.2 (L) 08/30/2024   CL 108 08/30/2024   CO2 20 (L) 08/30/2024     Assessment/Plan: Acute hypoxic respiratory failure secondary to COVID-19 pneumonia-+/bacterial PNA  Improved-Down to 2 L of oxygen CRP continues to downtrend Change  from Solu-Medrol  to prednisone  Continue Unasyn /Zithromax  Volume status stable-do not think we need any further diuretics today D-dimer minimally elevated-doubt VTE-has been on prophylactic Lovenox -awaiting Dopplers.  Chronic diastolic/systolic heart failure Euvolemic-no further diuretics thought to be needed today. Echo stable-see above.  Hypokalemia Replete/recheck.  CAD s/p CABG No anginal symptoms Continue aspirin /statin  Poor oral intake/failure to thrive syndrome Secondary to acute illness/COVID-19 infection Continue PT/OT eval-SNF recommended.    CKD stage IIIa Close to baseline.  HLD Statin/Zetia   HTN BP stable Amlodipine .  Code status:   Code Status: Full Code   DVT Prophylaxis: enoxaparin  (LOVENOX ) injection 40 mg Start: 08/23/24 2130   Family Communication: Daughter-[name]-[phone] -updated over the phone 9/27   Disposition Plan: Status is: Inpatient Remains inpatient appropriate because: Severity of illness   Planned Discharge Destination:Skilled nursing facility   Diet: Diet Order             Diet regular Room service appropriate? Yes with Assist; Fluid consistency: Thin  Diet effective now                     Antimicrobial agents: Anti-infectives (From admission, onward)    Start     Dose/Rate Route Frequency Ordered Stop   08/28/24 0800  azithromycin  (ZITHROMAX ) 500 mg in sodium chloride  0.9 % 250 mL IVPB        500 mg 250 mL/hr over 60 Minutes Intravenous Every 24  hours 08/28/24 0653 08/31/24 0759   08/28/24 0800  Ampicillin -Sulbactam (UNASYN ) 3 g in sodium chloride  0.9 % 100 mL IVPB        3 g 200 mL/hr over 30 Minutes Intravenous Every 6 hours 08/28/24 0659 09/02/24 0759        MEDICATIONS: Scheduled Meds:  amLODipine   10 mg Oral Daily   aspirin  EC  81 mg Oral Daily   enoxaparin  (LOVENOX ) injection  40 mg Subcutaneous Q24H   ezetimibe   10 mg Oral Daily   feeding supplement  1 Container Oral TID BM   feeding  supplement  237 mL Oral TID BM   guaiFENesin   600 mg Oral BID   methylPREDNISolone  (SOLU-MEDROL ) injection  80 mg Intravenous Q24H   pravastatin   10 mg Oral QHS   Continuous Infusions:  ampicillin -sulbactam (UNASYN ) IV 3 g (08/30/24 0808)   azithromycin  500 mg (08/29/24 0913)   PRN Meds:.acetaminophen , acetaminophen , guaiFENesin -dextromethorphan , Ipratropium-Albuterol , melatonin, phenol, polyethylene glycol, prochlorperazine    I have personally reviewed following labs and imaging studies  LABORATORY DATA: CBC: Recent Labs  Lab 08/23/24 1715 08/24/24 0338 08/25/24 0233 08/26/24 0320 08/28/24 0601 08/29/24 0523 08/30/24 0628  WBC 8.2   < > 12.1* 6.8 4.7 3.6* 11.6*  NEUTROABS 7.1  --   --   --   --   --   --   HGB 15.6   < > 14.1 14.1 15.1 15.0 15.0  HCT 47.2   < > 42.0 41.0 44.3 43.7 42.6  MCV 94.6   < > 92.7 91.7 90.8 91.4 87.3  PLT 145*   < > 129* 118* 153 162 224   < > = values in this interval not displayed.    Basic Metabolic Panel: Recent Labs  Lab 08/24/24 0338 08/25/24 0233 08/26/24 0320 08/27/24 0356 08/28/24 0601 08/29/24 0523 08/30/24 0628  NA 137   < > 138 136 135 136 138  K 4.1   < > 3.6 3.4* 3.4* 4.2 3.2*  CL 102   < > 103 105 101 104 108  CO2 22   < > 25 23 21* 18* 20*  GLUCOSE 134*   < > 133* 122* 113* 230* 255*  BUN 21   < > 27* 27* 21 29* 29*  CREATININE 1.33*   < > 1.19 1.26* 0.94 1.07 1.05  CALCIUM 8.5*   < > 8.3* 8.0* 8.2* 8.4* 8.6*  MG 1.8  --   --   --   --   --   --   PHOS 3.8  --   --   --   --   --   --    < > = values in this interval not displayed.    GFR: Estimated Creatinine Clearance: 53.8 mL/min (by C-G formula based on SCr of 1.05 mg/dL).  Liver Function Tests: Recent Labs  Lab 08/23/24 1715  AST 37  ALT 17  ALKPHOS 47  BILITOT 0.9  PROT 7.1  ALBUMIN 3.6   No results for input(s): LIPASE, AMYLASE in the last 168 hours. No results for input(s): AMMONIA in the last 168 hours.  Coagulation Profile: No results  for input(s): INR, PROTIME in the last 168 hours.  Cardiac Enzymes: Recent Labs  Lab 08/23/24 2000  CKTOTAL 346    BNP (last 3 results) No results for input(s): PROBNP in the last 8760 hours.  Lipid Profile: No results for input(s): CHOL, HDL, LDLCALC, TRIG, CHOLHDL, LDLDIRECT in the last 72 hours.  Thyroid  Function Tests: No  results for input(s): TSH, T4TOTAL, FREET4, T3FREE, THYROIDAB in the last 72 hours.  Anemia Panel: No results for input(s): VITAMINB12, FOLATE, FERRITIN, TIBC, IRON, RETICCTPCT in the last 72 hours.  Urine analysis:    Component Value Date/Time   COLORURINE YELLOW 09/13/2021 1431   APPEARANCEUR CLEAR 09/13/2021 1431   LABSPEC 1.013 09/13/2021 1431   PHURINE 6.0 09/13/2021 1431   GLUCOSEU NEGATIVE 09/13/2021 1431   HGBUR NEGATIVE 09/13/2021 1431   BILIRUBINUR NEGATIVE 09/13/2021 1431   BILIRUBINUR negative 09/12/2020 1122   KETONESUR NEGATIVE 09/13/2021 1431   PROTEINUR NEGATIVE 09/13/2021 1431   UROBILINOGEN 0.2 09/12/2020 1122   UROBILINOGEN 1.0 06/21/2010 1122   NITRITE NEGATIVE 09/13/2021 1431   LEUKOCYTESUR NEGATIVE 09/13/2021 1431    Sepsis Labs: Lactic Acid, Venous No results found for: LATICACIDVEN  MICROBIOLOGY: Recent Results (from the past 240 hours)  Resp panel by RT-PCR (RSV, Flu A&B, Covid) Anterior Nasal Swab     Status: Abnormal   Collection Time: 08/23/24  4:41 PM   Specimen: Anterior Nasal Swab  Result Value Ref Range Status   SARS Coronavirus 2 by RT PCR POSITIVE (A) NEGATIVE Final   Influenza A by PCR NEGATIVE NEGATIVE Final   Influenza B by PCR NEGATIVE NEGATIVE Final    Comment: (NOTE) The Xpert Xpress SARS-CoV-2/FLU/RSV plus assay is intended as an aid in the diagnosis of influenza from Nasopharyngeal swab specimens and should not be used as a sole basis for treatment. Nasal washings and aspirates are unacceptable for Xpert Xpress SARS-CoV-2/FLU/RSV testing.  Fact Sheet  for Patients: BloggerCourse.com  Fact Sheet for Healthcare Providers: SeriousBroker.it  This test is not yet approved or cleared by the [place]  FDA and has been authorized for detection and/or diagnosis of SARS-CoV-2 by FDA under an Emergency Use Authorization (EUA). This EUA will remain in effect (meaning this test can be used) for the duration of the COVID-19 declaration under Section 564(b)(1) of the Act, 21 U.S.C. section 360bbb-3(b)(1), unless the authorization is terminated or revoked.     Resp Syncytial Virus by PCR NEGATIVE NEGATIVE Final    Comment: (NOTE) Fact Sheet for Patients: BloggerCourse.com  Fact Sheet for Healthcare Providers: SeriousBroker.it  This test is not yet approved or cleared by the [place]  FDA and has been authorized for detection and/or diagnosis of SARS-CoV-2 by FDA under an Emergency Use Authorization (EUA). This EUA will remain in effect (meaning this test can be used) for the duration of the COVID-19 declaration under Section 564(b)(1) of the Act, 21 U.S.C. section 360bbb-3(b)(1), unless the authorization is terminated or revoked.  Performed at [facility], [address], [place], [place] [address]   Culture, blood (Routine X 2) w Reflex to ID Panel     Status: None (Preliminary result)   Collection Time: 08/28/24 11:49 AM   Specimen: BLOOD  Result Value Ref Range Status   Specimen Description BLOOD SITE NOT SPECIFIED  Final   Special Requests   Final    BOTTLES DRAWN AEROBIC AND ANAEROBIC Blood Culture adequate volume   Culture   Final    NO GROWTH 2 DAYS Performed at [facility], [address] [place], [place] [address]    Report Status PENDING  Incomplete  Culture, blood (Routine X 2) w Reflex to ID Panel     Status: None (Preliminary result)   Collection Time: 08/28/24 11:49 AM   Specimen: BLOOD  Result  Value Ref Range Status   Specimen Description BLOOD SITE NOT SPECIFIED  Final   Special Requests   Final    BOTTLES DRAWN AEROBIC AND ANAEROBIC Blood Culture adequate volume   Culture   Final    NO GROWTH 2 DAYS Performed at [facility], [address] [place], [place] [address]    Report Status PENDING  Incomplete    RADIOLOGY STUDIES/RESULTS: DG Chest Port 1 View Result Date: 08/29/2024 CLINICAL DATA:  Shortness of breath. Follow-up probable multi lobar pneumonia on the right. EXAM: PORTABLE CHEST 1 VIEW COMPARISON:  08/27/2024 FINDINGS: Stable borderline enlarged cardiac silhouette. Tortuous and partially calcified thoracic aorta. Decreased patchy opacity in the right mid lung zone. Clear left lung. Stable post CABG changes. Unremarkable bones. IMPRESSION: Decreased right mid lung zone pneumonia. Electronically Signed   By: Elspeth Bathe M.D.   On: 08/29/2024 10:41   ECHOCARDIOGRAM COMPLETE Result Date: 08/28/2024    ECHOCARDIOGRAM REPORT   Patient Name:   Jose [name] Date of Exam: 08/28/2024 Medical Rec #:  [id number]      Height:       71.0 in Accession #:    [id number]     Weight:       176.4 lb Date of Birth:  1936/01/21      BSA:          1.999 m Patient Age:    88 years       BP:           93/64 mmHg Patient Gender: M              HR:           71 bpm. Exam Location:  Inpatient Procedure: 2D Echo (Both Spectral and Color Flow Doppler were utilized during            procedure). Indications:    acute respiratory distress  History:        Patient has prior history of Echocardiogram examinations, most                 recent 10/18/2021. Abnormal ECG and Prior CABG, Covid,                 Arrythmias:LBBB, Signs/Symptoms:Chest Pain; Risk                 Factors:Dyslipidemia and Hypertension.  Sonographer:    Tinnie Barefoot RDCS Referring Phys: [facility]  Sonographer Comments: Image acquisition challenging due to respiratory motion. IMPRESSIONS  1. Left ventricular ejection  fraction, by estimation, is 45 to 50%. The left ventricle has mildly decreased function. The left ventricle demonstrates global hypokinesis. Left ventricular diastolic parameters are consistent with Grade I diastolic dysfunction (impaired relaxation).  2. Right ventricular systolic function is normal. The right ventricular size is normal.  3. Left atrial size was moderately dilated.  4. The mitral valve is normal in structure. No evidence of mitral valve regurgitation. No evidence of mitral stenosis.  5. The aortic valve is tricuspid. Aortic valve regurgitation is mild. No aortic stenosis is present.  6. Aortic dilatation noted. There is mild dilatation of the aortic root, measuring 40 mm. There is borderline dilatation of the ascending aorta, measuring 39 mm.  7. The inferior vena cava is normal in size with greater than 50% respiratory variability, suggesting right atrial pressure of 3 mmHg. FINDINGS  Left Ventricle: Left ventricular ejection fraction, by estimation, is 45 to 50%. The left ventricle has mildly decreased function. The left ventricle demonstrates global hypokinesis. The left ventricular internal cavity  size was normal in size. There is  no left ventricular hypertrophy. Abnormal (paradoxical) septal motion, consistent with left bundle branch block. Left ventricular diastolic parameters are consistent with Grade I diastolic dysfunction (impaired relaxation). Right Ventricle: The right ventricular size is normal. No increase in right ventricular wall thickness. Right ventricular systolic function is normal. Left Atrium: Left atrial size was moderately dilated. Right Atrium: Right atrial size was normal in size. Pericardium: There is no evidence of pericardial effusion. Mitral Valve: The mitral valve is normal in structure. No evidence of mitral valve regurgitation. No evidence of mitral valve stenosis. Tricuspid Valve: The tricuspid valve is normal in structure. Tricuspid valve regurgitation is not  demonstrated. No evidence of tricuspid stenosis. Aortic Valve: The aortic valve is tricuspid. Aortic valve regurgitation is mild. Aortic regurgitation PHT measures 690 msec. No aortic stenosis is present. Pulmonic Valve: The pulmonic valve was normal in structure. Pulmonic valve regurgitation is not visualized. No evidence of pulmonic stenosis. Aorta: Aortic dilatation noted. There is mild dilatation of the aortic root, measuring 40 mm. There is borderline dilatation of the ascending aorta, measuring 39 mm. Venous: The inferior vena cava is normal in size with greater than 50% respiratory variability, suggesting right atrial pressure of 3 mmHg. IAS/Shunts: No atrial level shunt detected by color flow Doppler.  LEFT VENTRICLE PLAX 2D LVIDd:         5.40 cm   Diastology LVIDs:         4.40 cm   LV e' medial:    3.70 cm/s LV PW:         1.10 cm   LV E/e' medial:  11.3 LV IVS:        1.10 cm   LV e' lateral:   7.83 cm/s LVOT diam:     2.20 cm   LV E/e' lateral: 5.4 LV SV:         65 LV SV Index:   33 LVOT Area:     3.80 cm  RIGHT VENTRICLE RV Basal diam:  2.70 cm TAPSE (M-mode): 1.8 cm LEFT ATRIUM              Index        RIGHT ATRIUM           Index LA diam:        3.90 cm  1.95 cm/m   RA Area:     16.30 cm LA Vol (A2C):   101.0 ml 50.53 ml/m  RA Volume:   39.80 ml  19.91 ml/m LA Vol (A4C):   62.6 ml  31.32 ml/m LA Biplane Vol: 80.0 ml  40.02 ml/m  AORTIC VALVE LVOT Vmax:   85.20 cm/s LVOT Vmean:  54.800 cm/s LVOT VTI:    0.172 m AI PHT:      690 msec  AORTA Ao Root diam: 4.00 cm Ao Asc diam:  3.90 cm MV E velocity: 41.90 cm/s  TRICUSPID VALVE MV A velocity: 80.50 cm/s  TR Peak grad:   0.0 mmHg MV E/A ratio:  0.52        TR Vmax:        11.10 cm/s                             SHUNTS                            Systemic VTI:  0.17 m  Systemic Diam: 2.20 cm Oneil Parchment MD Electronically signed by Oneil Parchment MD Signature Date/Time: 08/28/2024/5:54:38 PM    Final      LOS: 6 days    Donalda Applebaum, MD  [facility]    To contact the attending provider between 7A-7P or the covering provider during after hours 7P-7A, please log into the web site [email or web address] and access using universal [place] password for that web site. If you do not have the password, please call the hospital operator.  08/30/2024, 9:48 AM

## 2024-08-31 ENCOUNTER — Inpatient Hospital Stay (HOSPITAL_COMMUNITY)

## 2024-08-31 DIAGNOSIS — R7989 Other specified abnormal findings of blood chemistry: Secondary | ICD-10-CM | POA: Diagnosis not present

## 2024-08-31 DIAGNOSIS — R609 Edema, unspecified: Secondary | ICD-10-CM

## 2024-08-31 DIAGNOSIS — R5383 Other fatigue: Secondary | ICD-10-CM | POA: Diagnosis not present

## 2024-08-31 DIAGNOSIS — W19XXXA Unspecified fall, initial encounter: Secondary | ICD-10-CM | POA: Diagnosis not present

## 2024-08-31 DIAGNOSIS — U071 COVID-19: Secondary | ICD-10-CM | POA: Diagnosis not present

## 2024-08-31 LAB — BASIC METABOLIC PANEL WITH GFR
Anion gap: 7 (ref 5–15)
BUN: 25 mg/dL — ABNORMAL HIGH (ref 8–23)
CO2: 24 mmol/L (ref 22–32)
Calcium: 8.4 mg/dL — ABNORMAL LOW (ref 8.9–10.3)
Chloride: 108 mmol/L (ref 98–111)
Creatinine, Ser: 1.03 mg/dL (ref 0.61–1.24)
GFR, Estimated: >60 mL/min (ref >60)
Glucose, Bld: 171 mg/dL — ABNORMAL HIGH (ref 70–99)
Potassium: 4.1 mmol/L (ref 3.5–5.1)
Sodium: 139 mmol/L (ref 135–145)

## 2024-08-31 LAB — PROCALCITONIN: Procalcitonin: 0.4 ng/mL

## 2024-08-31 LAB — MAGNESIUM: Magnesium: 1.7 mg/dL (ref 1.7–2.4)

## 2024-08-31 NOTE — Plan of Care (Signed)
  Problem: Education: Goal: Knowledge of General Education information will improve Description: Including pain rating scale, medication(s)/side effects and non-pharmacologic comfort measures Outcome: Progressing   Problem: Health Behavior/Discharge Planning: Goal: Ability to manage health-related needs will improve Outcome: Progressing   Problem: Clinical Measurements: Goal: Ability to maintain clinical measurements within normal limits will improve Outcome: Progressing   Problem: Clinical Measurements: Goal: Ability to maintain clinical measurements within normal limits will improve Outcome: Progressing   Problem: Clinical Measurements: Goal: Ability to maintain clinical measurements within normal limits will improve Outcome: Progressing Goal: Will remain free from infection Outcome: Progressing Goal: Diagnostic test results will improve Outcome: Progressing Goal: Respiratory complications will improve Outcome: Progressing

## 2024-08-31 NOTE — Progress Notes (Signed)
 PROGRESS NOTE        PATIENT DETAILS Name: KAYL STOGDILL Age: 88 y.o. Sex: male Date of Birth: 06/18/1936 Admit Date: 08/23/2024 Admitting Physician Terry LOISE Hurst, DO ERE:Emzcndu, [name], FNP  Brief Summary: Patient is a 88 y.o.  male with history of CAD s/p CABG, chronic combined CHF, HLD, HTN-who presented with weakness/fever/chills/poor oral intake-was found to have COVID-19 infection.  Significant events: 9/21>> admit to [facility] 9/26>> steroids/antibiotics started-chest x-ray with multifocal pneumonia-requiring around 4-5 L of oxygen.  Significant studies: 9/21>> CXR: No PNA 9/21>> CT head: No acute intracranial abnormality 9/21>> CT C-spine: No fracture 9/25>> CXR: Multifocal PNA 9/26>> echo: EF 45-50%, grade 1 diastolic dysfunction.  Significant microbiology data: 9/21>> COVID PCR: Positive 9/21>> influenza/RSV PCR: Negative  Procedures: None  Consults: None  Subjective: No major issues overnight-on 2 Litres of oxygen  Objective: Vitals: Blood pressure 138/66, pulse 68, temperature 98.1 F (36.7 C), temperature source Oral, resp. rate 16, height 5' 11 (1.803 m), weight 80.7 kg, SpO2 93%.   Exam: Gen Exam:Alert awake-not in any distress HEENT:atraumatic, normocephalic Chest: B/L clear to auscultation anteriorly CVS:S1S2 regular Abdomen:soft non tender, non distended Extremities:no edema Neurology: Non focal Skin: no rash  Pertinent Labs/Radiology:    Latest Ref Rng & Units 08/30/2024    6:28 AM 08/29/2024    5:23 AM 08/28/2024    6:01 AM  CBC  WBC 4.0 - 10.5 K/uL 11.6  3.6  4.7   Hemoglobin 13.0 - 17.0 g/dL 84.9  84.9  84.8   Hematocrit 39.0 - 52.0 % 42.6  43.7  44.3   Platelets 150 - 400 K/uL 224  162  153     Lab Results  Component Value Date   NA 139 08/31/2024   K 4.1 08/31/2024   CL 108 08/31/2024   CO2 24 08/31/2024     Assessment/Plan: Acute hypoxic respiratory failure secondary to COVID-19  pneumonia-+/bacterial PNA  Stable-remains on 2 L of oxygen Taper prednisone  over the next several days Improved-Down to 2 L of oxygen CRP continues to downtrend Completed Zithromax  x 3 days Empiric Unasyn  x 5 days total Does not require diuretics-volumes are stable. Minimally elevated D-dimer-on prophylactic Lovenox -awaiting Dopplers.  Doubt VTE though.  Chronic diastolic/systolic heart failure Euvolemic-no further diuretics thought to be needed today. Echo stable-see above.  Hypokalemia Repleted.  CAD s/p CABG No anginal symptoms Continue aspirin /statin  Poor oral intake/failure to thrive syndrome Secondary to acute illness/COVID-19 infection Continue PT/OT eval-SNF recommended.    CKD stage IIIa Close to baseline.  HLD Statin/Zetia   HTN BP stable Amlodipine .  Code status:   Code Status: Full Code   DVT Prophylaxis: enoxaparin  (LOVENOX ) injection 40 mg Start: 08/23/24 2130   Family Communication: Daughter-[name] -updated over the phone 9/29   Disposition Plan: Status is: Inpatient Remains inpatient appropriate because: Severity of illness   Planned Discharge Destination:Skilled nursing facility   Diet: Diet Order             Diet regular Room service appropriate? No; Fluid consistency: Thin  Diet effective now                     Antimicrobial agents: Anti-infectives (From admission, onward)    Start     Dose/Rate Route Frequency Ordered Stop   08/28/24 0800  azithromycin  (ZITHROMAX ) 500 mg in sodium chloride  0.9 %  250 mL IVPB        500 mg 250 mL/hr over 60 Minutes Intravenous Every 24 hours 08/28/24 0653 08/30/24 1059   08/28/24 0800  Ampicillin -Sulbactam (UNASYN ) 3 g in sodium chloride  0.9 % 100 mL IVPB        3 g 200 mL/hr over 30 Minutes Intravenous Every 6 hours 08/28/24 0659 09/02/24 0759        MEDICATIONS: Scheduled Meds:  amLODipine   10 mg Oral Daily   aspirin  EC  81 mg Oral Daily   enoxaparin  (LOVENOX )  injection  40 mg Subcutaneous Q24H   ezetimibe   10 mg Oral Daily   feeding supplement  1 Container Oral TID BM   feeding supplement  237 mL Oral TID BM   guaiFENesin   600 mg Oral BID   pravastatin   10 mg Oral QHS   predniSONE   40 mg Oral Q breakfast   Continuous Infusions:  ampicillin -sulbactam (UNASYN ) IV 3 g (08/31/24 0809)   PRN Meds:.acetaminophen , acetaminophen , guaiFENesin -dextromethorphan , Ipratropium-Albuterol , melatonin, phenol, polyethylene glycol, prochlorperazine    I have personally reviewed following labs and imaging studies  LABORATORY DATA: CBC: Recent Labs  Lab 08/25/24 0233 08/26/24 0320 08/28/24 0601 08/29/24 0523 08/30/24 0628  WBC 12.1* 6.8 4.7 3.6* 11.6*  HGB 14.1 14.1 15.1 15.0 15.0  HCT 42.0 41.0 44.3 43.7 42.6  MCV 92.7 91.7 90.8 91.4 87.3  PLT 129* 118* 153 162 224    Basic Metabolic Panel: Recent Labs  Lab 08/27/24 0356 08/28/24 0601 08/29/24 0523 08/30/24 0628 08/31/24 0504  NA 136 135 136 138 139  K 3.4* 3.4* 4.2 3.2* 4.1  CL 105 101 104 108 108  CO2 23 21* 18* 20* 24  GLUCOSE 122* 113* 230* 255* 171*  BUN 27* 21 29* 29* 25*  CREATININE 1.26* 0.94 1.07 1.05 1.03  CALCIUM 8.0* 8.2* 8.4* 8.6* 8.4*  MG  --   --   --   --  1.7    GFR: Estimated Creatinine Clearance: 54.8 mL/min (by C-G formula based on SCr of 1.03 mg/dL).  Liver Function Tests: No results for input(s): AST, ALT, ALKPHOS, BILITOT, PROT, ALBUMIN in the last 168 hours.  No results for input(s): LIPASE, AMYLASE in the last 168 hours. No results for input(s): AMMONIA in the last 168 hours.  Coagulation Profile: No results for input(s): INR, PROTIME in the last 168 hours.  Cardiac Enzymes: No results for input(s): CKTOTAL, CKMB, CKMBINDEX, TROPONINI in the last 168 hours.   BNP (last 3 results) No results for input(s): PROBNP in the last 8760 hours.  Lipid Profile: No results for input(s): CHOL, HDL, LDLCALC, TRIG,  CHOLHDL, LDLDIRECT in the last 72 hours.  Thyroid  Function Tests: No results for input(s): TSH, T4TOTAL, FREET4, T3FREE, THYROIDAB in the last 72 hours.  Anemia Panel: No results for input(s): VITAMINB12, FOLATE, FERRITIN, TIBC, IRON, RETICCTPCT in the last 72 hours.  Urine analysis:    Component Value Date/Time   COLORURINE YELLOW 09/13/2021 1431   APPEARANCEUR CLEAR 09/13/2021 1431   LABSPEC 1.013 09/13/2021 1431   PHURINE 6.0 09/13/2021 1431   GLUCOSEU NEGATIVE 09/13/2021 1431   HGBUR NEGATIVE 09/13/2021 1431   BILIRUBINUR NEGATIVE 09/13/2021 1431   BILIRUBINUR negative 09/12/2020 1122   KETONESUR NEGATIVE 09/13/2021 1431   PROTEINUR NEGATIVE 09/13/2021 1431   UROBILINOGEN 0.2 09/12/2020 1122   UROBILINOGEN 1.0 06/21/2010 1122   NITRITE NEGATIVE 09/13/2021 1431   LEUKOCYTESUR NEGATIVE 09/13/2021 1431    Sepsis Labs: Lactic Acid, Venous No results found for: LATICACIDVEN  MICROBIOLOGY: Recent Results (from the past 240 hours)  Resp panel by RT-PCR (RSV, Flu A&B, Covid) Anterior Nasal Swab     Status: Abnormal   Collection Time: 08/23/24  4:41 PM   Specimen: Anterior Nasal Swab  Result Value Ref Range Status   SARS Coronavirus 2 by RT PCR POSITIVE (A) NEGATIVE Final   Influenza A by PCR NEGATIVE NEGATIVE Final   Influenza B by PCR NEGATIVE NEGATIVE Final    Comment: (NOTE) The Xpert Xpress SARS-CoV-2/FLU/RSV plus assay is intended as an aid in the diagnosis of influenza from Nasopharyngeal swab specimens and should not be used as a sole basis for treatment. Nasal washings and aspirates are unacceptable for Xpert Xpress SARS-CoV-2/FLU/RSV testing.  Fact Sheet for Patients: BloggerCourse.com  Fact Sheet for Healthcare Providers: SeriousBroker.it  This test is not yet approved or cleared by the [place]  FDA and has been authorized for detection and/or diagnosis of SARS-CoV-2 by FDA  under an Emergency Use Authorization (EUA). This EUA will remain in effect (meaning this test can be used) for the duration of the COVID-19 declaration under Section 564(b)(1) of the Act, 21 U.S.C. section 360bbb-3(b)(1), unless the authorization is terminated or revoked.     Resp Syncytial Virus by PCR NEGATIVE NEGATIVE Final    Comment: (NOTE) Fact Sheet for Patients: BloggerCourse.com  Fact Sheet for Healthcare Providers: SeriousBroker.it  This test is not yet approved or cleared by the [place]  FDA and has been authorized for detection and/or diagnosis of SARS-CoV-2 by FDA under an Emergency Use Authorization (EUA). This EUA will remain in effect (meaning this test can be used) for the duration of the COVID-19 declaration under Section 564(b)(1) of the Act, 21 U.S.C. section 360bbb-3(b)(1), unless the authorization is terminated or revoked.  Performed at [facility], [address] [place], [place] [address]   Culture, blood (Routine X 2) w Reflex to ID Panel     Status: None (Preliminary result)   Collection Time: 08/28/24 11:49 AM   Specimen: BLOOD  Result Value Ref Range Status   Specimen Description BLOOD SITE NOT SPECIFIED  Final   Special Requests   Final    BOTTLES DRAWN AEROBIC AND ANAEROBIC Blood Culture adequate volume   Culture   Final    NO GROWTH 3 DAYS Performed at [facility], [address], [place], [place] [address]    Report Status PENDING  Incomplete  Culture, blood (Routine X 2) w Reflex to ID Panel     Status: None (Preliminary result)   Collection Time: 08/28/24 11:49 AM   Specimen: BLOOD  Result Value Ref Range Status   Specimen Description BLOOD SITE NOT SPECIFIED  Final   Special Requests   Final    BOTTLES DRAWN AEROBIC AND ANAEROBIC Blood Culture adequate volume   Culture   Final    NO GROWTH 3 DAYS Performed at [facility], [address], [place], [place]  [address]    Report Status PENDING  Incomplete    RADIOLOGY STUDIES/RESULTS: No results found.    LOS: 7 days   Donalda Applebaum, MD  [facility]    To contact the attending provider between 7A-7P or the covering provider during after hours 7P-7A, please log into the web site [email or web address] and access using universal [place] password for that web site. If you do not have the password, please call the hospital operator.  08/31/2024, 9:36 AM

## 2024-08-31 NOTE — TOC Progression Note (Signed)
 Transition of Care [facility]) - Progression Note    Patient Details  Name: Jose [name] MRN: [id number] Date of Birth: 09-25-1936  [facility] CM/SW Contact  Isaiah Public, LCSWA Phone Number: 08/31/2024, 3:38 PM  Clinical Narrative:     CSW spoke with patient to confirm dc plan/discuss PT recs. Due to patients orientation, CSW followed up with his daughter marval. [name] confirmed with CSW that plan is for patient to go to SNF when medically ready for dc. [name] informed CSW that number one choice is [facility]. CSW spoke with [name] with [facility] who confirmed she will review referral and follow up with CSW tomorrow on if facility can offer SNF bed for patient. CSW informed marval patients daughter. CSW provided patients daughter with additional SNF rehab offers. CSW informed marval that CSW tomorrow will follow up with [name] with [facility] on if they can offer and then follow up with her on SNF choice. All questions answered. No further questions reported at this time.CSW following to start insurance authorization for patient.   Expected Discharge Plan: Skilled Nursing Facility Barriers to Discharge: Continued Medical Work up, SNF Pending bed offer, English as a second language teacher               Expected Discharge Plan and Services   Discharge Planning Services: CM Consult Post Acute Care Choice: Durable Medical Equipment, Home Health Living arrangements for the past 2 months: Single Family Home                 DME Arranged: Bedside commode DME Agency: [employer] Date DME Agency Contacted: 08/25/24 Time DME Agency Contacted: [phone] Representative spoke with at DME Agency: Jermaine- to deliver to the home HH Arranged: PT, OT HH Agency: [facility] Date [facility] Agency Contacted: 08/25/24 Time HH Agency Contacted: [phone] Representative spoke with at [facility] Agency: Darleene   Social Drivers of Health (SDOH) Interventions SDOH Screenings   Food Insecurity: No Food  Insecurity (08/24/2024)  Housing: Low Risk  (08/24/2024)  Transportation Needs: No Transportation Needs (08/24/2024)  Utilities: Not At Risk (08/24/2024)  Social Connections: Moderately Isolated (08/24/2024)  Tobacco Use: Low Risk  (08/23/2024)    Readmission Risk Interventions     No data to display

## 2024-08-31 NOTE — Plan of Care (Signed)

## 2024-08-31 NOTE — Progress Notes (Signed)
 VASCULAR LAB    Bilateral lower extremity venous duplex has been performed.  See CV proc for preliminary results.   Alondra Sahni, RVT 08/31/2024, 10:11 AM

## 2024-08-31 NOTE — Progress Notes (Signed)
 Physical Therapy Treatment Patient Details Name: Jose [name] MRN: [id number] DOB: 11/08/1936 Today's Date: 08/31/2024   History of Present Illness 88 y.o. male presents to [facility] hospital on 08/23/2024 with generalized weakness, found to be positive for COVID. PMH includes CABG, HLD, HTN, CHF.    PT Comments  Pt progressing well however remains to demo decreased activity tolerance, impaired balance, requires use of RW for safe ambulation, and increased fall risk. Pt remains on 2Lo2 via [place]. Pt continues to benefit from inpatient physical therapy program < 3 hrs a day to progress towards safe mod I level of function for safe transition home.    If plan is discharge home, recommend the following: Assist for transportation;A lot of help with walking and/or transfers;Help with stairs or ramp for entrance;Assistance with cooking/housework;A lot of help with bathing/dressing/bathroom   Can travel by private vehicle     Yes  Equipment Recommendations  BSC/3in1    Recommendations for Other Services       Precautions / Restrictions Precautions Precautions: Fall Recall of Precautions/Restrictions: Impaired Precaution/Restrictions Comments: COVID+ Restrictions Weight Bearing Restrictions Per Provider Order: No     Mobility  Bed Mobility Overal bed mobility: Needs Assistance Bed Mobility: Supine to Sit     Supine to sit: Supervision, Used rails, HOB elevated     General bed mobility comments: increased time, HOB elevated, use of bed rail    Transfers Overall transfer level: Needs assistance Equipment used: Rolling walker (2 wheels) Transfers: Sit to/from Stand Sit to Stand: Min assist           General transfer comment: minA to power up, verbal cues for hand placement    Ambulation/Gait Ambulation/Gait assistance: Min assist Gait Distance (Feet): 20 Feet Assistive device: Rolling walker (2 wheels) Gait Pattern/deviations: Step-to pattern, Trunk flexed Gait velocity:  reduced Gait velocity interpretation: <1.31 ft/sec, indicative of household ambulator   General Gait Details: minA for walker management, pt with short step height and length, decreased pace   Stairs             Wheelchair Mobility     Tilt Bed    Modified Rankin (Stroke Patients Only)       Balance Overall balance assessment: Needs assistance Sitting-balance support: Feet supported Sitting balance-Leahy Scale: Fair Sitting balance - Comments: able to don shoes sitting EOB   Standing balance support: Bilateral upper extremity supported Standing balance-Leahy Scale: Poor Standing balance comment: UE support for balance and min A                            Communication Communication Communication: Impaired Factors Affecting Communication: Hearing impaired  Cognition Arousal: Alert Behavior During Therapy: WFL for tasks assessed/performed   PT - Cognitive impairments: No family/caregiver present to determine baseline, Safety/Judgement, Problem solving, Attention, Initiation, Sequencing                       PT - Cognition Comments: pt with decreased comprehension of situation Following commands: Impaired Following commands impaired: Only follows one step commands consistently, Follows multi-step commands inconsistently, Follows one step commands with increased time    Cueing Cueing Techniques: Verbal cues  Exercises      General Comments General comments (skin integrity, edema, etc.): poor pleth, SpO2 90% on 2Lo2 via [place] at end of amb      Pertinent Vitals/Pain Pain Assessment Pain Assessment: Faces Faces Pain Scale: Hurts a little bit Pain  Location: generalized Pain Descriptors / Indicators: Tiring Pain Intervention(s): Monitored during session    Home Living                          Prior Function            PT Goals (current goals can now be found in the care plan section) Acute Rehab PT Goals PT Goal Formulation:  With patient Time For Goal Achievement: 09/07/24 Potential to Achieve Goals: Fair Progress towards PT goals: Progressing toward goals    Frequency    Min 2X/week      PT Plan      Co-evaluation              AM-PAC PT 6 Clicks Mobility   Outcome Measure  Help needed turning from your back to your side while in a flat bed without using bedrails?: A Little Help needed moving from lying on your back to sitting on the side of a flat bed without using bedrails?: A Little Help needed moving to and from a bed to a chair (including a wheelchair)?: A Little Help needed standing up from a chair using your arms (e.g., wheelchair or bedside chair)?: A Lot Help needed to walk in hospital room?: A Lot Help needed climbing 3-5 steps with a railing? : A Lot 6 Click Score: 15    End of Session Equipment Utilized During Treatment: Gait belt Activity Tolerance: Treatment limited secondary to medical complications (Comment) (decr O2 and BP) Patient left: with call bell/phone within reach;with family/visitor present;in chair;with chair alarm set Nurse Communication: Mobility status PT Visit Diagnosis: Other abnormalities of gait and mobility (R26.89);Muscle weakness (generalized) (M62.81);History of falling (Z91.81)     Time: 8773-8745 PT Time Calculation (min) (ACUTE ONLY): 28 min  Charges:    $Gait Training: 8-22 mins $Therapeutic Activity: 8-22 mins PT General Charges $$ ACUTE PT VISIT: 1 Visit                     Norene Ames, PT, DPT Acute Rehabilitation Services Secure chat preferred Office #: [phone]    Norene CHRISTELLA Ames 08/31/2024, 3:04 PM

## 2024-09-01 DIAGNOSIS — W19XXXA Unspecified fall, initial encounter: Secondary | ICD-10-CM | POA: Diagnosis not present

## 2024-09-01 DIAGNOSIS — R5383 Other fatigue: Secondary | ICD-10-CM | POA: Diagnosis not present

## 2024-09-01 DIAGNOSIS — U071 COVID-19: Secondary | ICD-10-CM | POA: Diagnosis not present

## 2024-09-01 MED ORDER — PREDNISONE 5 MG PO TABS
30.0000 mg | ORAL_TABLET | Freq: Every day | ORAL | Status: DC
  Administered 2024-09-02: 30 mg via ORAL
  Filled 2024-09-01: qty 2

## 2024-09-01 NOTE — Progress Notes (Signed)
 Occupational Therapy Treatment Patient Details Name: Jose [name] MRN: [id number] DOB: 24-Apr-1936 Today's Date: 09/01/2024   History of present illness 88 y.o. male presents to [facility] hospital on 08/23/2024 with generalized weakness, found to be positive for COVID. PMH includes CABG, HLD, HTN, CHF.   OT comments  Patient continues to make progress with OT treatment with patient requiring increased time for bed mobility and self care tasks with patient often requiring cues to initiate or stay on tasks. Patient requiring min assist for mobility and transfers with cues for hand placement and safety.  Patient will benefit from continued inpatient follow up therapy, <3 hours/day.  Acute OT to continue to follow to address established goals to facilitate DC to next venue of care.        If plan is discharge home, recommend the following:  A lot of help with walking and/or transfers;A lot of help with bathing/dressing/bathroom;Assistance with cooking/housework;Direct supervision/assist for medications management;Direct supervision/assist for financial management;Assist for transportation;Help with stairs or ramp for entrance;Supervision due to cognitive status   Equipment Recommendations  Other (comment) (defer to next venue of care)    Recommendations for Other Services      Precautions / Restrictions Precautions Precautions: Fall Recall of Precautions/Restrictions: Impaired Precaution/Restrictions Comments: COVID+ Restrictions Weight Bearing Restrictions Per Provider Order: No       Mobility Bed Mobility Overal bed mobility: Needs Assistance Bed Mobility: Supine to Sit     Supine to sit: Supervision, Used rails, HOB elevated     General bed mobility comments: increased time and cues to initiate    Transfers Overall transfer level: Needs assistance Equipment used: Rolling walker (2 wheels) Transfers: Sit to/from Stand Sit to Stand: Min assist           General transfer  comment: min assist to power up from EOB and chair     Balance Overall balance assessment: Needs assistance Sitting-balance support: Feet supported Sitting balance-Leahy Scale: Fair Sitting balance - Comments: EOB   Standing balance support: Bilateral upper extremity supported Standing balance-Leahy Scale: Poor Standing balance comment: min assist for standing balance at sink                           ADL either performed or assessed with clinical judgement   ADL Overall ADL's : Needs assistance/impaired Eating/Feeding: Set up   Grooming: Wash/dry hands;Wash/dry face;Oral care;Brushing hair;Set up;Sitting Grooming Details (indicate cue type and reason): required cues to initiate         Upper Body Dressing : Set up;Sitting Upper Body Dressing Details (indicate cue type and reason): gown Lower Body Dressing: Moderate assistance;Sitting/lateral leans;Sit to/from stand Lower Body Dressing Details (indicate cue type and reason): assistance with shoes Toilet Transfer: Minimal assistance;Ambulation;Rolling walker (2 wheels) Toilet Transfer Details (indicate cue type and reason): simulated         Functional mobility during ADLs: Minimal assistance;Rolling walker (2 wheels)      Extremity/Trunk Assessment              Vision       Perception     Praxis     Communication Communication Communication: Impaired Factors Affecting Communication: Hearing impaired   Cognition Arousal: Alert Behavior During Therapy: WFL for tasks assessed/performed Cognition: No family/caregiver present to determine baseline, Cognition impaired   Orientation impairments: Time, Situation Awareness: Intellectual awareness impaired, Online awareness impaired Memory impairment (select all impairments): Short-term memory, Working memory Attention impairment (select first level of  impairment): Sustained attention Executive functioning impairment (select all impairments): Problem  solving OT - Cognition Comments: able to give correct month and knew he was in hospital but not why                 Following commands: Impaired Following commands impaired: Only follows one step commands consistently, Follows multi-step commands inconsistently, Follows one step commands with increased time      Cueing   Cueing Techniques: Verbal cues  Exercises      Shoulder Instructions       General Comments 2 liters O2 with SpO2 94% at rest and 90% with mobility    Pertinent Vitals/ Pain       Pain Assessment Pain Assessment: Faces Faces Pain Scale: Hurts a little bit Pain Location: generalized Pain Descriptors / Indicators: Grimacing Pain Intervention(s): Monitored during session, Repositioned  Home Living                                          Prior Functioning/Environment              Frequency  Min 2X/week        Progress Toward Goals  OT Goals(current goals can now be found in the care plan section)  Progress towards OT goals: Progressing toward goals  Acute Rehab OT Goals Patient Stated Goal: unable OT Goal Formulation: Patient unable to participate in goal setting Time For Goal Achievement: 09/07/24 Potential to Achieve Goals: Good ADL Goals Pt Will Perform Lower Body Bathing: with min assist;sit to/from stand;sitting/lateral leans Pt Will Perform Lower Body Dressing: with min assist;sitting/lateral leans;sit to/from stand Pt Will Transfer to Toilet: with contact guard assist;ambulating;regular height toilet Additional ADL Goal #1: Patient will be able to complete cognitive task without assist in order to return to prior level of function. Additional ADL Goal #2: Patient will be able to complete functional task in standing for 3 minutes prior to needing seated rest break in order to increase overall activity tolerance.  Plan      Co-evaluation                 AM-PAC OT 6 Clicks Daily Activity     Outcome  Measure   Help from another person eating meals?: A Little Help from another person taking care of personal grooming?: A Little Help from another person toileting, which includes using toliet, bedpan, or urinal?: A Lot Help from another person bathing (including washing, rinsing, drying)?: A Lot Help from another person to put on and taking off regular upper body clothing?: A Little Help from another person to put on and taking off regular lower body clothing?: A Lot 6 Click Score: 15    End of Session Equipment Utilized During Treatment: Gait belt;Rolling walker (2 wheels);Oxygen (2 liters)  OT Visit Diagnosis: Unsteadiness on feet (R26.81);Other abnormalities of gait and mobility (R26.89);Repeated falls (R29.6);Muscle weakness (generalized) (M62.81);History of falling (Z91.81);Other symptoms and signs involving cognitive function   Activity Tolerance Patient tolerated treatment well   Patient Left in chair;with call bell/phone within reach;with chair alarm set   Nurse Communication Mobility status        Time: 8983-8954 OT Time Calculation (min): 29 min  Charges:    Dick Laine, OTA Acute Rehabilitation Services  Office [phone]   Jeb LITTIE Laine 09/01/2024, 1:35 PM

## 2024-09-01 NOTE — TOC Progression Note (Signed)
 Transition of Care [facility]) - Progression Note    Patient Details  Name: Jose [name] MRN: [id number] Date of Birth: 08/30/1936  [facility]) CM/SW Contact  Inocente GORMAN Kindle, LCSW Phone Number: 09/01/2024, 4:30 PM  Clinical Narrative:    [facility] is able to accept patient tomorrow. CSW obtained insurance approval, Ref# [id number], effective 09/02/2024-09/04/2024.  CSW updated patient's daughter who requested PTAR for transport tomorrow.    Expected Discharge Plan: Skilled Nursing Facility Barriers to Discharge: SNF Pending bed offer               Expected Discharge Plan and Services In-house Referral: Clinical Social Work Discharge Planning Services: CM Consult Post Acute Care Choice: Durable Medical Equipment, Home Health Living arrangements for the past 2 months: Single Family Home                 DME Arranged: Bedside commode DME Agency: [employer] Date DME Agency Contacted: 08/25/24 Time DME Agency Contacted: [phone] Representative spoke with at DME Agency: Jermaine- to deliver to the home HH Arranged: PT, OT HH Agency: [facility] Date [facility] Agency Contacted: 08/25/24 Time HH Agency Contacted: [phone] Representative spoke with at [facility] Agency: Darleene   Social Drivers of Health (SDOH) Interventions SDOH Screenings   Food Insecurity: No Food Insecurity (08/24/2024)  Housing: Low Risk  (08/24/2024)  Transportation Needs: No Transportation Needs (08/24/2024)  Utilities: Not At Risk (08/24/2024)  Social Connections: Moderately Isolated (08/24/2024)  Tobacco Use: Low Risk  (08/23/2024)    Readmission Risk Interventions     No data to display

## 2024-09-01 NOTE — Plan of Care (Signed)

## 2024-09-01 NOTE — TOC Progression Note (Signed)
 Transition of Care [facility]) - Progression Note    Patient Details  Name: Jose [name] MRN: [id number] Date of Birth: 09/20/1936  [facility]) CM/SW Contact  Inocente GORMAN Kindle, LCSW Phone Number: 09/01/2024, 9:12 AM  Clinical Narrative:    9:12 AM-Awaiting response from [facility].    Expected Discharge Plan: Skilled Nursing Facility Barriers to Discharge: Continued Medical Work up, SNF Pending bed offer, English as a second language teacher               Expected Discharge Plan and Services   Discharge Planning Services: CM Consult Post Acute Care Choice: Durable Medical Equipment, Home Health Living arrangements for the past 2 months: Single Family Home                 DME Arranged: Bedside commode DME Agency: [employer] Date DME Agency Contacted: 08/25/24 Time DME Agency Contacted: [phone] Representative spoke with at DME Agency: Jermaine- to deliver to the home HH Arranged: PT, OT HH Agency: [facility] Date [facility] Agency Contacted: 08/25/24 Time HH Agency Contacted: 1621 Representative spoke with at [facility] Agency: Darleene   Social Drivers of Health (SDOH) Interventions SDOH Screenings   Food Insecurity: No Food Insecurity (08/24/2024)  Housing: Low Risk  (08/24/2024)  Transportation Needs: No Transportation Needs (08/24/2024)  Utilities: Not At Risk (08/24/2024)  Social Connections: Moderately Isolated (08/24/2024)  Tobacco Use: Low Risk  (08/23/2024)    Readmission Risk Interventions     No data to display

## 2024-09-01 NOTE — Progress Notes (Signed)
 PROGRESS NOTE        PATIENT DETAILS Name: Jose [name] Age: 88 y.o. Sex: male Date of Birth: 09-20-1936 Admit Date: 08/23/2024 Admitting Physician Terry LOISE Hurst, DO ERE:Emzcndu, [name], FNP  Brief Summary: Patient is a 88 y.o.  male with history of CAD s/p CABG, chronic combined CHF, HLD, HTN-who presented with weakness/fever/chills/poor oral intake-was found to have COVID-19 infection.  Significant events: 9/21>> admit to [facility] 9/26>> steroids/antibiotics started-chest x-ray with multifocal pneumonia-requiring around 4-5 L of oxygen.  Significant studies: 9/21>> CXR: No PNA 9/21>> CT head: No acute intracranial abnormality 9/21>> CT C-spine: No fracture 9/25>> CXR: Multifocal PNA 9/26>> echo: EF 45-50%, grade 1 diastolic dysfunction. 9/29>> B/L LE Doppler: No DVT  Significant microbiology data: 9/21>> COVID PCR: Positive 9/21>> influenza/RSV PCR: Negative  Procedures: None  Consults: None  Subjective: Stable-no major issues overnight-on 1-2 L of oxygen.  Objective: Vitals: Blood pressure (!) 146/74, pulse 79, temperature 97.8 F (36.6 C), temperature source Oral, resp. rate 19, height 5' 11 (1.803 m), weight 80.7 kg, SpO2 98%.   Exam: Gen Exam:Alert awake-not in any distress HEENT:atraumatic, normocephalic Chest: B/L clear to auscultation anteriorly CVS:S1S2 regular Abdomen:soft non tender, non distended Extremities:no edema Neurology: Non focal Skin: no rash  Pertinent Labs/Radiology:    Latest Ref Rng & Units 08/30/2024    6:28 AM 08/29/2024    5:23 AM 08/28/2024    6:01 AM  CBC  WBC 4.0 - 10.5 K/uL 11.6  3.6  4.7   Hemoglobin 13.0 - 17.0 g/dL 84.9  84.9  84.8   Hematocrit 39.0 - 52.0 % 42.6  43.7  44.3   Platelets 150 - 400 K/uL 224  162  153     Lab Results  Component Value Date   NA 139 08/31/2024   K 4.1 08/31/2024   CL 108 08/31/2024   CO2 24 08/31/2024     Assessment/Plan: Acute hypoxic respiratory failure  secondary to COVID-19 pneumonia-+/bacterial PNA  Stable-remains on 2 L of oxygen Taper prednisone  over the next several days Improved-Down to 2 L of oxygen CRP continues to downtrend Completed Zithromax  x 3 days on 9/28 Empiric Unasyn  x 5 days total Does not require diuretics-volumes are stable. Minimally elevated D-dimer-on prophylactic Lovenox -Dopplers negative for DVT-doubt further workup required.  Chronic diastolic/systolic heart failure Euvolemic-no further diuretics thought to be needed today. Echo stable-see above.  Hypokalemia Repleted.  CAD s/p CABG No anginal symptoms Continue aspirin /statin  Poor oral intake/failure to thrive syndrome Secondary to acute illness/COVID-19 infection Continue PT/OT eval-SNF recommended.    CKD stage IIIa Close to baseline.  HLD Statin/Zetia   HTN BP stable Amlodipine .  Code status:   Code Status: Full Code   DVT Prophylaxis: enoxaparin  (LOVENOX ) injection 40 mg Start: 08/23/24 2130   Family Communication: Daughter-[name] -updated over the phone 9/29   Disposition Plan: Status is: Inpatient Remains inpatient appropriate because: Severity of illness   Planned Discharge Destination:Skilled nursing facility   Diet: Diet Order             Diet regular Room service appropriate? No; Fluid consistency: Thin  Diet effective now                     Antimicrobial agents: Anti-infectives (From admission, onward)    Start     Dose/Rate Route Frequency Ordered Stop   08/28/24 0800  azithromycin  (ZITHROMAX ) 500 mg in sodium chloride  0.9 % 250 mL IVPB        500 mg 250 mL/hr over 60 Minutes Intravenous Every 24 hours 08/28/24 0653 08/30/24 1059   08/28/24 0800  Ampicillin -Sulbactam (UNASYN ) 3 g in sodium chloride  0.9 % 100 mL IVPB        3 g 200 mL/hr over 30 Minutes Intravenous Every 6 hours 08/28/24 0659 09/02/24 0759        MEDICATIONS: Scheduled Meds:  amLODipine   10 mg Oral Daily   aspirin  EC   81 mg Oral Daily   enoxaparin  (LOVENOX ) injection  40 mg Subcutaneous Q24H   ezetimibe   10 mg Oral Daily   feeding supplement  1 Container Oral TID BM   feeding supplement  237 mL Oral TID BM   guaiFENesin   600 mg Oral BID   pravastatin   10 mg Oral QHS   predniSONE   40 mg Oral Q breakfast   Continuous Infusions:  ampicillin -sulbactam (UNASYN ) IV 3 g (09/01/24 0835)   PRN Meds:.acetaminophen , acetaminophen , guaiFENesin -dextromethorphan , Ipratropium-Albuterol , melatonin, phenol, polyethylene glycol, prochlorperazine    I have personally reviewed following labs and imaging studies  LABORATORY DATA: CBC: Recent Labs  Lab 08/26/24 0320 08/28/24 0601 08/29/24 0523 08/30/24 0628  WBC 6.8 4.7 3.6* 11.6*  HGB 14.1 15.1 15.0 15.0  HCT 41.0 44.3 43.7 42.6  MCV 91.7 90.8 91.4 87.3  PLT 118* 153 162 224    Basic Metabolic Panel: Recent Labs  Lab 08/27/24 0356 08/28/24 0601 08/29/24 0523 08/30/24 0628 08/31/24 0504  NA 136 135 136 138 139  K 3.4* 3.4* 4.2 3.2* 4.1  CL 105 101 104 108 108  CO2 23 21* 18* 20* 24  GLUCOSE 122* 113* 230* 255* 171*  BUN 27* 21 29* 29* 25*  CREATININE 1.26* 0.94 1.07 1.05 1.03  CALCIUM 8.0* 8.2* 8.4* 8.6* 8.4*  MG  --   --   --   --  1.7    GFR: Estimated Creatinine Clearance: 54.8 mL/min (by C-G formula based on SCr of 1.03 mg/dL).  Liver Function Tests: No results for input(s): AST, ALT, ALKPHOS, BILITOT, PROT, ALBUMIN in the last 168 hours.  No results for input(s): LIPASE, AMYLASE in the last 168 hours. No results for input(s): AMMONIA in the last 168 hours.  Coagulation Profile: No results for input(s): INR, PROTIME in the last 168 hours.  Cardiac Enzymes: No results for input(s): CKTOTAL, CKMB, CKMBINDEX, TROPONINI in the last 168 hours.   BNP (last 3 results) No results for input(s): PROBNP in the last 8760 hours.  Lipid Profile: No results for input(s): CHOL, HDL, LDLCALC, TRIG,  CHOLHDL, LDLDIRECT in the last 72 hours.  Thyroid  Function Tests: No results for input(s): TSH, T4TOTAL, FREET4, T3FREE, THYROIDAB in the last 72 hours.  Anemia Panel: No results for input(s): VITAMINB12, FOLATE, FERRITIN, TIBC, IRON, RETICCTPCT in the last 72 hours.  Urine analysis:    Component Value Date/Time   COLORURINE YELLOW 09/13/2021 1431   APPEARANCEUR CLEAR 09/13/2021 1431   LABSPEC 1.013 09/13/2021 1431   PHURINE 6.0 09/13/2021 1431   GLUCOSEU NEGATIVE 09/13/2021 1431   HGBUR NEGATIVE 09/13/2021 1431   BILIRUBINUR NEGATIVE 09/13/2021 1431   BILIRUBINUR negative 09/12/2020 1122   KETONESUR NEGATIVE 09/13/2021 1431   PROTEINUR NEGATIVE 09/13/2021 1431   UROBILINOGEN 0.2 09/12/2020 1122   UROBILINOGEN 1.0 06/21/2010 1122   NITRITE NEGATIVE 09/13/2021 1431   LEUKOCYTESUR NEGATIVE 09/13/2021 1431    Sepsis Labs: Lactic Acid, Venous No results found for:  LATICACIDVEN  MICROBIOLOGY: Recent Results (from the past 240 hours)  Resp panel by RT-PCR (RSV, Flu A&B, Covid) Anterior Nasal Swab     Status: Abnormal   Collection Time: 08/23/24  4:41 PM   Specimen: Anterior Nasal Swab  Result Value Ref Range Status   SARS Coronavirus 2 by RT PCR POSITIVE (A) NEGATIVE Final   Influenza A by PCR NEGATIVE NEGATIVE Final   Influenza B by PCR NEGATIVE NEGATIVE Final    Comment: (NOTE) The Xpert Xpress SARS-CoV-2/FLU/RSV plus assay is intended as an aid in the diagnosis of influenza from Nasopharyngeal swab specimens and should not be used as a sole basis for treatment. Nasal washings and aspirates are unacceptable for Xpert Xpress SARS-CoV-2/FLU/RSV testing.  Fact Sheet for Patients: BloggerCourse.com  Fact Sheet for Healthcare Providers: SeriousBroker.it  This test is not yet approved or cleared by the [place]  FDA and has been authorized for detection and/or diagnosis of SARS-CoV-2 by FDA  under an Emergency Use Authorization (EUA). This EUA will remain in effect (meaning this test can be used) for the duration of the COVID-19 declaration under Section 564(b)(1) of the Act, 21 U.S.C. section 360bbb-3(b)(1), unless the authorization is terminated or revoked.     Resp Syncytial Virus by PCR NEGATIVE NEGATIVE Final    Comment: (NOTE) Fact Sheet for Patients: BloggerCourse.com  Fact Sheet for Healthcare Providers: SeriousBroker.it  This test is not yet approved or cleared by the [place]  FDA and has been authorized for detection and/or diagnosis of SARS-CoV-2 by FDA under an Emergency Use Authorization (EUA). This EUA will remain in effect (meaning this test can be used) for the duration of the COVID-19 declaration under Section 564(b)(1) of the Act, 21 U.S.C. section 360bbb-3(b)(1), unless the authorization is terminated or revoked.  Performed at [facility], [address] [place], [place] [address]   Culture, blood (Routine X 2) w Reflex to ID Panel     Status: None (Preliminary result)   Collection Time: 08/28/24 11:49 AM   Specimen: BLOOD  Result Value Ref Range Status   Specimen Description BLOOD SITE NOT SPECIFIED  Final   Special Requests   Final    BOTTLES DRAWN AEROBIC AND ANAEROBIC Blood Culture adequate volume   Culture   Final    NO GROWTH 4 DAYS Performed at [facility], [address] [place], [place] [address]    Report Status PENDING  Incomplete  Culture, blood (Routine X 2) w Reflex to ID Panel     Status: None (Preliminary result)   Collection Time: 08/28/24 11:49 AM   Specimen: BLOOD  Result Value Ref Range Status   Specimen Description BLOOD SITE NOT SPECIFIED  Final   Special Requests   Final    BOTTLES DRAWN AEROBIC AND ANAEROBIC Blood Culture adequate volume   Culture   Final    NO GROWTH 4 DAYS Performed at [facility], [address], [place], [place]  [address]    Report Status PENDING  Incomplete    RADIOLOGY STUDIES/RESULTS: VAS US  LOWER EXTREMITY VENOUS (DVT) Result Date: 08/31/2024  Lower Venous DVT Study Patient Name:  Jose [name]  Date of Exam:   08/31/2024 Medical Rec #: [id number]       Accession #:    [id number] Date of Birth: Apr 12, 1936       Patient Gender: M Patient Age:   34 years Exam Location:  [facility] Procedure:      VAS US  LOWER  EXTREMITY VENOUS (DVT) Referring Phys: DONALDA APPLEBAUM --------------------------------------------------------------------------------  Indications: Covid, elevated D-Dimer, and Edema.  Comparison Study: No prior study on file Performing Technologist: Alberta Lis RVS  Examination Guidelines: A complete evaluation includes B-mode imaging, spectral Doppler, color Doppler, and power Doppler as needed of all accessible portions of each vessel. Bilateral testing is considered an integral part of a complete examination. Limited examinations for reoccurring indications may be performed as noted. The reflux portion of the exam is performed with the patient in reverse Trendelenburg.  +---------+---------------+---------+-----------+----------+--------------+ RIGHT    CompressibilityPhasicitySpontaneityPropertiesThrombus Aging +---------+---------------+---------+-----------+----------+--------------+ CFV      Full           Yes      No                                  +---------+---------------+---------+-----------+----------+--------------+ SFJ      Full                                                        +---------+---------------+---------+-----------+----------+--------------+ FV Prox  Full           Yes      No                                  +---------+---------------+---------+-----------+----------+--------------+ FV Mid   Full                                                        +---------+---------------+---------+-----------+----------+--------------+ FV  DistalFull                                                        +---------+---------------+---------+-----------+----------+--------------+ PFV      Full                                                        +---------+---------------+---------+-----------+----------+--------------+ POP      Full           Yes      No                                  +---------+---------------+---------+-----------+----------+--------------+ PTV      Full                                                        +---------+---------------+---------+-----------+----------+--------------+ PERO     Full                                                        +---------+---------------+---------+-----------+----------+--------------+   +---------+---------------+---------+-----------+----------+--------------+  LEFT     CompressibilityPhasicitySpontaneityPropertiesThrombus Aging +---------+---------------+---------+-----------+----------+--------------+ CFV      Full           Yes      No                                  +---------+---------------+---------+-----------+----------+--------------+ SFJ      Full           Yes      No                                  +---------+---------------+---------+-----------+----------+--------------+ FV Prox  Full                                                        +---------+---------------+---------+-----------+----------+--------------+ FV Mid   Full                                                        +---------+---------------+---------+-----------+----------+--------------+ FV DistalFull                                                        +---------+---------------+---------+-----------+----------+--------------+ PFV      Full                                                        +---------+---------------+---------+-----------+----------+--------------+ POP      Full           Yes      Yes                                  +---------+---------------+---------+-----------+----------+--------------+ PTV      Full                                                        +---------+---------------+---------+-----------+----------+--------------+ PERO     Full                                                        +---------+---------------+---------+-----------+----------+--------------+ Gastroc  Full                                                        +---------+---------------+---------+-----------+----------+--------------+  Summary: RIGHT: - There is no evidence of deep vein thrombosis in the lower extremity.  - A cystic structure is found in the popliteal fossa.  LEFT: - There is no evidence of deep vein thrombosis in the lower extremity.  - No cystic structure found in the popliteal fossa.  *See table(s) above for measurements and observations. Electronically signed by Debby Robertson on 08/31/2024 at 5:05:42 PM.    Final       LOS: 8 days   Donalda Applebaum, MD  [facility]    To contact the attending provider between 7A-7P or the covering provider during after hours 7P-7A, please log into the web site [email or web address] and access using universal [place] password for that web site. If you do not have the password, please call the hospital operator.  09/01/2024, 10:06 AM

## 2024-09-01 NOTE — Plan of Care (Signed)

## 2024-09-02 DIAGNOSIS — U071 COVID-19: Secondary | ICD-10-CM | POA: Diagnosis not present

## 2024-09-02 DIAGNOSIS — R5383 Other fatigue: Secondary | ICD-10-CM | POA: Diagnosis not present

## 2024-09-02 DIAGNOSIS — W19XXXS Unspecified fall, sequela: Secondary | ICD-10-CM | POA: Diagnosis not present

## 2024-09-02 LAB — CULTURE, BLOOD (ROUTINE X 2)
Culture: NO GROWTH
Culture: NO GROWTH
Special Requests: ADEQUATE
Special Requests: ADEQUATE

## 2024-09-02 MED ORDER — ENSURE PLUS HIGH PROTEIN PO LIQD: 237.0000 mL | Freq: Three times a day (TID) | ORAL | Status: AC

## 2024-09-02 MED ORDER — GUAIFENESIN-DM 100-10 MG/5ML PO SYRP: 5.0000 mL | ORAL_SOLUTION | ORAL | Status: AC | PRN

## 2024-09-02 MED ORDER — MELATONIN 5 MG PO TABS: 5.0000 mg | ORAL_TABLET | Freq: Every day | ORAL | Status: AC

## 2024-09-02 MED ORDER — PREDNISONE 10 MG PO TABS: ORAL_TABLET | ORAL | Status: DC

## 2024-09-02 MED ORDER — POLYETHYLENE GLYCOL 3350 17 G PO PACK: 17.0000 g | PACK | Freq: Every day | ORAL | Status: AC | PRN

## 2024-09-02 MED ORDER — FUROSEMIDE 40 MG PO TABS: 20.0000 mg | ORAL_TABLET | Freq: Every day | ORAL | Status: AC | PRN

## 2024-09-02 MED ORDER — IPRATROPIUM-ALBUTEROL 0.5-2.5 (3) MG/3ML IN SOLN: 3.0000 mL | Freq: Four times a day (QID) | RESPIRATORY_TRACT | Status: AC | PRN

## 2024-09-02 NOTE — Progress Notes (Signed)
 Report given to [facility] LPN at [employer].

## 2024-09-02 NOTE — Plan of Care (Signed)

## 2024-09-02 NOTE — Discharge Summary (Signed)
 PATIENT DETAILS Name: Jose [name] Age: 88 y.o. Sex: male Date of Birth: 06-10-1936 MRN: [id number]. Admitting Physician: Terry LOISE Hurst, DO ERE:Emzcndu, [name], FNP  Admit Date: 08/23/2024 Discharge date: 09/02/2024  Recommendations for Outpatient Follow-up:  Follow up with PCP in 1-2 weeks Please obtain CMP/CBC in one week  Admitted From:  Home  Disposition: Skilled nursing facility   Discharge Condition: good  CODE STATUS:   Code Status: Full Code   Diet recommendation:  Diet Order             Diet - low sodium heart healthy           Diet regular Room service appropriate? No; Fluid consistency: Thin  Diet effective now                    Brief Summary: Patient is a 88 y.o.  male with history of CAD s/p CABG, chronic combined CHF, HLD, HTN-who presented with weakness/fever/chills/poor oral intake-was found to have COVID-19 infection.   Significant events: 9/21>> admit to [facility] 9/26>> steroids/antibiotics started-chest x-ray with multifocal pneumonia-requiring around 4-5 L of oxygen. 9/30>> on room air or just on 1-2 L of oxygen.   Significant studies: 9/21>> CXR: No PNA 9/21>> CT head: No acute intracranial abnormality 9/21>> CT C-spine: No fracture 9/25>> CXR: Multifocal PNA 9/26>> echo: EF 45-50%, grade 1 diastolic dysfunction. 9/29>> B/L LE Doppler: No DVT   Significant microbiology data: 9/21>> COVID PCR: Positive 9/21>> influenza/RSV PCR: Negative   Procedures: None   Consults: None  Brief Hospital Course: Acute hypoxic respiratory failure secondary to COVID-19 pneumonia-+/bacterial PNA  Significantly better-with steroids/antibiotics-add on room air or just on 1-2 L of oxygen (at 1 point required 4-5 L of O2 via HFNC) Has completed Unasyn  x 5 days total on 9/30 Completed Zithromax  x 3 days on 9/28 Taper prednisone  over the next several days Stable for discharge to SNF.  Doppler negative for VTE   Chronic diastolic/systolic heart  failure Euvolemic Echo stable-see above.   Hypokalemia Repleted.   CAD s/p CABG No anginal symptoms Continue aspirin /statin   Poor oral intake/failure to thrive syndrome Secondary to acute illness/COVID-19 infection Continue PT/OT eval-SNF recommended.     CKD stage IIIa Close to baseline.   HLD Statin/Zetia    HTN BP stable Amlodipine .  Discharge Diagnoses:  Principal Problem:   COVID-19 virus infection Active Problems:   Elevated serum creatinine   Fall   Fatigue   Oropharyngeal dysphagia   COVID-19   Discharge Instructions:  Activity:  As tolerated with Full fall precautions use walker/cane & assistance as needed  Discharge Instructions     Call MD for:  difficulty breathing, headache or visual disturbances   Complete by: As directed    Diet - low sodium heart healthy   Complete by: As directed    Increase activity slowly   Complete by: As directed       Allergies as of 09/02/2024       Reactions   Yellow Jacket Venom [bee Venom] Hives, Shortness Of Breath, Rash   Benicar [olmesartan]    Enlarged breasts   Cozaar [losartan] Other (See Comments)   Reaction unknown   Plavix [clopidogrel] Other (See Comments)   Reaction unknown   Relafen [nabumetone] Other (See Comments)   Unknown    Statins Other (See Comments)   unknown   Tekturna [aliskiren] Other (See Comments)   Reaction unknown    Atorvastatin Other (See Comments)   Ramipril Cough  Medication List     TAKE these medications    amLODipine  10 MG tablet Commonly known as: NORVASC  Take 10 mg by mouth daily.   aspirin  EC 81 MG tablet Take 81 mg by mouth daily.   ezetimibe  10 MG tablet Commonly known as: ZETIA  Take 1 tablet (10 mg total) by mouth daily.   feeding supplement Liqd Take 237 mLs by mouth 3 (three) times daily between meals.   furosemide  40 MG tablet Commonly known as: Lasix  Take 0.5 tablets (20 mg total) by mouth daily as needed for edema or fluid.    guaiFENesin -dextromethorphan  100-10 MG/5ML syrup Commonly known as: ROBITUSSIN DM Take 5 mLs by mouth every 4 (four) hours as needed for cough.   ipratropium-albuterol  0.5-2.5 (3) MG/3ML Soln Commonly known as: DUONEB Take 3 mLs by nebulization every 6 (six) hours as needed.   melatonin 5 MG Tabs Take 1 tablet (5 mg total) by mouth at bedtime.   polyethylene glycol 17 g packet Commonly known as: MIRALAX  / GLYCOLAX  Take 17 g by mouth daily as needed for mild constipation.   pravastatin  10 MG tablet Commonly known as: PRAVACHOL  Take 10 mg by mouth at bedtime.   predniSONE  10 MG tablet Commonly known as: DELTASONE  30 mg p.o. daily for 2 days, 20 mg p.o. daily for 2 days, 10 mg p.o. daily for 1 day and then stop Start taking on: September 03, 2024               Durable Medical Equipment  (From admission, onward)           Start     Ordered   08/25/24 1617  For home use only DME Bedside commode  Once       Question:  Patient needs a bedside commode to treat with the following condition  Answer:  Weakness   08/25/24 1616            Contact information for follow-up providers     [facility] Follow up.   Specialty: Home Health Services Why: for home health services Contact information: [address] [place] [address] [phone]         Royden [name], FNP. Schedule an appointment as soon as possible for a visit in 1 week(s).   Specialty: Internal Medicine Contact information: [address] [place] [address] [phone]              Contact information for after-discharge care     Destination     [facility], [place] .   Service: Skilled Nursing Contact information: [address] [place]  [phone]                    Allergies  Allergen Reactions   Yellow Jacket Venom [Bee Venom] Hives, Shortness Of Breath and Rash   Benicar  [Olmesartan]     Enlarged breasts   Cozaar [Losartan] Other (See Comments)    Reaction unknown   Plavix [Clopidogrel] Other (See Comments)    Reaction unknown   Relafen [Nabumetone] Other (See Comments)    Unknown    Statins Other (See Comments)    unknown   Tekturna [Aliskiren] Other (See Comments)    Reaction unknown    Atorvastatin Other (See Comments)   Ramipril Cough     Other Procedures/Studies: VAS US  LOWER EXTREMITY VENOUS (DVT) Result Date: 08/31/2024  Lower Venous DVT Study Patient Name:  Jose [name]  Date of Exam:   08/31/2024 Medical Rec #: [id number]       Accession #:    [id number] Date of Birth: February 28, 1936       Patient Gender: M Patient Age:   6 years Exam Location:  [facility] Procedure:      VAS US  LOWER EXTREMITY VENOUS (DVT) Referring Phys: DONALDA APPLEBAUM --------------------------------------------------------------------------------  Indications: Covid, elevated D-Dimer, and Edema.  Comparison Study: No prior study on file Performing Technologist: Alberta Lis RVS  Examination Guidelines: A complete evaluation includes B-mode imaging, spectral Doppler, color Doppler, and power Doppler as needed of all accessible portions of each vessel. Bilateral testing is considered an integral part of a complete examination. Limited examinations for reoccurring indications may be performed as noted. The reflux portion of the exam is performed with the patient in reverse Trendelenburg.  +---------+---------------+---------+-----------+----------+--------------+ RIGHT    CompressibilityPhasicitySpontaneityPropertiesThrombus Aging +---------+---------------+---------+-----------+----------+--------------+ CFV      Full           Yes      No                                  +---------+---------------+---------+-----------+----------+--------------+ SFJ      Full                                                         +---------+---------------+---------+-----------+----------+--------------+ FV Prox  Full           Yes      No                                  +---------+---------------+---------+-----------+----------+--------------+ FV Mid   Full                                                        +---------+---------------+---------+-----------+----------+--------------+ FV DistalFull                                                        +---------+---------------+---------+-----------+----------+--------------+ PFV      Full                                                        +---------+---------------+---------+-----------+----------+--------------+ POP      Full           Yes      No                                  +---------+---------------+---------+-----------+----------+--------------+ PTV      Full                                                        +---------+---------------+---------+-----------+----------+--------------+  PERO     Full                                                        +---------+---------------+---------+-----------+----------+--------------+   +---------+---------------+---------+-----------+----------+--------------+ LEFT     CompressibilityPhasicitySpontaneityPropertiesThrombus Aging +---------+---------------+---------+-----------+----------+--------------+ CFV      Full           Yes      No                                  +---------+---------------+---------+-----------+----------+--------------+ SFJ      Full           Yes      No                                  +---------+---------------+---------+-----------+----------+--------------+ FV Prox  Full                                                        +---------+---------------+---------+-----------+----------+--------------+ FV Mid   Full                                                         +---------+---------------+---------+-----------+----------+--------------+ FV DistalFull                                                        +---------+---------------+---------+-----------+----------+--------------+ PFV      Full                                                        +---------+---------------+---------+-----------+----------+--------------+ POP      Full           Yes      Yes                                 +---------+---------------+---------+-----------+----------+--------------+ PTV      Full                                                        +---------+---------------+---------+-----------+----------+--------------+ PERO     Full                                                        +---------+---------------+---------+-----------+----------+--------------+  Gastroc  Full                                                        +---------+---------------+---------+-----------+----------+--------------+     Summary: RIGHT: - There is no evidence of deep vein thrombosis in the lower extremity.  - A cystic structure is found in the popliteal fossa.  LEFT: - There is no evidence of deep vein thrombosis in the lower extremity.  - No cystic structure found in the popliteal fossa.  *See table(s) above for measurements and observations. Electronically signed by Debby Robertson on 08/31/2024 at 5:05:42 PM.    Final    DG Chest Port 1 View Result Date: 08/29/2024 CLINICAL DATA:  Shortness of breath. Follow-up probable multi lobar pneumonia on the right. EXAM: PORTABLE CHEST 1 VIEW COMPARISON:  08/27/2024 FINDINGS: Stable borderline enlarged cardiac silhouette. Tortuous and partially calcified thoracic aorta. Decreased patchy opacity in the right mid lung zone. Clear left lung. Stable post CABG changes. Unremarkable bones. IMPRESSION: Decreased right mid lung zone pneumonia. Electronically Signed   By: Elspeth Bathe M.D.   On: 08/29/2024 10:41    ECHOCARDIOGRAM COMPLETE Result Date: 08/28/2024    ECHOCARDIOGRAM REPORT   Patient Name:   Jose [name] Date of Exam: 08/28/2024 Medical Rec #:  [id number]      Height:       71.0 in Accession #:    [id number]     Weight:       176.4 lb Date of Birth:  05-Nov-1936      BSA:          1.999 m Patient Age:    88 years       BP:           93/64 mmHg Patient Gender: M              HR:           71 bpm. Exam Location:  Inpatient Procedure: 2D Echo (Both Spectral and Color Flow Doppler were utilized during            procedure). Indications:    acute respiratory distress  History:        Patient has prior history of Echocardiogram examinations, most                 recent 10/18/2021. Abnormal ECG and Prior CABG, Covid,                 Arrythmias:LBBB, Signs/Symptoms:Chest Pain; Risk                 Factors:Dyslipidemia and Hypertension.  Sonographer:    Tinnie Barefoot RDCS Referring Phys: LE[facility] M [facility]  Sonographer Comments: Image acquisition challenging due to respiratory motion. IMPRESSIONS  1. Left ventricular ejection fraction, by estimation, is 45 to 50%. The left ventricle has mildly decreased function. The left ventricle demonstrates global hypokinesis. Left ventricular diastolic parameters are consistent with Grade I diastolic dysfunction (impaired relaxation).  2. Right ventricular systolic function is normal. The right ventricular size is normal.  3. Left atrial size was moderately dilated.  4. The mitral valve is normal in structure. No evidence of mitral valve regurgitation. No evidence of mitral stenosis.  5. The aortic valve is tricuspid. Aortic valve regurgitation is mild. No aortic stenosis is present.  6.  Aortic dilatation noted. There is mild dilatation of the aortic root, measuring 40 mm. There is borderline dilatation of the ascending aorta, measuring 39 mm.  7. The inferior vena cava is normal in size with greater than 50% respiratory variability, suggesting right atrial pressure of 3  mmHg. FINDINGS  Left Ventricle: Left ventricular ejection fraction, by estimation, is 45 to 50%. The left ventricle has mildly decreased function. The left ventricle demonstrates global hypokinesis. The left ventricular internal cavity size was normal in size. There is  no left ventricular hypertrophy. Abnormal (paradoxical) septal motion, consistent with left bundle branch block. Left ventricular diastolic parameters are consistent with Grade I diastolic dysfunction (impaired relaxation). Right Ventricle: The right ventricular size is normal. No increase in right ventricular wall thickness. Right ventricular systolic function is normal. Left Atrium: Left atrial size was moderately dilated. Right Atrium: Right atrial size was normal in size. Pericardium: There is no evidence of pericardial effusion. Mitral Valve: The mitral valve is normal in structure. No evidence of mitral valve regurgitation. No evidence of mitral valve stenosis. Tricuspid Valve: The tricuspid valve is normal in structure. Tricuspid valve regurgitation is not demonstrated. No evidence of tricuspid stenosis. Aortic Valve: The aortic valve is tricuspid. Aortic valve regurgitation is mild. Aortic regurgitation PHT measures 690 msec. No aortic stenosis is present. Pulmonic Valve: The pulmonic valve was normal in structure. Pulmonic valve regurgitation is not visualized. No evidence of pulmonic stenosis. Aorta: Aortic dilatation noted. There is mild dilatation of the aortic root, measuring 40 mm. There is borderline dilatation of the ascending aorta, measuring 39 mm. Venous: The inferior vena cava is normal in size with greater than 50% respiratory variability, suggesting right atrial pressure of 3 mmHg. IAS/Shunts: No atrial level shunt detected by color flow Doppler.  LEFT VENTRICLE PLAX 2D LVIDd:         5.40 cm   Diastology LVIDs:         4.40 cm   LV e' medial:    3.70 cm/s LV PW:         1.10 cm   LV E/e' medial:  11.3 LV IVS:        1.10 cm    LV e' lateral:   7.83 cm/s LVOT diam:     2.20 cm   LV E/e' lateral: 5.4 LV SV:         65 LV SV Index:   33 LVOT Area:     3.80 cm  RIGHT VENTRICLE RV Basal diam:  2.70 cm TAPSE (M-mode): 1.8 cm LEFT ATRIUM              Index        RIGHT ATRIUM           Index LA diam:        3.90 cm  1.95 cm/m   RA Area:     16.30 cm LA Vol (A2C):   101.0 ml 50.53 ml/m  RA Volume:   39.80 ml  19.91 ml/m LA Vol (A4C):   62.6 ml  31.32 ml/m LA Biplane Vol: 80.0 ml  40.02 ml/m  AORTIC VALVE LVOT Vmax:   85.20 cm/s LVOT Vmean:  54.800 cm/s LVOT VTI:    0.172 m AI PHT:      690 msec  AORTA Ao Root diam: 4.00 cm Ao Asc diam:  3.90 cm MV E velocity: 41.90 cm/s  TRICUSPID VALVE MV A velocity: 80.50 cm/s  TR Peak grad:   0.0 mmHg MV E/A ratio:  0.52        TR Vmax:        11.10 cm/s                             SHUNTS                            Systemic VTI:  0.17 m                            Systemic Diam: 2.20 cm Oneil Parchment MD Electronically signed by Oneil Parchment MD Signature Date/Time: 08/28/2024/5:54:38 PM    Final    DG Chest Port 1V same Day Result Date: 08/27/2024 CLINICAL DATA:  Shortness of breath. EXAM: PORTABLE CHEST 1 VIEW COMPARISON:  08/23/2024. FINDINGS: Are new heterogeneous alveolar and interstitial opacities overlying the right lung with asymmetric more involvement of the lateral aspect of the right mid lung zone, favored to represent multilobar pneumonia. Follow-up to clearing is recommended. Bilateral lung fields are otherwise clear. Bilateral lateral costophrenic angles are clear. Stable cardio-mediastinal silhouette. There are surgical staples along the heart border and sternotomy wires, status post CABG (coronary artery bypass graft). No acute osseous abnormalities. The soft tissues are within normal limits. IMPRESSION: New heterogeneous alveolar and interstitial opacities overlying the right lung with asymmetric more involvement of the lateral aspect of the right mid lung zone, favored to represent  multilobar pneumonia. Follow-up to clearing is recommended. Electronically Signed   By: Ree Molt M.D.   On: 08/27/2024 16:04   CT Head Wo Contrast Result Date: 08/23/2024 EXAM: CT HEAD AND CERVICAL SPINE 08/23/2024 09:01:00 PM TECHNIQUE: CT of the head and cervical spine was performed without the administration of intravenous contrast. Multiplanar reformatted images are provided for review. Automated exposure control, iterative reconstruction, and/or weight based adjustment of the mA/kV was utilized to reduce the radiation dose to as low as reasonably achievable. COMPARISON: 09/13/2021 CLINICAL HISTORY: Fall, possibly on a blood thinner. S/p fall. C/o neck pain. 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 Confluent chronic ischemic white matter changes. Mild volume loss. No acute intracranial hemorrhage. No mass effect or midline shift. No abnormal extra-axial fluid collection. No hydrocephalus. ORBITS: No acute abnormality. SINUSES AND MASTOIDS: No acute abnormality. SOFT TISSUES AND SKULL: No acute skull fracture. No acute soft tissue abnormality. CT CERVICAL SPINE BONES AND ALIGNMENT: No acute fracture or traumatic malalignment. DEGENERATIVE CHANGES: No significant degenerative changes. SOFT TISSUES: No prevertebral soft tissue swelling. IMPRESSION: 1. No acute intracranial abnormality. 2. 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 3. No acute fracture or traumatic malalignment of the cervical spine. Electronically signed by: Franky Stanford MD 08/23/2024 09:12 PM EDT RP Workstation: [id number]   CT Cervical Spine Wo Contrast Result Date: 08/23/2024 EXAM: CT HEAD AND CERVICAL SPINE 08/23/2024 09:01:00 PM TECHNIQUE: CT of the head and cervical spine was performed without the administration of intravenous contrast. Multiplanar reformatted images are provided for review. Automated exposure control, iterative  reconstruction, and/or weight based adjustment of the mA/kV was utilized to reduce the radiation dose to as low as reasonably achievable. COMPARISON: 09/13/2021 CLINICAL HISTORY: Fall, possibly on a blood thinner. S/p fall. C/o neck pain. 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 Confluent chronic  ischemic white matter changes. Mild volume loss. No acute intracranial hemorrhage. No mass effect or midline shift. No abnormal extra-axial fluid collection. No hydrocephalus. ORBITS: No acute abnormality. SINUSES AND MASTOIDS: No acute abnormality. SOFT TISSUES AND SKULL: No acute skull fracture. No acute soft tissue abnormality. CT CERVICAL SPINE BONES AND ALIGNMENT: No acute fracture or traumatic malalignment. DEGENERATIVE CHANGES: No significant degenerative changes. SOFT TISSUES: No prevertebral soft tissue swelling. IMPRESSION: 1. No acute intracranial abnormality. 2. 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 3. No acute fracture or traumatic malalignment of the cervical spine. Electronically signed by: Franky Stanford MD 08/23/2024 09:12 PM EDT RP Workstation: [id number]   DG Chest 2 View Result Date: 08/23/2024 CLINICAL DATA:  Shortness of breath with cough EXAM: CHEST - 2 VIEW COMPARISON:  Chest x-ray 01/07/2017 FINDINGS: Patient is status post cardiac surgery. The heart size and mediastinal contours are within normal limits. Both lungs are clear. The visualized skeletal structures are unremarkable. IMPRESSION: No active cardiopulmonary disease. Electronically Signed   By: Greig Pique M.D.   On: 08/23/2024 18:19     TODAY-DAY OF DISCHARGE:  Subjective:   Jose [name] today has no headache,no chest abdominal pain,no new weakness tingling or numbness, feels much better wants to go home today.   Objective:   Blood pressure (!) 143/70, pulse 71, temperature 98.2 F (36.8 C), temperature source Oral, resp.  rate (!) 21, height 5' 11 (1.803 m), weight 80.7 kg, SpO2 92%.  Intake/Output Summary (Last 24 hours) at 09/02/2024 0902 Last data filed at 09/02/2024 0843 Gross per 24 hour  Intake --  Output 1350 ml  Net -1350 ml   Filed Weights   08/26/24 0500 08/27/24 0717 08/29/24 0400  Weight: 80.4 kg 80 kg 80.7 kg    Exam: Awake Alert, Oriented *3, No new F.N deficits, Normal affect [place].AT,PERRAL Supple Neck,No JVD, No cervical lymphadenopathy appriciated.  Symmetrical Chest wall movement, Good air movement bilaterally, CTAB RRR,No Gallops,Rubs or new Murmurs, No Parasternal Heave +ve B.Sounds, Abd Soft, Non tender, No organomegaly appriciated, No rebound -guarding or rigidity. No Cyanosis, Clubbing or edema, No new Rash or bruise   PERTINENT RADIOLOGIC STUDIES: VAS US  LOWER EXTREMITY VENOUS (DVT) Result Date: 08/31/2024  Lower Venous DVT Study Patient Name:  Jose [name]  Date of Exam:   08/31/2024 Medical Rec #: [id number]       Accession #:    [id number] Date of Birth: 05/17/1936       Patient Gender: M Patient Age:   6 years Exam Location:  [facility] Procedure:      VAS US  LOWER EXTREMITY VENOUS (DVT) Referring Phys: DONALDA APPLEBAUM --------------------------------------------------------------------------------  Indications: Covid, elevated D-Dimer, and Edema.  Comparison Study: No prior study on file Performing Technologist: Alberta Lis RVS  Examination Guidelines: A complete evaluation includes B-mode imaging, spectral Doppler, color Doppler, and power Doppler as needed of all accessible portions of each vessel. Bilateral testing is considered an integral part of a complete examination. Limited examinations for reoccurring indications may be performed as noted. The reflux portion of the exam is performed with the patient in reverse Trendelenburg.  +---------+---------------+---------+-----------+----------+--------------+ RIGHT     CompressibilityPhasicitySpontaneityPropertiesThrombus Aging +---------+---------------+---------+-----------+----------+--------------+ CFV      Full           Yes      No                                  +---------+---------------+---------+-----------+----------+--------------+  SFJ      Full                                                        +---------+---------------+---------+-----------+----------+--------------+ FV Prox  Full           Yes      No                                  +---------+---------------+---------+-----------+----------+--------------+ FV Mid   Full                                                        +---------+---------------+---------+-----------+----------+--------------+ FV DistalFull                                                        +---------+---------------+---------+-----------+----------+--------------+ PFV      Full                                                        +---------+---------------+---------+-----------+----------+--------------+ POP      Full           Yes      No                                  +---------+---------------+---------+-----------+----------+--------------+ PTV      Full                                                        +---------+---------------+---------+-----------+----------+--------------+ PERO     Full                                                        +---------+---------------+---------+-----------+----------+--------------+   +---------+---------------+---------+-----------+----------+--------------+ LEFT     CompressibilityPhasicitySpontaneityPropertiesThrombus Aging +---------+---------------+---------+-----------+----------+--------------+ CFV      Full           Yes      No                                  +---------+---------------+---------+-----------+----------+--------------+ SFJ      Full           Yes      No                                   +---------+---------------+---------+-----------+----------+--------------+  FV Prox  Full                                                        +---------+---------------+---------+-----------+----------+--------------+ FV Mid   Full                                                        +---------+---------------+---------+-----------+----------+--------------+ FV DistalFull                                                        +---------+---------------+---------+-----------+----------+--------------+ PFV      Full                                                        +---------+---------------+---------+-----------+----------+--------------+ POP      Full           Yes      Yes                                 +---------+---------------+---------+-----------+----------+--------------+ PTV      Full                                                        +---------+---------------+---------+-----------+----------+--------------+ PERO     Full                                                        +---------+---------------+---------+-----------+----------+--------------+ Gastroc  Full                                                        +---------+---------------+---------+-----------+----------+--------------+     Summary: RIGHT: - There is no evidence of deep vein thrombosis in the lower extremity.  - A cystic structure is found in the popliteal fossa.  LEFT: - There is no evidence of deep vein thrombosis in the lower extremity.  - No cystic structure found in the popliteal fossa.  *See table(s) above for measurements and observations. Electronically signed by Debby Robertson on 08/31/2024 at 5:05:42 PM.    Final      PERTINENT LAB RESULTS: CBC: No results for input(s): WBC, HGB, HCT, PLT in the last 72 hours. CMET CMP     Component Value Date/Time   NA 139 08/31/2024 0504   K  4.1 08/31/2024 0504   CL 108 08/31/2024  0504   CO2 24 08/31/2024 0504   GLUCOSE 171 (H) 08/31/2024 0504   BUN 25 (H) 08/31/2024 0504   CREATININE 1.03 08/31/2024 0504   CALCIUM 8.4 (L) 08/31/2024 0504   PROT 7.1 08/23/2024 1715   ALBUMIN 3.6 08/23/2024 1715   AST 37 08/23/2024 1715   ALT 17 08/23/2024 1715   ALKPHOS 47 08/23/2024 1715   BILITOT 0.9 08/23/2024 1715   EGFR 64.0 10/24/2023 1316   GFRNONAA >60 08/31/2024 0504   GFRNONAA 61 04/22/2024 0000    GFR Estimated Creatinine Clearance: 54.8 mL/min (by C-G formula based on SCr of 1.03 mg/dL). No results for input(s): LIPASE, AMYLASE in the last 72 hours. No results for input(s): CKTOTAL, CKMB, CKMBINDEX, TROPONINI in the last 72 hours. Invalid input(s): POCBNP No results for input(s): DDIMER in the last 72 hours. No results for input(s): HGBA1C in the last 72 hours. No results for input(s): CHOL, HDL, LDLCALC, TRIG, CHOLHDL, LDLDIRECT in the last 72 hours. No results for input(s): TSH, T4TOTAL, T3FREE, THYROIDAB in the last 72 hours.  Invalid input(s): FREET3 No results for input(s): VITAMINB12, FOLATE, FERRITIN, TIBC, IRON, RETICCTPCT in the last 72 hours. Coags: No results for input(s): INR in the last 72 hours.  Invalid input(s): PT Microbiology: Recent Results (from the past 240 hours)  Resp panel by RT-PCR (RSV, Flu A&B, Covid) Anterior Nasal Swab     Status: Abnormal   Collection Time: 08/23/24  4:41 PM   Specimen: Anterior Nasal Swab  Result Value Ref Range Status   SARS Coronavirus 2 by RT PCR POSITIVE (A) NEGATIVE Final   Influenza A by PCR NEGATIVE NEGATIVE Final   Influenza B by PCR NEGATIVE NEGATIVE Final    Comment: (NOTE) The Xpert Xpress SARS-CoV-2/FLU/RSV plus assay is intended as an aid in the diagnosis of influenza from Nasopharyngeal swab specimens and should not be used as a sole basis for treatment. Nasal washings and aspirates are unacceptable for Xpert Xpress  SARS-CoV-2/FLU/RSV testing.  Fact Sheet for Patients: BloggerCourse.com  Fact Sheet for Healthcare Providers: SeriousBroker.it  This test is not yet approved or cleared by the [place]  FDA and has been authorized for detection and/or diagnosis of SARS-CoV-2 by FDA under an Emergency Use Authorization (EUA). This EUA will remain in effect (meaning this test can be used) for the duration of the COVID-19 declaration under Section 564(b)(1) of the Act, 21 U.S.C. section 360bbb-3(b)(1), unless the authorization is terminated or revoked.     Resp Syncytial Virus by PCR NEGATIVE NEGATIVE Final    Comment: (NOTE) Fact Sheet for Patients: BloggerCourse.com  Fact Sheet for Healthcare Providers: SeriousBroker.it  This test is not yet approved or cleared by the [place]  FDA and has been authorized for detection and/or diagnosis of SARS-CoV-2 by FDA under an Emergency Use Authorization (EUA). This EUA will remain in effect (meaning this test can be used) for the duration of the COVID-19 declaration under Section 564(b)(1) of the Act, 21 U.S.C. section 360bbb-3(b)(1), unless the authorization is terminated or revoked.  Performed at [facility], [address] [place], [place] [address]   Culture, blood (Routine X 2) w Reflex to ID Panel     Status: None   Collection Time: 08/28/24 11:49 AM   Specimen: BLOOD  Result Value Ref Range Status   Specimen Description BLOOD SITE NOT SPECIFIED  Final   Special Requests   Final    BOTTLES DRAWN AEROBIC AND ANAEROBIC  Blood Culture adequate volume   Culture   Final    NO GROWTH 5 DAYS Performed at [facility], [address], [place], [place] [address]    Report Status 09/02/2024 FINAL  Final  Culture, blood (Routine X 2) w Reflex to ID Panel     Status: None   Collection Time: 08/28/24 11:49 AM   Specimen: BLOOD   Result Value Ref Range Status   Specimen Description BLOOD SITE NOT SPECIFIED  Final   Special Requests   Final    BOTTLES DRAWN AEROBIC AND ANAEROBIC Blood Culture adequate volume   Culture   Final    NO GROWTH 5 DAYS Performed at [facility], [address], [place], [place] [address]    Report Status 09/02/2024 FINAL  Final    FURTHER DISCHARGE INSTRUCTIONS:  Get Medicines reviewed and adjusted: Please take all your medications with you for your next visit with your Primary MD  Laboratory/radiological data: Please request your Primary MD to go over all hospital tests and procedure/radiological results at the follow up, please ask your Primary MD to get all Hospital records sent to his/her office.  In some cases, they will be blood work, cultures and biopsy results pending at the time of your discharge. Please request that your primary care M.D. goes through all the records of your hospital data and follows up on these results.  Also Note the following: If you experience worsening of your admission symptoms, develop shortness of breath, life threatening emergency, suicidal or homicidal thoughts you must seek medical attention immediately by calling 911 or calling your MD immediately  if symptoms less severe.  You must read complete instructions/literature along with all the possible adverse reactions/side effects for all the Medicines you take and that have been prescribed to you. Take any new Medicines after you have completely understood and accpet all the possible adverse reactions/side effects.   Do not drive when taking Pain medications or sleeping medications (Benzodaizepines)  Do not take more than prescribed Pain, Sleep and Anxiety Medications. It is not advisable to combine anxiety,sleep and pain medications without talking with your primary care practitioner  Special Instructions: If you have smoked or chewed Tobacco  in the last 2 yrs please stop smoking, stop any  regular Alcohol  and or any Recreational drug use.  Wear Seat belts while driving.  Please note: You were cared for by a hospitalist during your hospital stay. Once you are discharged, your primary care physician will handle any further medical issues. Please note that NO REFILLS for any discharge medications will be authorized once you are discharged, as it is imperative that you return to your primary care physician (or establish a relationship with a primary care physician if you do not have one) for your post hospital discharge needs so that they can reassess your need for medications and monitor your lab values.  Total Time spent coordinating discharge including counseling, education and face to face time equals greater than 30 minutes.  Signed: Yotam Rhine 09/02/2024 9:02 AM

## 2024-09-02 NOTE — Progress Notes (Signed)
 Physical Therapy Treatment Patient Details Name: Jose [name] MRN: [id number] DOB: [birth year] Today's Date: 09/02/2024   History of Present Illness 88 y.o. male presents to [facility] hospital on 08/23/2024 with generalized weakness, found to be positive for COVID. PMH includes CABG, HLD, HTN, CHF.    PT Comments  Pt received in supine, alert and confused, asking for assist but unable to explain what he needs assist with, most of breakfast tray untouched and pt defers additional bites or sips when prompted. Pt agreeable to therapy session with encouragement, with emphasis on seated and standing BLE exercises for strengthening and safety with transfers using RW which pt performed with up to minA and mod cues for body mechanics and safety. SpO2 WFL on 2L O2 [place] in chair posture and chair alarm on for pt safety. Patient will benefit from continued inpatient follow up therapy, <3 hours/day.    Vital Signs  Resp 16  BP 129/71  BP Location Left Arm  BP Method Automatic  Patient Position (if appropriate) Sitting (EOB prior to transfer to chair)  Oxygen Therapy  SpO2 93 %  O2 Device Nasal Cannula   Vital Signs  Pulse Rate 79 (tele monitor reading brief spike of HR to 170's after sitting in chair for 2 mins, but quickly decreased to <100 bpm, pt c/o mid-back discomfort but not until prompted for symptoms by PTA. No other c/o.)  Pulse Rate Source Monitor  Resp 18  BP 123/69  BP Location Left Arm  BP Method Automatic  Patient Position (if appropriate) Sitting (in recliner)      If plan is discharge home, recommend the following: Assist for transportation;A lot of help with walking and/or transfers;Help with stairs or ramp for entrance;Assistance with cooking/housework;A lot of help with bathing/dressing/bathroom;Supervision due to cognitive status   Can travel by private vehicle     Yes  Equipment Recommendations  BSC/3in1    Recommendations for Other Services       Precautions /  Restrictions Precautions Precautions: Fall Recall of Precautions/Restrictions: Impaired Precaution/Restrictions Comments: COVID+; watch BP Restrictions Weight Bearing Restrictions Per Provider Order: No     Mobility  Bed Mobility Overal bed mobility: Needs Assistance Bed Mobility: Supine to Sit     Supine to sit: Used rails, HOB elevated, Min assist     General bed mobility comments: increased time and cues to initiate, pt pulling well on R bed rail to advance hips/body to R, but when cued to sit up, pt keeping feet up on bed in sidelying so unable to perform without tactile/verbal cues and minA to advance his legs over EOB, then minA to continue trunk raising along with bed rail assist. Once EOB, pt scooting forward with CGA/minA to foot flat with cues and bed pad assist. No dizziness reported seated EOB.    Transfers Overall transfer level: Needs assistance Equipment used: Rolling walker (2 wheels) Transfers: Sit to/from Stand Sit to Stand: Min assist           General transfer comment: min assist to power up from EOB, cues for safer UE placement and body mechanics, pt needing a couple mins to prepare himself for transfer.    Ambulation/Gait Ambulation/Gait assistance: Min assist Gait Distance (Feet): 4 Feet Assistive device: Rolling walker (2 wheels) Gait Pattern/deviations: Step-to pattern, Trunk flexed, Narrow base of support, Shuffle     Pre-gait activities: standing hip flexion x10 reps with visual cue for lifting knees higher on each leg. SpO2 WFL on 2L O2  General Gait  Details: stepping toward chiar on his R from EOB ~57ft, then able to remain standing and performed pre-gait exercises as below   Stairs             Wheelchair Mobility     Tilt Bed    Modified Rankin (Stroke Patients Only)       Balance Overall balance assessment: Needs assistance Sitting-balance support: Feet supported, Single extremity supported, No upper extremity  supported Sitting balance-Leahy Scale: Fair Sitting balance - Comments: EOB   Standing balance support: Bilateral upper extremity supported Standing balance-Leahy Scale: Poor Standing balance comment: RW reliant                            Communication Communication Communication: Impaired Factors Affecting Communication: Hearing impaired;Difficulty expressing self  Cognition Arousal: Alert Behavior During Therapy: WFL for tasks assessed/performed, Flat affect   PT - Cognitive impairments: No family/caregiver present to determine baseline, Safety/Judgement, Problem solving, Attention, Initiation, Sequencing, Orientation, Awareness, Memory   Orientation impairments: Situation, Time                   PT - Cognition Comments: When PTA arrived, pt had breakfast tray on table and stated I can't get this organized, PTA offered to assist him with cutting up bites of food to eat or opening drinks to set up, but pt reports he is not hungry, so tray removed. Unclear if pt was wanting help with line mgmt and frustrated wtih heart monitor/pulse ox lines or just confused. Pt with decreased comprehension of situation, HoH and needs multiple cues at times to carry over information, BP stable seated pre/post transfer but did not assess supine prior to pt sitting EOB so may have been mildly orthostatic. Decreased insight into his activity tolerance. Chair alarm on/activated for safety at end of session. Following commands: Impaired Following commands impaired: Only follows one step commands consistently, Follows multi-step commands inconsistently, Follows one step commands with increased time    Cueing Cueing Techniques: Verbal cues, Gestural cues  Exercises Other Exercises Other Exercises: standing hip flexion and heel raises (bil) x10 reps ea at RW    General Comments General comments (skin integrity, edema, etc.): SpO2 89-94% on 2L O2 [place] with exertional tasks, HR mostly 70's-80's  bpm with activity, but after pt sat in chair 1-2 mins, brief HR spike to 179 bpm a couple seconds, then decreased to <100 bpm after ~5 seconds, RN notified who subsequently notified Attending MD. Pt questioned at time of HR change to see if he had any symptoms, pt only c/o mid-back pain when asked and reports it felt better with pillow behind back and chair reclined, no other c/o. Chair alarm on for pt safety, call bell in his lap, PTA defer additional exercises/gait tasks at that time for pt safety.      Pertinent Vitals/Pain Pain Assessment Pain Assessment: Faces Faces Pain Scale: Hurts little more Pain Location: mid-back pain sitting up in chair, generalized c/o discomfort prior to OOB transfer Pain Descriptors / Indicators: Grimacing, Discomfort Pain Intervention(s): Limited activity within patient's tolerance, Monitored during session, Repositioned    Home Living                          Prior Function            PT Goals (current goals can now be found in the care plan section) Acute Rehab PT Goals Patient Stated  Goal: to reduce falls risk PT Goal Formulation: With patient (slowly) Time For Goal Achievement: 09/07/24 Progress towards PT goals: Progressing toward goals    Frequency    Min 2X/week      PT Plan      Co-evaluation              AM-PAC PT 6 Clicks Mobility   Outcome Measure  Help needed turning from your back to your side while in a flat bed without using bedrails?: A Little Help needed moving from lying on your back to sitting on the side of a flat bed without using bedrails?: A Lot (mod cues) Help needed moving to and from a bed to a chair (including a wheelchair)?: A Little Help needed standing up from a chair using your arms (e.g., wheelchair or bedside chair)?: A Lot Help needed to walk in hospital room?: A Lot Help needed climbing 3-5 steps with a railing? : Total 6 Click Score: 13    End of Session Equipment Utilized During  Treatment: Gait belt;Oxygen Activity Tolerance: Patient tolerated treatment well;Other (comment) (spike in HR after standing exercises while pt resting in chair, so defer additional gait or standing tasks, RN/MD aware.) Patient left: in chair;with call bell/phone within reach;with chair alarm set;Other (comment) (reclined, supplement Ensure on table in front of him, pt encouraged to consume since he didn't eat much of his breakfast) Nurse Communication: Mobility status;Precautions;Other (comment) (brief tachycardia) PT Visit Diagnosis: Other abnormalities of gait and mobility (R26.89);Muscle weakness (generalized) (M62.81);History of falling (Z91.81)     Time: 8965-8898 PT Time Calculation (min) (ACUTE ONLY): 27 min  Charges:    $Therapeutic Exercise: 8-22 mins $Therapeutic Activity: 8-22 mins PT General Charges $$ ACUTE PT VISIT: 1 Visit                     Danilyn Cocke P., PTA Acute Rehabilitation Services Secure Chat Preferred 9a-5:30pm Office: [phone]    Connell [facility] 09/02/2024, 11:50 AM

## 2024-09-02 NOTE — TOC Transition Note (Signed)
 Transition of Care [facility]) - Discharge Note   Patient Details  Name: Jose [name] MRN: [id number] Date of Birth: 1936-09-22  Transition of Care [facility]) CM/SW Contact:  Inocente GORMAN Kindle, LCSW Phone Number: 09/02/2024, 11:52 AM   Clinical Narrative:    Patient will DC to: [facility] Anticipated DC date: 09/02/24 Family notified: Daughter, Web designer by: ROME   Per MD patient ready for DC to [facility]. RN to call report prior to discharge [phone] room [phone] ). RN, patient, patient's family, and facility notified of DC. Discharge Summary and FL2 sent to facility. DC packet on chart. Ambulance transport requested for patient.   CSW will sign off for now as social work intervention is no longer needed. Please consult us  again if new needs arise.     Final next level of care: Skilled Nursing Facility Barriers to Discharge: Barriers Resolved   Patient Goals and CMS Choice Patient states their goals for this hospitalization and ongoing recovery are:: to go home CMS Medicare.gov Compare Post Acute Care list provided to:: Patient Choice offered to / list presented to : Patient, Adult Children      Discharge Placement   Existing PASRR number confirmed : 09/02/24          Patient chooses bed at: [facility] [place] Patient to be transferred to facility by: [facility] Name of family member notified: Daughter Patient and family notified of of transfer: 09/02/24  Discharge Plan and Services Additional resources added to the After Visit Summary for   In-house Referral: Clinical Social Work Discharge Planning Services: CM Consult Post Acute Care Choice: Horticulturist, commercial, Home Health          DME Arranged: Bedside commode DME Agency: [employer] Date DME Agency Contacted: 08/25/24 Time DME Agency Contacted: [phone] Representative spoke with at DME Agency: [place]- to deliver to the home HH Arranged: PT, OT HH Agency: [facility] Date [facility] Agency  Contacted: 08/25/24 Time HH Agency Contacted: 1621 Representative spoke with at [facility] Agency: Darleene  Social Drivers of Health (SDOH) Interventions SDOH Screenings   Food Insecurity: No Food Insecurity (08/24/2024)  Housing: Low Risk  (08/24/2024)  Transportation Needs: No Transportation Needs (08/24/2024)  Utilities: Not At Risk (08/24/2024)  Social Connections: Moderately Isolated (08/24/2024)  Tobacco Use: Low Risk  (08/23/2024)     Readmission Risk Interventions     No data to display

## 2024-09-02 NOTE — TOC Progression Note (Signed)
 Transition of Care [facility]) - Progression Note    Patient Details  Name: Jose [name] MRN: [id number] Date of Birth: 08-09-1936  Transition of Care [facility]) CM/SW Contact  Inocente GORMAN Kindle, LCSW Phone Number: 09/02/2024, 9:25 AM  Clinical Narrative:    CSW sent DC Summary to [facility] and updated patient's daughter.    Expected Discharge Plan: Skilled Nursing Facility Barriers to Discharge: Barriers Resolved               Expected Discharge Plan and Services In-house Referral: Clinical Social Work Discharge Planning Services: CM Consult Post Acute Care Choice: Durable Medical Equipment, Home Health Living arrangements for the past 2 months: Single Family Home Expected Discharge Date: 09/02/24               DME Arranged: Bedside commode DME Agency: [employer] Date DME Agency Contacted: 08/25/24 Time DME Agency Contacted: [phone] Representative spoke with at DME Agency: [place]- to deliver to the home HH Arranged: PT, OT HH Agency: [facility] Date [facility] Agency Contacted: 08/25/24 Time HH Agency Contacted: 1621 Representative spoke with at [facility] Agency: Darleene   Social Drivers of Health (SDOH) Interventions SDOH Screenings   Food Insecurity: No Food Insecurity (08/24/2024)  Housing: Low Risk  (08/24/2024)  Transportation Needs: No Transportation Needs (08/24/2024)  Utilities: Not At Risk (08/24/2024)  Social Connections: Moderately Isolated (08/24/2024)  Tobacco Use: Low Risk  (08/23/2024)    Readmission Risk Interventions     No data to display

## 2024-09-06 DIAGNOSIS — I2510 Atherosclerotic heart disease of native coronary artery without angina pectoris: Secondary | ICD-10-CM | POA: Diagnosis not present

## 2024-09-13 DIAGNOSIS — J9601 Acute respiratory failure with hypoxia: Secondary | ICD-10-CM | POA: Diagnosis not present

## 2024-10-09 ENCOUNTER — Emergency Department (HOSPITAL_BASED_OUTPATIENT_CLINIC_OR_DEPARTMENT_OTHER): Admitting: Radiology

## 2024-10-09 ENCOUNTER — Emergency Department (HOSPITAL_BASED_OUTPATIENT_CLINIC_OR_DEPARTMENT_OTHER)
Admission: EM | Admit: 2024-10-09 | Discharge: 2024-10-09 | Discharge status: Against Medical Advice | Source: Ambulatory Visit | Attending: Emergency Medicine | Admitting: Emergency Medicine

## 2024-10-09 ENCOUNTER — Other Ambulatory Visit: Payer: Self-pay

## 2024-10-09 ENCOUNTER — Encounter (HOSPITAL_BASED_OUTPATIENT_CLINIC_OR_DEPARTMENT_OTHER): Payer: Self-pay

## 2024-10-09 ENCOUNTER — Emergency Department (HOSPITAL_BASED_OUTPATIENT_CLINIC_OR_DEPARTMENT_OTHER)

## 2024-10-09 DIAGNOSIS — I10 Essential (primary) hypertension: Secondary | ICD-10-CM | POA: Diagnosis not present

## 2024-10-09 DIAGNOSIS — Z85828 Personal history of other malignant neoplasm of skin: Secondary | ICD-10-CM | POA: Insufficient documentation

## 2024-10-09 DIAGNOSIS — I2510 Atherosclerotic heart disease of native coronary artery without angina pectoris: Secondary | ICD-10-CM | POA: Insufficient documentation

## 2024-10-09 DIAGNOSIS — J849 Interstitial pulmonary disease, unspecified: Secondary | ICD-10-CM | POA: Diagnosis not present

## 2024-10-09 DIAGNOSIS — R0602 Shortness of breath: Secondary | ICD-10-CM | POA: Diagnosis present

## 2024-10-09 DIAGNOSIS — Z7982 Long term (current) use of aspirin: Secondary | ICD-10-CM | POA: Insufficient documentation

## 2024-10-09 DIAGNOSIS — Z79899 Other long term (current) drug therapy: Secondary | ICD-10-CM | POA: Diagnosis not present

## 2024-10-09 DIAGNOSIS — M25562 Pain in left knee: Secondary | ICD-10-CM | POA: Insufficient documentation

## 2024-10-09 DIAGNOSIS — Z5329 Procedure and treatment not carried out because of patient's decision for other reasons: Secondary | ICD-10-CM | POA: Insufficient documentation

## 2024-10-09 LAB — CBC
HCT: 38.2 % — ABNORMAL LOW (ref 39.0–52.0)
Hemoglobin: 12.5 g/dL — ABNORMAL LOW (ref 13.0–17.0)
MCH: 30.5 pg (ref 26.0–34.0)
MCHC: 32.7 g/dL (ref 30.0–36.0)
MCV: 93.2 fL (ref 80.0–100.0)
Platelets: 263 K/uL (ref 150–400)
RBC: 4.1 MIL/uL — ABNORMAL LOW (ref 4.22–5.81)
RDW: 13.5 % (ref 11.5–15.5)
WBC: 8.6 K/uL (ref 4.0–10.5)
nRBC: 0 % (ref 0.0–0.2)

## 2024-10-09 LAB — BASIC METABOLIC PANEL WITH GFR
Anion gap: 8 (ref 5–15)
BUN: 16 mg/dL (ref 8–23)
CO2: 26 mmol/L (ref 22–32)
Calcium: 9.6 mg/dL (ref 8.9–10.3)
Chloride: 105 mmol/L (ref 98–111)
Creatinine, Ser: 0.85 mg/dL (ref 0.61–1.24)
GFR, Estimated: >60 mL/min (ref >60)
Glucose, Bld: 147 mg/dL — ABNORMAL HIGH (ref 70–99)
Potassium: 3.9 mmol/L (ref 3.5–5.1)
Sodium: 139 mmol/L (ref 135–145)

## 2024-10-09 LAB — TROPONIN T, HIGH SENSITIVITY
Troponin T High Sensitivity: 38 ng/L — ABNORMAL HIGH (ref 0–19)
Troponin T High Sensitivity: 37 ng/L — ABNORMAL HIGH (ref 0–19)

## 2024-10-09 LAB — RESP PANEL BY RT-PCR (RSV, FLU A&B, COVID)  RVPGX2
Influenza A by PCR: NEGATIVE
Influenza B by PCR: NEGATIVE
Resp Syncytial Virus by PCR: NEGATIVE
SARS Coronavirus 2 by RT PCR: NEGATIVE

## 2024-10-09 LAB — OCCULT BLOOD X 1 CARD TO LAB, STOOL: Fecal Occult Bld: NEGATIVE

## 2024-10-09 LAB — D-DIMER, QUANTITATIVE: D-Dimer, Quant: 0.6 ug{FEU}/mL — ABNORMAL HIGH (ref 0.00–0.50)

## 2024-10-09 NOTE — ED Notes (Signed)
 Patient accompanied by daughter. The patient's daughter expressed frustration regarding the long wait times experienced upon entering the room. While this writer was assisting the patient with undressing, there was a knock on the door. It was the CT technician. This clinical research associate stated, "Maybe they are getting you for a chest X-ray," not realizing the patient had already completed a chest X-ray earlier. The patient's daughter raised her voice, stating, "They already did an X-ray; you're just repeating unnecessary tests." This writer attempted to get the pt's daughter to lower her voice and she stated, this isn't yelling, you don't want to hear me yell This writer immediately apologized, explained that the comment was a misspoken assumption, and clarified that the patient was going for a CT scan, not another chest X-ray. This situation was de-escalated after clarification. Patient was transported for CT scan shortly after.

## 2024-10-09 NOTE — ED Triage Notes (Signed)
 Patient reports shortness of breath. Family says he was hospitalized for COVID pneumonia. Patient thinks he was given a COVID vaccine and this caused it as well. He also reports left knee pain.

## 2024-10-09 NOTE — ED Notes (Signed)
 PT seen walking out with family member by this triage tech., Damien RN stopped them to get IV out.

## 2024-10-09 NOTE — ED Notes (Signed)
 Patient climbing out of bed. This RN went to discuss with patient. Patient states I am going to leave and go to [facility] because they would have me in and out of here much faster than this. This RN explained that testing is in process but has not resulted yet and there are many risks to leaving before completing care. This RN inquired to see if they would wait to discuss with provider. Patient and family agree. This RN exited room to inform PA.

## 2024-10-09 NOTE — ED Notes (Signed)
 This RN and Jordan. NT entered room to introduce self to patient and obtain viral swab. This RN explained to patient that the provider wanted to obtain a viral swab to test for multiple different viruses. Patient's daughter became frustrated and began refusing viral swab on behalf of patient. Daughter states she is frustrated because he just had covid so there is no way he has it again. This RN explained it tests for multiple different viruses. Patient states That's okay. You can do the swab This RN swabbed patient at this time. Patient's daughter became increasingly agitated and began reporting multiple complaints about wait time and her having a headache. Patient upset with his daughter and tells her to Shut up This RN attempted to verbally deescalate situation and explained the triage process and different testing processes done in the Emergency Department. Patient's daughter verbalized understanding but requested phone number for patient experience. Phone number obtained and given to patient's daughter per request.

## 2024-10-09 NOTE — ED Notes (Signed)
 This RN saw patient and daughter walking out of department prior to PA being able to discuss AMA with them or them being able to sign forms. This RN asked if patient still ahd IV in arm. Patient states he does. This RN removed patient's IV in lobby. Patient would not stay to discuss further with this RN.

## 2024-10-09 NOTE — ED Provider Notes (Signed)
 Pt wanted to leave AMA. Left the room and did not wait for paperwork or my evaluation. I was told by staff that he was aware that his troponin was elevated and we were concerned that there could be a cardiac event that was going on such as a heart attack and he wanted to leave despite this.    Yolande Lamar BROCKS, MD 10/09/24 [phone]

## 2024-10-09 NOTE — ED Provider Notes (Signed)
 [place] [facility] DE[facility]MENT AT [facility] Provider Note   CSN: [id number] Arrival date & time: 10/09/24  1137     Patient presents with: Shortness of Breath and Knee Pain   Jose [name] is a 88 y.o. male with history of MI, CAD, ventricular fibrillation, hypertension, hyperlipidemia, skin cancer, presents with concern for shortness of breath that has been ongoing for the past 3 days after receiving his COVID booster shot.  He reports a generalized feeling shortness of breath.  No associated chest pain.  No fevers, chills, cough.  No pain or swelling in his legs.  He does report he was recently hospitalized for a COVID-pneumonia and was discharged on 08/23/2024.  Daughter at bedside reports he has been reporting some left knee pain.  Patient states this is because he did not sleep with a pillow in between his legs and this caused his knee pain to flareup.  He denies any difficulties with ambulation or injuries to his left knee.    Shortness of Breath Knee Pain      Prior to Admission medications   Medication Sig Start Date End Date Taking? Authorizing Provider  amLODipine  (NORVASC ) 10 MG tablet Take 10 mg by mouth daily. 11/10/22   [provider]  aspirin  EC 81 MG tablet Take 81 mg by mouth daily.    [provider]  ezetimibe  (ZETIA ) 10 MG tablet Take 1 tablet (10 mg total) by mouth daily. 04/17/24   Croitoru, Mihai, MD  feeding supplement (ENSURE PLUS HIGH PROTEIN) LIQD Take 237 mLs by mouth 3 (three) times daily between meals. 09/02/24   Ghimire, Donalda HERO, MD  furosemide  (LASIX ) 40 MG tablet Take 0.5 tablets (20 mg total) by mouth daily as needed for edema or fluid. 09/02/24 09/02/25  Ghimire, Donalda HERO, MD  guaiFENesin -dextromethorphan  (ROBITUSSIN DM) 100-10 MG/5ML syrup Take 5 mLs by mouth every 4 (four) hours as needed for cough. 09/02/24   Ghimire, Donalda HERO, MD  ipratropium-albuterol  (DUONEB) 0.5-2.5 (3) MG/3ML SOLN Take 3 mLs by nebulization every 6  (six) hours as needed. 09/02/24   Ghimire, Donalda HERO, MD  melatonin 5 MG TABS Take 1 tablet (5 mg total) by mouth at bedtime. 09/02/24   Ghimire, Donalda HERO, MD  polyethylene glycol (MIRALAX  / GLYCOLAX ) 17 g packet Take 17 g by mouth daily as needed for mild constipation. 09/02/24   Ghimire, Donalda HERO, MD  pravastatin  (PRAVACHOL ) 10 MG tablet Take 10 mg by mouth at bedtime.  08/06/17   [provider]  predniSONE  (DELTASONE ) 10 MG tablet 30 mg p.o. daily for 2 days, 20 mg p.o. daily for 2 days, 10 mg p.o. daily for 1 day and then stop 09/03/24   Raenelle Donalda HERO, MD    Allergies: Yellow jacket venom [bee venom], Benicar [olmesartan], Cozaar [losartan], Plavix [clopidogrel], Relafen [nabumetone], Statins, Tekturna [aliskiren], Atorvastatin, and Ramipril    Review of Systems  Respiratory:  Positive for shortness of breath.     Updated Vital Signs BP 112/65 (BP Location: Right Arm)   Pulse 81   Temp 97.6 F (36.4 C) (Axillary)   Resp 16   SpO2 100%   Physical Exam Vitals and nursing note reviewed.  Constitutional:      General: He is not in acute distress.    Appearance: He is well-developed.  HENT:     Head: Normocephalic and atraumatic.  Eyes:     Conjunctiva/sclera: Conjunctivae normal.  Cardiovascular:     Rate and Rhythm: Normal rate and regular  rhythm.     Heart sounds: No murmur heard.    Comments: 1+ pedal pulses bilaterally  2+ radial pulse bilaterally Pulmonary:     Effort: Pulmonary effort is normal. No respiratory distress.     Breath sounds: Normal breath sounds.     Comments: Talking in full sentences without difficulty Lungs clear to auscultation bilaterally Abdominal:     Palpations: Abdomen is soft.     Tenderness: There is no abdominal tenderness.  Musculoskeletal:        General: No swelling.     Cervical back: Neck supple.     Comments: Lower extremities without any edema.  No calf tenderness to palpation.  Full range of motion of the upper and  lower extremities bilaterally  No tenderness to the left femur, patella, tibia, fibula.  No point tenderness along the medial or lateral joint lines.  Fully flexes and extends the left knee without difficulty.  No overlying skin change such as erythema, edema, or wounds of the left knee.  Skin:    General: Skin is warm and dry.     Capillary Refill: Capillary refill takes less than 2 seconds.  Neurological:     Mental Status: He is alert.  Psychiatric:        Mood and Affect: Mood normal.     (all labs ordered are listed, but only abnormal results are displayed) Labs Reviewed  BASIC METABOLIC PANEL WITH GFR - Abnormal; Notable for the following components:      Result Value   Glucose, Bld 147 (*)    All other components within normal limits  CBC - Abnormal; Notable for the following components:   RBC 4.10 (*)    Hemoglobin 12.5 (*)    HCT 38.2 (*)    All other components within normal limits  D-DIMER, QUANTITATIVE - Abnormal; Notable for the following components:   D-Dimer, Quant 0.60 (*)    All other components within normal limits  TROPONIN T, HIGH SENSITIVITY - Abnormal; Notable for the following components:   Troponin T High Sensitivity 38 (*)    All other components within normal limits  TROPONIN T, HIGH SENSITIVITY - Abnormal; Notable for the following components:   Troponin T High Sensitivity 37 (*)    All other components within normal limits  RESP PANEL BY RT-PCR (RSV, FLU A&B, COVID)  [id number]  OCCULT BLOOD X 1 CARD TO LAB, STOOL    EKG: EKG Interpretation Date/Time:  Friday October 09 2024 11:48:41 EST Ventricular Rate:  79 PR Interval:  170 QRS Duration:  100 QT Interval:  386 QTC Calculation: 442 R Axis:   39  Text Interpretation: Normal sinus rhythm Possible Anterior infarct , age undetermined ST & T wave abnormality, consider inferolateral ischemia No significant change since last tracing When compared with ECG of 13-Sep-2021 13:01, T wave inversion more  evident in Inferior leads Confirmed by Doretha Folks ([id number]) on 10/09/2024 2:17:57 PM  Radiology: CT Chest Wo Contrast Result Date: 10/09/2024 EXAM: CT CHEST WITHOUT CONTRAST 10/09/2024 02:40:00 PM TECHNIQUE: CT of the chest was performed without the administration of intravenous contrast. Multiplanar reformatted images are provided for review. Automated exposure control, iterative reconstruction, and/or weight based adjustment of the mA/kV was utilized to reduce the radiation dose to as low as reasonably achievable. COMPARISON: None available. CLINICAL HISTORY: shortness of breath, pulmonary nodule evaluation FINDINGS: . MEDIASTINUM: Ascending thoracic aorta measures 42 mm. Post CABG anatomy. Heart and pericardium are unremarkable. The central airways are clear. LYMPH NODES:  No mediastinal, hilar or axillary lymphadenopathy. LUNGS AND PLEURA: There is extensive peripheral interstitial reticulation and thickening within the left and right lung consistent with interstitial lung disease. No frank honeycombing. No bronchiectasis. No suspicious pulmonary nodules. No pleural fluid. No pneumothorax. SOFT TISSUES/BONES: Procedure port. No acute abnormality of the bones or soft tissues. UPPER ABDOMEN: Limited view of the abdomen demonstrates bilateral nonobstructing renal calculi. There are bilateral simple fluid attenuation and renal cysts. Per consensus, no follow-up is needed for simple Bosniak type 1 and 2 renal cysts, unless the patient has a malignancy history or risk factors. IMPRESSION: 1. Extensive peripheral interstitial reticulation consistent with interstitial lung disease without honeycombing or bronchiectasis. Consider dedicated ILD evaluation and pulmonology follow-up if not already established. 2. No suspicious pulmonary nodules. 3. Ascending thoracic aorta measures 42 mm, consistent with mild dilation; recommend cardiology/vascular follow-up per clinical discretion. 4. Post CABG anatomy.  Electronically signed by: Norleen Boxer MD 10/09/2024 03:29 PM EST RP Workstation: [id number]   DG Knee Complete 4 Views Left Result Date: 10/09/2024 EXAM: 4 OR MORE VIEW(S) XRAY OF THE LEFT KNEE 10/09/2024 12:10:00 PM COMPARISON: None available. CLINICAL HISTORY: pain pain FINDINGS: BONES AND JOINTS: Bipartite patella is noted. Moderate narrowing of medial joint space is noted. No acute fracture. No focal osseous lesion. No joint dislocation. No significant joint effusion. SOFT TISSUES: The soft tissues are unremarkable. IMPRESSION: 1. Moderate medial compartment joint space narrowing. Electronically signed by: Lynwood Seip MD 10/09/2024 01:26 PM EST RP Workstation: [id number]   DG Chest 2 View Result Date: 10/09/2024 EXAM: 2 VIEW(S) XRAY OF THE CHEST 10/09/2024 12:10:00 PM COMPARISON: None available. CLINICAL HISTORY: sob FINDINGS: LUNGS AND PLEURA: Emphysematous disease is noted. There is concern for a nodule or inflammation. CT scan is recommended for further evaluation. No pulmonary edema. No pleural effusion. No pneumothorax. HEART AND MEDIASTINUM: Status post coronary artery bypass graft scarring. BONES AND SOFT TISSUES: No acute osseous abnormality. IMPRESSION: 1. Status post coronary artery bypass grafting. 2. Emphysema. 3. Pulmonary nodule or focal inflammation is suspected. Recommend chest CT for further evaluation. Electronically signed by: Lynwood Seip MD 10/09/2024 01:23 PM EST RP Workstation: [id number]     Procedures   Medications Ordered in the ED - No data to display                                  Medical Decision Making Amount and/or Complexity of Data Reviewed Labs: ordered. Radiology: ordered.     Differential diagnosis includes but is not limited to ACS, arrhythmia, aortic aneurysm, pericarditis, myocarditis, pericardial effusion, cardiac tamponade, musculoskeletal pain, GERD, Boerhaave's syndrome, DVT/PE, pneumonia, pleural effusion   ED Course:  Upon initial  evaluation, patient is well-appearing with normal vital signs.  Lungs clear to auscultation bilaterally.  Talking in full sentences without difficulty.  Reporting some shortness of breath since receiving his COVID booster 3 days ago.   Labs Ordered: I Ordered, and personally interpreted labs.  The pertinent results include:   CBC without leukocytosis.  Hemoglobin 12.5, down from 15 taken 3 weeks ago BMP with elevated glucose 147, otherwise within normal limits D-dimer at 0.6, within normal limits for age Initial troponin elevated at 51, repeat 37 COVID, flu, RSV negative Hemoccult testing negative  Imaging Studies ordered: I ordered imaging studies including chest x-ray, x-ray left knee, CT chest I independently visualized the imaging with scope of interpretation limited to determining acute life threatening conditions related to emergency  care. Imaging showed  IMPRESSION:  1. Status post coronary artery bypass grafting.  2. Emphysema.  3. Pulmonary nodule or focal inflammation is suspected. Recommend chest CT for  further evaluation.   IMPRESSION:  1. Extensive peripheral interstitial reticulation consistent with interstitial  lung disease without honeycombing or bronchiectasis. Consider dedicated ILD  evaluation and pulmonology follow-up if not already established.  2. No suspicious pulmonary nodules.  3. Ascending thoracic aorta measures 42 mm, consistent with mild dilation;  recommend cardiology/vascular follow-up per clinical discretion.  4. Post CABG anatomy.   IMPRESSION:  1. Moderate medial compartment joint space narrowing.    I agree with the radiologist interpretation   Cardiac Monitoring: / EKG: The patient was maintained on a cardiac monitor.  I personally viewed and interpreted the cardiac monitored which showed an underlying rhythm of: Normal sinus rhythm  Medications Given: None  Unfortunately, patient eloped prior to full workup being completed and prior to  being able to discuss his results with him. Lower concern for ACS at this time given troponin remains stable with initial troponin of 38 and repeat of 37, no chest pain, and EKG with normal sinus rhythm and no ST changes.  Low concern for DVT or PE at this time given D-dimer within normal limits for age.  His Hemoccult was at 12.5 which was lower than his baseline of 15.  His Hemoccult was negative.  Unclear as to the cause of this drop in hemoglobin. His chest x-ray showed concern for a possible pulmonary nodule or focal inflammation, and so CT of the chest was obtained for further evaluation.  This did not show any pulmonary nodules.  It did show extensive interstitial lung disease which could be contributing to his shortness of breath.   Unfortunately, was unable to discuss these results with the patient given his elopement prior to full workup being back.  Patient was unwilling to wait for me to come talk to him about results.   Impression: Shortness of breath Interstitial lung disease  Disposition:  Patient left ED after speaking with RN staff but refused to accept/wait for MD/PA to discuss results and treatment options.    This chart was dictated using voice recognition software, Dragon. Despite the best efforts of this provider to proofread and correct errors, errors may still occur which can change documentation meaning.       Final diagnoses:  Shortness of breath  Interstitial lung disease [facility])    ED Discharge Orders     None          Veta Palma, PA-C 10/09/24 1636    Yolande Lamar BROCKS, MD 10/14/24 [phone]

## 2024-10-11 ENCOUNTER — Emergency Department (HOSPITAL_COMMUNITY)

## 2024-10-11 ENCOUNTER — Encounter (HOSPITAL_COMMUNITY): Payer: Self-pay

## 2024-10-11 ENCOUNTER — Emergency Department (HOSPITAL_COMMUNITY)
Admission: EM | Admit: 2024-10-11 | Discharge: 2024-10-11 | Disposition: A | Discharge status: Home / Self Care | Attending: Emergency Medicine | Admitting: Emergency Medicine

## 2024-10-11 ENCOUNTER — Other Ambulatory Visit: Payer: Self-pay

## 2024-10-11 DIAGNOSIS — R0602 Shortness of breath: Secondary | ICD-10-CM | POA: Diagnosis present

## 2024-10-11 DIAGNOSIS — I2510 Atherosclerotic heart disease of native coronary artery without angina pectoris: Secondary | ICD-10-CM | POA: Insufficient documentation

## 2024-10-11 DIAGNOSIS — R600 Localized edema: Secondary | ICD-10-CM | POA: Insufficient documentation

## 2024-10-11 DIAGNOSIS — I10 Essential (primary) hypertension: Secondary | ICD-10-CM | POA: Diagnosis not present

## 2024-10-11 DIAGNOSIS — Z7982 Long term (current) use of aspirin: Secondary | ICD-10-CM | POA: Insufficient documentation

## 2024-10-11 LAB — URINALYSIS, ROUTINE W REFLEX MICROSCOPIC
Bilirubin Urine: NEGATIVE
Glucose, UA: NEGATIVE mg/dL
Hgb urine dipstick: NEGATIVE
Ketones, ur: NEGATIVE mg/dL
Leukocytes,Ua: NEGATIVE
Nitrite: NEGATIVE
Protein, ur: NEGATIVE mg/dL
Specific Gravity, Urine: 1.009 (ref 1.005–1.030)
pH: 8 (ref 5.0–8.0)

## 2024-10-11 LAB — BASIC METABOLIC PANEL WITH GFR
Anion gap: 13 (ref 5–15)
BUN: 18 mg/dL (ref 8–23)
CO2: 26 mmol/L (ref 22–32)
Calcium: 9.3 mg/dL (ref 8.9–10.3)
Chloride: 103 mmol/L (ref 98–111)
Creatinine, Ser: 1.01 mg/dL (ref 0.61–1.24)
GFR, Estimated: >60 mL/min (ref >60)
Glucose, Bld: 161 mg/dL — ABNORMAL HIGH (ref 70–99)
Potassium: 3.7 mmol/L (ref 3.5–5.1)
Sodium: 142 mmol/L (ref 135–145)

## 2024-10-11 LAB — CBC
HCT: 36.8 % — ABNORMAL LOW (ref 39.0–52.0)
Hemoglobin: 11.9 g/dL — ABNORMAL LOW (ref 13.0–17.0)
MCH: 31 pg (ref 26.0–34.0)
MCHC: 32.3 g/dL (ref 30.0–36.0)
MCV: 95.8 fL (ref 80.0–100.0)
Platelets: 241 K/uL (ref 150–400)
RBC: 3.84 MIL/uL — ABNORMAL LOW (ref 4.22–5.81)
RDW: 13.7 % (ref 11.5–15.5)
WBC: 7.1 K/uL (ref 4.0–10.5)
nRBC: 0 % (ref 0.0–0.2)

## 2024-10-11 LAB — BRAIN NATRIURETIC PEPTIDE: B Natriuretic Peptide: 53.2 pg/mL (ref 0.0–100.0)

## 2024-10-11 LAB — TROPONIN I (HIGH SENSITIVITY)
Troponin I (High Sensitivity): 9 ng/L (ref <18)
Troponin I (High Sensitivity): 9 ng/L (ref <18)

## 2024-10-11 MED ORDER — IOHEXOL 350 MG/ML SOLN
75.0000 mL | Freq: Once | INTRAVENOUS | Status: AC | PRN
  Administered 2024-10-11: 75 mL via INTRAVENOUS

## 2024-10-11 NOTE — ED Notes (Signed)
 Patient transported to CT

## 2024-10-11 NOTE — ED Triage Notes (Signed)
 Pt c/o chest pain and shortness of breath for past two days. Pt has also had a cough with thick mucus. Pt has had shortness of breath  since having covid for past couple of months. Pt's family states pt had CT scan that showed dementia but noone has spoken with family about those results and is asking for someone to tell them what it means.

## 2024-10-11 NOTE — ED Provider Notes (Cosign Needed Addendum)
 [place] EMERGENCY DEPARTMENT AT [facility] Provider Note   CSN: [id number] Arrival date & time: 10/11/24  0459     Patient presents with: Chest Pain   Jose [name] is a 88 y.o. male.   88 year old male presenting with shortness of breath.  Patient is accompanied by his daughter who provides collateral information, patient is a poor historian.  Patient reports that he has been feeling short of breath since receiving his COVID booster, however his daughter interjects and states that he has been seemingly short of breath since having COVID and being hospitalized back in September, he was moved to a rehab facility after his hospitalization and was placed on oxygen at the rehab facility, he is not on oxygen at home currently nor was he on oxygen prior to this hospitalization.  His daughter notes that he seems to be short of breath at rest, patient states that he is only short of breath if he has to breathe through his mouth.  He denies cough but his daughter states that he coughed up some yellow blood-tinged mucus this morning when she picked him up to bring him to the emergency department.  He has never been diagnosed with COPD or chronic lung disease of any kind.  He denies chest pain but endorses mild abdominal discomfort that he associates with constipation, states that he has not had a bowel movement in a long time, his daughter states he had a bowel movement last night after taking Colace.  His daughter notes worsening lower extremity edema, stating he cannot even wear his own shoes and has to wear her brother shoes which are several sizes larger due to the swelling, she reports that his left leg typically seems more swollen than the right.  He denies nausea/vomiting, dysuria, fever.  History of CAD with CABG.   Chest Pain      Prior to Admission medications   Medication Sig Start Date End Date Taking? Authorizing Provider  amLODipine  (NORVASC ) 10 MG tablet Take 10 mg by  mouth daily. 11/10/22   [provider]  aspirin  EC 81 MG tablet Take 81 mg by mouth daily.    [provider]  ezetimibe  (ZETIA ) 10 MG tablet Take 1 tablet (10 mg total) by mouth daily. 04/17/24   Croitoru, Mihai, MD  feeding supplement (ENSURE PLUS HIGH PROTEIN) LIQD Take 237 mLs by mouth 3 (three) times daily between meals. 09/02/24   Ghimire, Donalda HERO, MD  furosemide  (LASIX ) 40 MG tablet Take 0.5 tablets (20 mg total) by mouth daily as needed for edema or fluid. 09/02/24 09/02/25  Ghimire, Donalda HERO, MD  guaiFENesin -dextromethorphan  (ROBITUSSIN DM) 100-10 MG/5ML syrup Take 5 mLs by mouth every 4 (four) hours as needed for cough. 09/02/24   Ghimire, Donalda HERO, MD  ipratropium-albuterol  (DUONEB) 0.5-2.5 (3) MG/3ML SOLN Take 3 mLs by nebulization every 6 (six) hours as needed. 09/02/24   Ghimire, Donalda HERO, MD  melatonin 5 MG TABS Take 1 tablet (5 mg total) by mouth at bedtime. 09/02/24   Ghimire, Donalda HERO, MD  polyethylene glycol (MIRALAX  / GLYCOLAX ) 17 g packet Take 17 g by mouth daily as needed for mild constipation. 09/02/24   Ghimire, Donalda HERO, MD  pravastatin  (PRAVACHOL ) 10 MG tablet Take 10 mg by mouth at bedtime.  08/06/17   [provider]  predniSONE  (DELTASONE ) 10 MG tablet 30 mg p.o. daily for 2 days, 20 mg p.o. daily for 2 days, 10 mg p.o. daily for 1 day and then  stop 09/03/24   Raenelle Donalda HERO, MD    Allergies: Yellow jacket venom [bee venom], Benicar [olmesartan], Cozaar [losartan], Plavix [clopidogrel], Relafen [nabumetone], Statins, Tekturna [aliskiren], Atorvastatin, and Ramipril    Review of Systems  Cardiovascular:  Positive for chest pain.    Updated Vital Signs  Vitals:   10/11/24 0700 10/11/24 0815 10/11/24 0830 10/11/24 1030  BP: (!) 142/94 (!) 145/74 (!) 141/68 (!) 163/74  Pulse: 69 82 (!) 59 93  Resp: 16 16 (!) 22 (!) 27  Temp:      SpO2: 98% 99% 97% 91%  Weight:      Height:         Physical Exam Vitals and nursing note reviewed.   HENT:     Head: Normocephalic.  Eyes:     Extraocular Movements: Extraocular movements intact.  Cardiovascular:     Rate and Rhythm: Normal rate and regular rhythm.  Pulmonary:     Effort: Pulmonary effort is normal.     Comments: Speaking in full/clear sentences with ease, no signs of respiratory distress Trace crackles present in the lung bases bilaterally Abdominal:     Palpations: Abdomen is soft.     Tenderness: There is no abdominal tenderness. There is no guarding.  Musculoskeletal:     Cervical back: Normal range of motion.     Right lower leg: Edema present.     Left lower leg: Edema present.     Comments: Moves all extremities spontaneously without difficulty Trace-1+ pitting edema of bilateral LE's, L minimally more edematous than the R  Skin:    General: Skin is warm and dry.  Neurological:     Mental Status: He is alert and oriented to person, place, and time.     (all labs ordered are listed, but only abnormal results are displayed) Labs Reviewed  BASIC METABOLIC PANEL WITH GFR - Abnormal; Notable for the following components:      Result Value   Glucose, Bld 161 (*)    All other components within normal limits  CBC - Abnormal; Notable for the following components:   RBC 3.84 (*)    Hemoglobin 11.9 (*)    HCT 36.8 (*)    All other components within normal limits  URINALYSIS, ROUTINE W REFLEX MICROSCOPIC - Abnormal; Notable for the following components:   APPearance HAZY (*)    All other components within normal limits  BRAIN NATRIURETIC PEPTIDE  TROPONIN I (HIGH SENSITIVITY)  TROPONIN I (HIGH SENSITIVITY)    EKG: EKG Interpretation Date/Time:  Sunday October 11 2024 05:10:21 EST Ventricular Rate:  75 PR Interval:  168 QRS Duration:  120 QT Interval:  386 QTC Calculation: 431 R Axis:   -24  Text Interpretation: Normal sinus rhythm Left ventricular hypertrophy with QRS widening and repolarization abnormality ( Cornell product ) Confirmed by  Nettie, April ([id number]) on 10/11/2024 5:58:53 AM  Radiology: [facility] Chest 2 View Result Date: 10/11/2024 EXAM: 2 VIEW(S) XRAY OF THE CHEST 10/11/2024 05:51:09 AM COMPARISON: Chest CT 10/09/2024. CLINICAL HISTORY: chest pain FINDINGS: LUNGS AND PLEURA: Diffuse interstitial opacities, greatest within mid to lower lung zones. Advanced chronic pulmonary interstitial changes, demonstrated recently on CT. Lung markings appear stable since September. No pulmonary edema. No pleural effusion. No pneumothorax. Chronic lung disease. HEART AND MEDIASTINUM: Surgical changes in mediastinum. No superimposed acute cardiopulmonary abnormality identified. BONES AND SOFT TISSUES: Sternotomy wires noted. IMPRESSION: 1. Chronic lung disease, No superimposed acute cardiopulmonary abnormality identified. Electronically signed by: Helayne Hurst MD 10/11/2024 06:14 AM EST  RP Workstation: [id number]   CT Chest Wo Contrast Result Date: 10/09/2024 EXAM: CT CHEST WITHOUT CONTRAST 10/09/2024 02:40:00 PM TECHNIQUE: CT of the chest was performed without the administration of intravenous contrast. Multiplanar reformatted images are provided for review. Automated exposure control, iterative reconstruction, and/or weight based adjustment of the mA/kV was utilized to reduce the radiation dose to as low as reasonably achievable. COMPARISON: None available. CLINICAL HISTORY: shortness of breath, pulmonary nodule evaluation FINDINGS: . MEDIASTINUM: Ascending thoracic aorta measures 42 mm. Post CABG anatomy. Heart and pericardium are unremarkable. The central airways are clear. LYMPH NODES: No mediastinal, hilar or axillary lymphadenopathy. LUNGS AND PLEURA: There is extensive peripheral interstitial reticulation and thickening within the left and right lung consistent with interstitial lung disease. No frank honeycombing. No bronchiectasis. No suspicious pulmonary nodules. No pleural fluid. No pneumothorax. SOFT TISSUES/BONES: Procedure port. No acute  abnormality of the bones or soft tissues. UPPER ABDOMEN: Limited view of the abdomen demonstrates bilateral nonobstructing renal calculi. There are bilateral simple fluid attenuation and renal cysts. Per consensus, no follow-up is needed for simple Bosniak type 1 and 2 renal cysts, unless the patient has a malignancy history or risk factors. IMPRESSION: 1. Extensive peripheral interstitial reticulation consistent with interstitial lung disease without honeycombing or bronchiectasis. Consider dedicated ILD evaluation and pulmonology follow-up if not already established. 2. No suspicious pulmonary nodules. 3. Ascending thoracic aorta measures 42 mm, consistent with mild dilation; recommend cardiology/vascular follow-up per clinical discretion. 4. Post CABG anatomy. Electronically signed by: Norleen Boxer MD 10/09/2024 03:29 PM EST RP Workstation: [id number]   DG Knee Complete 4 Views Left Result Date: 10/09/2024 EXAM: 4 OR MORE VIEW(S) XRAY OF THE LEFT KNEE 10/09/2024 12:10:00 PM COMPARISON: None available. CLINICAL HISTORY: pain pain FINDINGS: BONES AND JOINTS: Bipartite patella is noted. Moderate narrowing of medial joint space is noted. No acute fracture. No focal osseous lesion. No joint dislocation. No significant joint effusion. SOFT TISSUES: The soft tissues are unremarkable. IMPRESSION: 1. Moderate medial compartment joint space narrowing. Electronically signed by: Lynwood Seip MD 10/09/2024 01:26 PM EST RP Workstation: [id number]   DG Chest 2 View Result Date: 10/09/2024 EXAM: 2 VIEW(S) XRAY OF THE CHEST 10/09/2024 12:10:00 PM COMPARISON: None available. CLINICAL HISTORY: sob FINDINGS: LUNGS AND PLEURA: Emphysematous disease is noted. There is concern for a nodule or inflammation. CT scan is recommended for further evaluation. No pulmonary edema. No pleural effusion. No pneumothorax. HEART AND MEDIASTINUM: Status post coronary artery bypass graft scarring. BONES AND SOFT TISSUES: No acute osseous  abnormality. IMPRESSION: 1. Status post coronary artery bypass grafting. 2. Emphysema. 3. Pulmonary nodule or focal inflammation is suspected. Recommend chest CT for further evaluation. Electronically signed by: Lynwood Seip MD 10/09/2024 01:23 PM EST RP Workstation: [id number]     Procedures   Medications Ordered in the ED - No data to display                                  Medical Decision Making This patient presents to the ED for concern of shortness of breath, this involves an extensive number of treatment options, and is a complaint that carries with it a high risk of complications and morbidity.  The differential diagnosis includes pneumonia, ILD/chronic lung disease, PE, CHF exacerbation   Co morbidities that complicate the patient evaluation  CAD, HTN   Additional history obtained:  Additional history obtained from record review External records from outside source obtained and reviewed  including ED note from yesterday   Lab Tests:  I Ordered, and personally interpreted labs.  The pertinent results include:  CBC remains largely stable as compared to yesterday, hemoglobin of 11.9.  BMP unremarkable. Initial troponin of 9, repeat remains unchanged. BNP of 53.2, this is improved from 123.4 from 1 month ago.  Urinalysis is without leukocytes/nitrites, no obvious infection.   Imaging Studies ordered:  I ordered imaging studies including CXR, CTA PE study  I independently visualized and interpreted imaging which showed  - CXR: Chronic lung disease, No superimposed acute cardiopulmonary abnormality identified. - CTA PE study: 1. Negative for significant acute pulmonary embolus by CTA. 2. Similar pattern of patchy peripheral reticulonodular opacities throughout all lobes of both lungs, worse in the right lung and lower lobes bilaterally, favored to be chronic interstitial lung disease. Overall slight increased superimposed areas of bilateral peripheral patchy ground-glass  opacities, nonspecific but suspect mild superimposed alveolitis or patchy edema. 3. No other acute intrathoracic finding. 4. Aortic atherosclerosis.Aortic Atherosclerosis (ICD10-I70.0).  I agree with the radiologist interpretation   Cardiac Monitoring: / EKG:  The patient was maintained on a cardiac monitor.  I personally viewed and interpreted the cardiac monitored which showed an underlying rhythm of: NSR   Problem List / ED Course / Critical interventions / Medication management  I have reviewed the patients home medicines and have made adjustments as needed   Social Determinants of Health:  Social isolation   Test / Admission - Considered:  Vitals are unremarkable, patient is mildly hypertensive but is afebrile and with a normal heart rate, during my assessment patient maintains his oxygen saturation on room air between 98-100% the entire time.  Patient was able to ambulate with the assistance of nursing staff and did not demonstrate any significant drop in his oxygen saturation during that time, maintaining his oxygen saturation 100%. EKG remains largely unchanged from Friday. Troponin has trended down from Friday, will troponin in the mid 30's on that date down to 9 today .  Patient does not complain of chest pain currently but does complain of an ache in his abdomen at times which he associates with constipation, on my exam his abdomen is soft and nontender to palpation, I do not feel that imaging of his abdomen is necessary given reassuring physical exam findings.  Low suspicion for ACS given these reassuring findings.  CXR with evidence of chronic lung disease but no acute abnormalities, this correlates with findings on recent chest CT resemble findings associated with interstitial lung disease, patient has never been diagnosed with any lung conditions. CTA PE study is reassuring, patient does not have findings consistent with a pulmonary embolism but does have findings consistent  with interstitial lung disease as stated above.  I communicated these results in depth with the patient and his daughter, I also reviewed the results of his CT head that was completed in September at the request of his daughter.  Patient's daughter is concerned that he is exhibiting signs of dementia/Alzheimer's, I recommend that she discuss this further with his primary care provider who can screen him appropriately.  Patient does demonstrate forgetfulness at times and is a poor historian, but does not seem acutely encephalopathic, he is alert and demonstrating normal mood and behavior, he is able to answer questions but at times his daughter interjects that he is answering questions incorrectly.  His daughter expresses displeasure with his care at [facility] on Friday, stating that she does not feel that anything was done to  help him, I reiterated with her that his cardiac enzymes are reassuring today, his EKG is without ischemic changes, his CT scan does show scarring as noted above which is likely contributing to his shortness of breath, however his oxygen saturation remains in normal limits at rest and with ambulation.  I will provide him with the contact information for a pulmonologist in order to schedule follow-up in regard to his continued shortness of breath that is likely stemming from interstitial lung disease, which he has never been formally diagnosed with per patient. The patient and his daughter voiced understanding and are in agreement with this plan.  Return precautions discussed, he is appropriate for discharge at this time.    Amount and/or Complexity of Data Reviewed Labs: ordered. Radiology: ordered.  Risk Prescription drug management.        Final diagnoses:  Shortness of breath    ED Discharge Orders     None             Glendia Rocky SAILOR, [place] 10/11/24 1055    Doretha Folks, MD 10/14/24 1645

## 2024-10-11 NOTE — ED Provider Triage Note (Signed)
 Emergency Medicine Provider Triage Evaluation Note  Jose [name] , a 88 y.o. male  was evaluated in triage.  Pt complains of chest pain   Review of Systems  Positive: Chest pain Negative: Vomiting   Physical Exam  BP (!) 150/79 (BP Location: Right Arm)   Pulse 82   Temp 97.8 F (36.6 C)   Resp 16   Ht 5' 11 (1.803 m)   Wt 81 kg   SpO2 100%   BMI 24.91 kg/m  Gen:   Awake, no distress   Resp:  Normal effort  MSK:   Moves extremities without difficulty  Other:    Medical Decision Making  Medically screening exam initiated at 6:07 AM.  Appropriate orders placed.  Jose [name] was informed that the remainder of the evaluation will be completed by another provider, this initial triage assessment does not replace that evaluation, and the importance of remaining in the ED until their evaluation is complete.     Jewels Langone, MD 10/11/24 [phone]

## 2024-10-11 NOTE — Discharge Instructions (Addendum)
 Your CT scan shows some scarring of the lungs, this is consistent with a diagnosis of chronic interstitial lung disease.  This scarring in your lungs may be contributing to your shortness of breath.  I have provided you with the contact information for a pulmonologist, or lung specialist, please contact their office to schedule follow-up in regard to your CT findings and shortness of breath.  Follow-up with your primary care provider this week to discuss your symptoms.  Return to the emergency department if your symptoms worsen.

## 2024-10-11 NOTE — ED Notes (Signed)
 Pt ambulated in hall SpO2 stayed at 100%

## 2024-10-21 ENCOUNTER — Ambulatory Visit (INDEPENDENT_AMBULATORY_CARE_PROVIDER_SITE_OTHER)

## 2024-10-21 VITALS — BP 148/70 | HR 78 | Temp 97.9°F | Ht 71.0 in | Wt 169.6 lb

## 2024-10-21 DIAGNOSIS — J1282 Pneumonia due to coronavirus disease 2019: Secondary | ICD-10-CM | POA: Diagnosis not present

## 2024-10-21 DIAGNOSIS — J849 Interstitial pulmonary disease, unspecified: Secondary | ICD-10-CM

## 2024-10-21 DIAGNOSIS — R600 Localized edema: Secondary | ICD-10-CM | POA: Diagnosis not present

## 2024-10-21 DIAGNOSIS — R413 Other amnesia: Secondary | ICD-10-CM

## 2024-10-21 DIAGNOSIS — U071 COVID-19: Secondary | ICD-10-CM | POA: Diagnosis not present

## 2024-10-21 LAB — SEDIMENTATION RATE: Sed Rate: 22 mm/h — ABNORMAL HIGH (ref 0–20)

## 2024-10-21 LAB — BRAIN NATRIURETIC PEPTIDE: Pro B Natriuretic peptide (BNP): 86 pg/mL (ref 0.0–100.0)

## 2024-10-21 LAB — C-REACTIVE PROTEIN: CRP: <0.5 mg/dL (ref 0.5–20.0)

## 2024-10-21 NOTE — Progress Notes (Signed)
 New Patient Pulmonology Office Visit   Subjective:  Patient ID: Jose [name], male    DOB: 1936-09-12  MRN: [id number]  Referred by: Royden [name], FNP  CC:  Chief Complaint  Patient presents with   Consult    Consult from ed visit sob   HPI EMET RAFANAN is a 88 y.o. male with with history of CAD s/p CABG, chronic combined CHF, HLD, HTN-who is referred here for follow up.  Discussed the use of AI scribe software for clinical note transcription with the patient, who gave verbal consent to proceed.  History of Present Illness Jose [name] is an 88 year old male who presents with breathing difficulties following COVID-19 infection. He was referred for evaluation of lung scarring post-COVID-19 infection.  He has been experiencing breathing difficulties since contracting COVID-19 on August 23, 2024. Initially, he could breathe better through his nose, but has had persistent issues when breathing through his mouth. He was diagnosed with COVID pneumonia on August 25, 2024, after a clear chest x-ray on August 23, 2024. Chest Xray Sept 25 show infiltrates. He had required hospital admission for pneumonia and hypoxia at that time. He has not required oxygen since discharge. Subsequent CT scans in November has shown lung scarring, prompting the referral for further evaluation. The most recent ct chest showing persistent infiltrates was from ED visit in November, daughter reports taking  in addition to shortness of breath he was complaining of some chest pain at that time, so wanted to make sure he didn't have heart attack.  His daughter reports that he has experienced worsening memory and weakness since the COVID-19 infection. His breathing problems and other symptoms have persisted since then. No history of smoking and no known occupational exposures that could contribute to lung issues. He worked as an advertising account planner and served in [employer], where he was exposed to [employer]  without protective masks. Non smoker  He recalls being given Epsom salts during his COVID-19 treatment, which he believes helped his recovery.   He has a history of heart surgeries and has been concerned about the possibility of having had multiple heart attacks, although recent evaluations have ruled out cardiac causes for his symptoms.   He is confused and frustrated regarding a previous doctor's administration of a vaccine, which he believes was a COVID booster shot, although his daughter clarifies it was a pneumonia and flu shot. He has been upset about this perceived error.   He denies dyspnea at baseline. Resting saturation is 98% today Denies fever, chills or productive cough    ROS Review of symptoms negative except mentioned above   Allergies: Yellow jacket venom [bee venom], Benicar [olmesartan], Cozaar [losartan], Plavix [clopidogrel], Relafen [nabumetone], Statins, Tekturna [aliskiren], Atorvastatin, and Ramipril  Current Outpatient Medications:    amLODipine  (NORVASC ) 10 MG tablet, Take 10 mg by mouth daily., Disp: , Rfl:    aspirin  EC 81 MG tablet, Take 81 mg by mouth daily., Disp: , Rfl:    ezetimibe  (ZETIA ) 10 MG tablet, Take 1 tablet (10 mg total) by mouth daily., Disp: 90 tablet, Rfl: 3   feeding supplement (ENSURE PLUS HIGH PROTEIN) LIQD, Take 237 mLs by mouth 3 (three) times daily between meals., Disp: , Rfl:    furosemide  (LASIX ) 40 MG tablet, Take 0.5 tablets (20 mg total) by mouth daily as needed for edema or fluid., Disp: , Rfl:    guaiFENesin -dextromethorphan  (ROBITUSSIN DM) 100-10 MG/5ML syrup, Take 5 mLs by mouth every  4 (four) hours as needed for cough., Disp: , Rfl:    ipratropium-albuterol  (DUONEB) 0.5-2.5 (3) MG/3ML SOLN, Take 3 mLs by nebulization every 6 (six) hours as needed., Disp: , Rfl:    melatonin 5 MG TABS, Take 1 tablet (5 mg total) by mouth at bedtime., Disp: , Rfl:    polyethylene glycol (MIRALAX  / GLYCOLAX ) 17 g packet, Take 17 g by mouth daily  as needed for mild constipation., Disp: , Rfl:    pravastatin  (PRAVACHOL ) 10 MG tablet, Take 10 mg by mouth at bedtime. , Disp: , Rfl: 4 Past Medical History:  Diagnosis Date   Complication of anesthesia    hard time waking up from it alot of times (10/09/2013)   Coronary artery disease    hx CABG 2011   GERD (gastroesophageal reflux disease)    not in >30 yr (10/09/2013)   Gout    H/O cardiovascular stress test 2011     mild to mod ischemia this led to cath and CABG   Hx of echocardiogram 2010   EF >55%, mild to mod MR, mild aortic reg.    Hyperlipidemia LDL goal <70    Hypertension    Kidney stone    Myocardial infarction [facility]) 1999; 2006   both acute inferior wall   Skin cancer    right nose, lower lip, right ankle, left side (10/09/2013)   Ventricular fibrillation (HCC) 2006   arrest with MI   Past Surgical History:  Procedure Laterality Date   BACK SURGERY     CORONARY ANGIOPLASTY WITH STENT PLACEMENT  1999   Stent to RCA   CORONARY ANGIOPLASTY WITH STENT PLACEMENT  2006   stent to distal RCA   CORONARY ANGIOPLASTY WITH STENT PLACEMENT  2006   mid LAD with Cypher stent   CORONARY ARTERY BYPASS GRAFT  2011   LIMA-LAD; seq VG-1st & 2nd OM of LCX; VG-PDA of RCA   CYSTOSCOPY     had alot of laser for kidney stones (10/09/2013)   CYSTOSCOPY W/ STONE MANIPULATION  12/2003   I & D EXTREMITY  06/18/2012   Procedure: IRRIGATION AND DEBRIDEMENT EXTREMITY;  Surgeon: Prentice LELON Pagan, MD;  Location: [facility] OR;  Service: Orthopedics;  Laterality: Left;  I & D left thumb    LITHOTRIPSY     several times (10/09/2013)   LUMBAR DISC SURGERY  1998   disc ruptured (10/09/2013)   SKIN CANCER EXCISION     right nose, lower lip; right ankle, left side (10/09/2013)   Family History  Problem Relation Age of Onset   Diabetes Mother    Heart attack Father    Colon cancer Sister    Cancer Brother    Social History   Socioeconomic History   Marital status: Married    Spouse name:  Not on file   Number of children: Not on file   Years of education: Not on file   Highest education level: Not on file  Occupational History   Not on file  Tobacco Use   Smoking status: Never    Passive exposure: Past   Smokeless tobacco: Never  Vaping Use   Vaping status: Never Used  Substance and Sexual Activity   Alcohol use: Yes    Comment: 10/09/2013 I drink a beer occasionally; not regular   Drug use: No   Sexual activity: Yes  Other Topics Concern   Not on file  Social History Narrative   Not on file   Social Drivers of Health  Financial Resource Strain: Not on file  Food Insecurity: No Food Insecurity (08/24/2024)   Hunger Vital Sign    Worried About Running Out of Food in the Last Year: Never true    Ran Out of Food in the Last Year: Never true  Transportation Needs: No Transportation Needs (08/24/2024)   PRAPARE - Administrator, Civil Service (Medical): No    Lack of Transportation (Non-Medical): No  Physical Activity: Not on file  Stress: Not on file  Social Connections: Moderately Isolated (08/24/2024)   Social Connection and Isolation Panel    Frequency of Communication with Friends and Family: More than three times a week    Frequency of Social Gatherings with Friends and Family: Once a week    Attends Religious Services: Never    Database Administrator or Organizations: No    Attends Banker Meetings: Never    Marital Status: Married  Catering Manager Violence: Not At Risk (08/24/2024)   Humiliation, Afraid, Rape, and Kick questionnaire    Fear of Current or Ex-Partner: No    Emotionally Abused: No    Physically Abused: No    Sexually Abused: No         Objective:  BP (!) 148/70   Pulse 78   Temp 97.9 F (36.6 C) (Oral)   Ht 5' 11 (1.803 m)   Wt 169 lb 9.6 oz (76.9 kg)   SpO2 98%   BMI 23.65 kg/m    Physical Exam  Diagnostic Review:    Pft     No data to display               Results CT chest  10/2024 1. Extensive peripheral interstitial reticulation consistent with interstitial lung disease without honeycombing or bronchiectasis. Consider dedicated ILD evaluation and pulmonology follow-up if not already established. 2. No suspicious pulmonary nodules. 3. Ascending thoracic aorta measures 42 mm, consistent with mild dilation; recommend cardiology/vascular follow-up per clinical discretion. 4. Post CABG anatomy.    Oxygen saturation: 98% (10/21/2024)  RADIOLOGY Chest X-ray: Clear (08/23/2024) Chest X-ray: Increased haziness, indicative of pneumonia (08/27/2024) Chest CT: ground glass opacities and and scarring concerning for ILD  (10/2024)    ECHO 08/2024 1. Left ventricular ejection fraction, by estimation, is 45 to 50%. The  left ventricle has mildly decreased function. The left ventricle  demonstrates global hypokinesis. Left ventricular diastolic parameters are  consistent with Grade I diastolic  dysfunction (impaired relaxation).   2. Right ventricular systolic function is normal. The right ventricular  size is normal.   3. Left atrial size was moderately dilated.   4. The mitral valve is normal in structure. No evidence of mitral valve  regurgitation. No evidence of mitral stenosis.   5. The aortic valve is tricuspid. Aortic valve regurgitation is mild. No  aortic stenosis is present.   6. Aortic dilatation noted. There is mild dilatation of the aortic root,  measuring 40 mm. There is borderline dilatation of the ascending aorta,  measuring 39 mm.   7. The inferior vena cava is normal in size with greater than 50%  respiratory variability, suggesting right atrial pressure of 3 mmHg.     Assessment & Plan:   Assessment & Plan ILD (interstitial lung disease) (HCC) Discussed the symptoms, etiology, pathophysiology, diagnostic test, treatment, flare ups,  prognosis of ILD Broad differentials- post covid ILD, ILD, CTD -ILD. Pt doesn't have significant  exposures Also had a normal xray sept 21 and had no breathing  issues prior to that This suggest this may be post covid ILD However will rule out CTD ILD and will monitor ILD clinically Will trend lung volumes on PFT Will plan to repeat CT chest in 3 months Pt will call me his shortness of breath worsens or if he develops hypoxia Orders:   Pulmonary function test; Future   CT CHEST HIGH RESOLUTION; Future   Anti-DNA antibody, double-stranded; Future   Anti-scleroderma antibody; Future   Anti-Smith antibody; Future   B Nat Peptide; Future   C-reactive protein; Future   Rheumatoid factor; Future   Sedimentation rate; Future   Sjogrens syndrome-A extractable nuclear antibody; Future   Sjogrens syndrome-B extractable nuclear antibody; Future   ANA; Future   ANCA screen with reflex titer; Future  Pneumonia due to COVID-19 virus Less than 2 months since covid infection Recovered mostly Likely has radiologic lag However will rule out underlying primary ILD Orders:   Pulmonary function test; Future   CT CHEST HIGH RESOLUTION; Future   Anti-DNA antibody, double-stranded; Future   Anti-scleroderma antibody; Future   Anti-Smith antibody; Future   B Nat Peptide; Future   C-reactive protein; Future   Rheumatoid factor; Future   Sedimentation rate; Future   Sjogrens syndrome-A extractable nuclear antibody; Future   Sjogrens syndrome-B extractable nuclear antibody; Future   ANA; Future   ANCA screen with reflex titer; Future  Pedal edema Check BNP On amlodipine     Memory loss Pt wants to see a new PCP Daughter concerned for progressive memory loss Will refer to Geriatric medicine Orders:   Ambulatory referral to Internal Medicine    Thank you for the opportunity to take part in the care of YOSEPH HAILE   Return in about 3 months (around 01/07/2025).   Harlie Buening Pleas, MD [facility] Office: [phone]  I spent 60  minutes in the care of BRIDGER PIZZI today including reviewing labs (08/2024, 10/2024), reviewing studies (CT chest, Cxrays), face to face time discussing treatment options (as mentioned above), reviewing records from ED, hospital (sep, 2025 hospotal, ed 10/2024 visit), and documenting in the encounter.

## 2024-10-21 NOTE — Patient Instructions (Signed)
 It was a pleasure to see you today. Please have your labs drawn in our clinic today. Your pulmonary function test will be scheduled closer to your next follow up visit. Your will receive a call for your CT chest scheduling in about 3 months.

## 2024-10-26 ENCOUNTER — Ambulatory Visit: Payer: Self-pay

## 2024-10-26 NOTE — Progress Notes (Signed)
ATC x1.  LMTCB. 

## 2024-10-27 LAB — ANTI-SCLERODERMA ANTIBODY: Scleroderma (Scl-70) (ENA) Antibody, IgG: <1 AI

## 2024-10-27 LAB — ANTI-NUCLEAR AB-TITER (ANA TITER): ANA Titer 1: 1:160 {titer} — ABNORMAL HIGH

## 2024-10-27 LAB — SJOGRENS SYNDROME-B EXTRACTABLE NUCLEAR ANTIBODY: SSB (La) (ENA) Antibody, IgG: <1 AI

## 2024-10-27 LAB — SJOGRENS SYNDROME-A EXTRACTABLE NUCLEAR ANTIBODY: SSA (Ro) (ENA) Antibody, IgG: <1 AI

## 2024-10-27 LAB — ANA: Anti Nuclear Antibody (ANA): POSITIVE — AB

## 2024-10-27 LAB — ANTI-DNA ANTIBODY, DOUBLE-STRANDED: ds DNA Ab: <1 [IU]/mL

## 2024-10-27 LAB — ANTI-SMITH ANTIBODY: ENA SM Ab Ser-aCnc: <1 AI

## 2024-10-27 LAB — RHEUMATOID FACTOR: Rheumatoid fact SerPl-aCnc: <10 [IU]/mL (ref <14)

## 2024-10-27 LAB — ANCA SCREEN W REFLEX TITER: ANCA SCREEN: NEGATIVE

## 2024-10-28 NOTE — Progress Notes (Signed)
 ATCx2 LVMTCB. Sending MyChart message per protocol as pt has been unable to be reached.

## 2024-11-05 ENCOUNTER — Encounter: Admitting: Adult Health

## 2024-11-05 DIAGNOSIS — E782 Mixed hyperlipidemia: Secondary | ICD-10-CM

## 2024-11-05 DIAGNOSIS — J849 Interstitial pulmonary disease, unspecified: Secondary | ICD-10-CM

## 2024-11-05 DIAGNOSIS — R627 Adult failure to thrive: Secondary | ICD-10-CM

## 2024-11-05 DIAGNOSIS — I10 Essential (primary) hypertension: Secondary | ICD-10-CM

## 2024-11-05 NOTE — Progress Notes (Signed)
 This encounter was created in error - please disregard.

## 2024-11-20 ENCOUNTER — Telehealth: Payer: Self-pay | Admitting: Cardiovascular Disease

## 2024-11-20 NOTE — Telephone Encounter (Signed)
 Left message for patient's daughter to call back.

## 2024-11-20 NOTE — Telephone Encounter (Signed)
 Call to patient/daughter. No answer.  No updated DPR on file for our practice. Left VM asking recipient to call Merrill at our office #. Advised I was triage nurse calling back.

## 2024-11-20 NOTE — Telephone Encounter (Signed)
 Pt c/o swelling/edema: STAT if pt has developed SOB within 24 hours  If swelling, where is the swelling located? legs  How much weight have you gained and in what time span? N/a  Have you gained 2 pounds in a day or 5 pounds in a week? N/a  Do you have a log of your daily weights (if so, list)? no  Are you currently taking a fluid pill? no  Are you currently SOB? Yes   Have you traveled recently in a car or plane for an extended period of time? no  Pt daughter called for appt. PCP recommended.

## 2024-11-20 NOTE — Telephone Encounter (Signed)
 Pt returning call. Please advise.

## 2024-11-24 NOTE — Telephone Encounter (Signed)
 Left message with call back number.

## 2024-11-30 NOTE — Telephone Encounter (Signed)
 Left message on daughter's voicemail and spouse's voicemail- asking if the pt would be able to come for an appt on 12/09/24. Left call back number.

## 2024-12-14 ENCOUNTER — Other Ambulatory Visit (HOSPITAL_COMMUNITY): Payer: Self-pay | Admitting: Internal Medicine

## 2024-12-14 DIAGNOSIS — F03B2 Unspecified dementia, moderate, with psychotic disturbance: Secondary | ICD-10-CM

## 2024-12-18 ENCOUNTER — Emergency Department (HOSPITAL_COMMUNITY)
Admission: EM | Admit: 2024-12-18 | Discharge: 2024-12-18 | Disposition: A | Discharge status: Home / Self Care | Attending: Emergency Medicine | Admitting: Emergency Medicine

## 2024-12-18 ENCOUNTER — Other Ambulatory Visit: Payer: Self-pay

## 2024-12-18 ENCOUNTER — Encounter (HOSPITAL_COMMUNITY): Payer: Self-pay

## 2024-12-18 ENCOUNTER — Emergency Department (HOSPITAL_COMMUNITY)

## 2024-12-18 DIAGNOSIS — Y92009 Unspecified place in unspecified non-institutional (private) residence as the place of occurrence of the external cause: Secondary | ICD-10-CM | POA: Diagnosis not present

## 2024-12-18 DIAGNOSIS — I10 Essential (primary) hypertension: Secondary | ICD-10-CM | POA: Insufficient documentation

## 2024-12-18 DIAGNOSIS — G3183 Dementia with Lewy bodies: Secondary | ICD-10-CM | POA: Insufficient documentation

## 2024-12-18 DIAGNOSIS — W19XXXA Unspecified fall, initial encounter: Secondary | ICD-10-CM | POA: Insufficient documentation

## 2024-12-18 DIAGNOSIS — M25552 Pain in left hip: Secondary | ICD-10-CM | POA: Diagnosis present

## 2024-12-18 DIAGNOSIS — Z79899 Other long term (current) drug therapy: Secondary | ICD-10-CM | POA: Diagnosis not present

## 2024-12-18 DIAGNOSIS — Z7982 Long term (current) use of aspirin: Secondary | ICD-10-CM | POA: Insufficient documentation

## 2024-12-18 DIAGNOSIS — F0280 Dementia in other diseases classified elsewhere without behavioral disturbance: Secondary | ICD-10-CM | POA: Diagnosis not present

## 2024-12-18 DIAGNOSIS — I2510 Atherosclerotic heart disease of native coronary artery without angina pectoris: Secondary | ICD-10-CM | POA: Insufficient documentation

## 2024-12-18 NOTE — ED Triage Notes (Signed)
 Pt BIB [facility] from home with c/o multiple falls. Lives with family as caregivers. Fell twice today, cannot bear weight bilaterally. Dementia at baseline, difficult to convey pain but grimaced with palpation of hips, shoulder, neck and back, primarily R side. Family says he may have hit head during a fall, swelling to L upper forehead unknown if it was from today or not. No thinners. No UTI or URI symptoms. Elopement risk per family.   C collar in place by EMS. VS 168/92, HR 70, 20 RR, 95% RA, 97.51F

## 2024-12-18 NOTE — ED Provider Notes (Signed)
 " [facility] Provider Note   CSN: [id number] Arrival date & time: 12/18/24  1743     Patient presents with: Jose   DEWANE [name] is a 89 y.o. male with past medical history notable for Lewy body dementia, CAD with prior MI, hypertension, hyperlipidemia who presents today for evaluation of recurrent falls.  I spoke with both patient as well as daughter.  Patient reports that he has had falls throughout the week all mechanical nature.  Most recently he fell earlier today stating that the floor at home was slick and he lost his balance.  Denies any chest pain, shortness of breath, presyncope or syncope.  No significant head trauma or LOC.  Patient with mild hip pain at this point in time otherwise without any acute symptoms.   Fall       Prior to Admission medications  Medication Sig Start Date End Date Taking? Authorizing Provider  amLODipine  (NORVASC ) 10 MG tablet Take 10 mg by mouth daily. 11/10/22   [provider]  aspirin  EC 81 MG tablet Take 81 mg by mouth daily.    [provider]  ezetimibe  (ZETIA ) 10 MG tablet Take 1 tablet (10 mg total) by mouth daily. 04/17/24   Croitoru, Mihai, MD  feeding supplement (ENSURE PLUS HIGH PROTEIN) LIQD Take 237 mLs by mouth 3 (three) times daily between meals. 09/02/24   Ghimire, Donalda HERO, MD  furosemide  (LASIX ) 40 MG tablet Take 0.5 tablets (20 mg total) by mouth daily as needed for edema or fluid. 09/02/24 09/02/25  Ghimire, Donalda HERO, MD  guaiFENesin -dextromethorphan  (ROBITUSSIN DM) 100-10 MG/5ML syrup Take 5 mLs by mouth every 4 (four) hours as needed for cough. 09/02/24   Ghimire, Donalda HERO, MD  ipratropium-albuterol  (DUONEB) 0.5-2.5 (3) MG/3ML SOLN Take 3 mLs by nebulization every 6 (six) hours as needed. 09/02/24   Ghimire, Donalda HERO, MD  melatonin 5 MG TABS Take 1 tablet (5 mg total) by mouth at bedtime. 09/02/24   Ghimire, Donalda HERO, MD  polyethylene glycol (MIRALAX  / GLYCOLAX ) 17 g  packet Take 17 g by mouth daily as needed for mild constipation. 09/02/24   Ghimire, Donalda HERO, MD  pravastatin  (PRAVACHOL ) 10 MG tablet Take 10 mg by mouth at bedtime.  08/06/17   [provider]    Allergies: Yellow jacket venom [bee venom], Benicar [olmesartan], Cozaar [losartan], Plavix [clopidogrel], Relafen [nabumetone], Statins, Tekturna [aliskiren], Atorvastatin, and Ramipril    Review of Systems  Updated Vital Signs BP (!) 165/97 (BP Location: Left Arm)   Pulse 77   Temp 97.7 F (36.5 C) (Oral)   Resp 16   SpO2 99%   Physical Exam Constitutional:      General: He is not in acute distress.    Appearance: Normal appearance.  HENT:     Head: Normocephalic.     Right Ear: External ear normal.     Left Ear: External ear normal.     Nose: Nose normal.     Mouth/Throat:     Mouth: Mucous membranes are moist.  Eyes:     Pupils: Pupils are equal, round, and reactive to light.  Cardiovascular:     Rate and Rhythm: Normal rate and regular rhythm.     Pulses: Normal pulses.  Pulmonary:     Effort: Pulmonary effort is normal.  Abdominal:     General: Abdomen is flat.  Musculoskeletal:        General: Normal range of motion.  Cervical back: Normal range of motion. No tenderness.  Skin:    General: Skin is warm.     Capillary Refill: Capillary refill takes less than 2 seconds.  Neurological:     General: No focal deficit present.     Mental Status: He is alert and oriented to person, place, and time.  Psychiatric:        Mood and Affect: Mood normal.     (all labs ordered are listed, but only abnormal results are displayed) Labs Reviewed - No data to display  EKG: None  Radiology: CT Cervical Spine Wo Contrast Result Date: 12/18/2024 EXAM: CT CERVICAL SPINE WITHOUT CONTRAST 12/18/2024 08:23:23 PM TECHNIQUE: CT of the cervical spine was performed without the administration of intravenous contrast. Multiplanar reformatted images are provided for review.  Automated exposure control, iterative reconstruction, and/or weight based adjustment of the mA/kV was utilized to reduce the radiation dose to as low as reasonably achievable. COMPARISON: None available. CLINICAL HISTORY: Neck trauma (Age >= 65y) Neck trauma (Age >= 65y) FINDINGS: BONES AND ALIGNMENT: No acute fracture or traumatic malalignment. DEGENERATIVE CHANGES: No significant degenerative changes. SOFT TISSUES: No prevertebral soft tissue swelling. IMPRESSION: 1. No significant abnormality Electronically signed by: Franky Stanford MD 12/18/2024 08:34 PM EST RP Workstation: [id number]   CT Head Wo Contrast Result Date: 12/18/2024 EXAM: CT HEAD WITHOUT CONTRAST 12/18/2024 08:23:23 PM TECHNIQUE: CT of the head was performed without the administration of intravenous contrast. Automated exposure control, iterative reconstruction, and/or weight based adjustment of the mA/kV was utilized to reduce the radiation dose to as low as reasonably achievable. COMPARISON: 08/23/2024 CLINICAL HISTORY: Head trauma, minor (Age >= 65y). FINDINGS: BRAIN AND VENTRICLES: No acute hemorrhage. No evidence of acute infarct. Periventricular white matter decreased attenuation consistent with small vessel ischemic changes. Encephalomalacia consistent with chronic bilateral cerebellar, left occipital and left frontal infarcts. Prominent ventricles, sulci and cisterns consistent with age-related involutional changes. No extra-axial collection. No mass effect or midline shift. ORBITS: No acute abnormality. SINUSES: No acute abnormality. SOFT TISSUES AND SKULL: No acute soft tissue abnormality. No skull fracture. IMPRESSION: 1. No acute intracranial abnormality. 2. Multiple old infarcts and chronic ischemic white matter changes Electronically signed by: Franky Stanford MD 12/18/2024 08:30 PM EST RP Workstation: [id number]   DG Chest 1 View Result Date: 12/18/2024 EXAM: 1 VIEW(S) XRAY OF THE CHEST 12/18/2024 06:52:00 PM COMPARISON: 10/11/2024  CLINICAL HISTORY: [id number] Trauma [id number] Trauma [id number] Trauma [id number] FINDINGS: LUNGS AND PLEURA: 1.9 cm right upper lobe pulmonary nodule. Stable coarse pulmonary markings. No pleural effusion. No pneumothorax. HEART AND MEDIASTINUM: Median sternotomy and CABG noted. BONES AND SOFT TISSUES: No acute osseous abnormality. IMPRESSION: 1. No acute findings. 2. Stable 1.9 cm right upper lobe pulmonary nodule. This was not seen in November of 2025 . This may simply be related to confluence of bony and parenchymal shadows. Noncontrast CT is recommended for further evaluation. Electronically signed by: Oneil Devonshire MD 12/18/2024 07:23 PM EST RP Workstation: [id number]   DG Pelvis 1-2 Views Result Date: 12/18/2024 EXAM: 1 OR 2 VIEW(S) XRAY OF THE PELVIS 12/18/2024 06:52:00 PM COMPARISON: None available. CLINICAL HISTORY: Trauma. FINDINGS: BONES AND JOINTS: No acute fracture. Degenerative changes of visualized lower lumbar spine. Mild bilateral hip degenerative changes. SOFT TISSUES: Unremarkable. IMPRESSION: 1. No evidence of acute traumatic injury. Electronically signed by: Oneil Devonshire MD 12/18/2024 07:20 PM EST RP Workstation: [id number]     Procedures   Medications Ordered in the ED - No data to display  Medical Decision Making Amount and/or Complexity of Data Reviewed Radiology: ordered.   Patient is an 89 year old male who presents today for evaluation of a fall.  On initial assessment patient was noted to be hemodynamically stable and afebrile.  On my bedside assessment patient was noted be resting comfortably without acute distress.  C-collar in place at this point in time.  Physical examination notable for mild tenderness around the left hip without any overt signs of trauma.  No obvious deformities.  Patient has no midline spinal tenderness.  No obvious head trauma.  Patient underwent imaging evaluation here in the emergency department including CT head  as well as C-spine did not show any acute injuries.  Chest x-ray as well as pelvic x-ray without any acute trauma.  Patient did have an incidental pulmonary nodule.  I was able to clear patient's C-spine and collar here in the emergency department.  Patient was able to ambulate with the assistance of the railing without significant difficulty.  No positional or exertional symptoms.  Patient felt stable for discharge at this point in time.  I did speak with daughter over the phone regarding her workup here.  She did raise reasonable concerns based on his current living situation.  She reports the patient has had a significant cognitive decline since being diagnosed with Lewy body dementia and currently lives at home with his elderly wife.  Does also live in a 3 story house.  I feel that he is safe for discharge at this point in time however recommended that he follow-up closely with PCP for potential home health or facility placement given worsening mental status with regard to dementia as well as some seemingly worsening baseline functional impairment.  Return precautions discussed.  Patient was discharged in stable condition.  Final diagnoses:  Fall, initial encounter    ED Discharge Orders     None          Laurita Sieving, MD 12/18/24 2059    Patsey Lot, MD 12/18/24 [id number]  "

## 2024-12-18 NOTE — Discharge Instructions (Signed)
 Thank you for letting us  take care of you today.  You came to the emergency department for evaluation of a fall.  We did CT scans of your head and neck that did not show any acute abnormalities.  We also did x-rays of your chest and pelvis did not show any acute injuries.  You did have an incidental pulmonary nodule and we recommend that you follow-up with your primary care doctor for reevaluation of this.

## 2024-12-18 NOTE — ED Notes (Signed)
Patient ambulated with MD

## 2024-12-18 NOTE — ED Provider Notes (Incomplete)
" °  [place] EMERGENCY DEPARTMENT AT [facility] Provider Note   CSN: [id number] Arrival date & time: 12/18/24  1743     Patient presents with: Jose [name]   JAGJIT RINER is a 89 y.o. male Lewy Body dementia     {Add pertinent medical, surgical, social history, OB history to YEP:67052}  Fall       Prior to Admission medications  Medication Sig Start Date End Date Taking? Authorizing Provider  amLODipine  (NORVASC ) 10 MG tablet Take 10 mg by mouth daily. 11/10/22   [provider]  aspirin  EC 81 MG tablet Take 81 mg by mouth daily.    [provider]  ezetimibe  (ZETIA ) 10 MG tablet Take 1 tablet (10 mg total) by mouth daily. 04/17/24   Croitoru, Mihai, MD  feeding supplement (ENSURE PLUS HIGH PROTEIN) LIQD Take 237 mLs by mouth 3 (three) times daily between meals. 09/02/24   Ghimire, Donalda HERO, MD  furosemide  (LASIX ) 40 MG tablet Take 0.5 tablets (20 mg total) by mouth daily as needed for edema or fluid. 09/02/24 09/02/25  Ghimire, Donalda HERO, MD  guaiFENesin -dextromethorphan  (ROBITUSSIN DM) 100-10 MG/5ML syrup Take 5 mLs by mouth every 4 (four) hours as needed for cough. 09/02/24   Ghimire, Donalda HERO, MD  ipratropium-albuterol  (DUONEB) 0.5-2.5 (3) MG/3ML SOLN Take 3 mLs by nebulization every 6 (six) hours as needed. 09/02/24   Ghimire, Donalda HERO, MD  melatonin 5 MG TABS Take 1 tablet (5 mg total) by mouth at bedtime. 09/02/24   Ghimire, Donalda HERO, MD  polyethylene glycol (MIRALAX  / GLYCOLAX ) 17 g packet Take 17 g by mouth daily as needed for mild constipation. 09/02/24   Ghimire, Donalda HERO, MD  pravastatin  (PRAVACHOL ) 10 MG tablet Take 10 mg by mouth at bedtime.  08/06/17   [provider]    Allergies: Yellow jacket venom [bee venom], Benicar [olmesartan], Cozaar [losartan], Plavix [clopidogrel], Relafen [nabumetone], Statins, Tekturna [aliskiren], Atorvastatin, and Ramipril    Review of Systems  Updated Vital Signs There were no vitals taken for this  visit.  Physical Exam  (all labs ordered are listed, but only abnormal results are displayed) Labs Reviewed - No data to display  EKG: None  Radiology: No results found.  {Document cardiac monitor, telemetry assessment procedure when appropriate:32947} Procedures   Medications Ordered in the ED - No data to display    {Click here for ABCD2, HEART and other calculators REFRESH Note before signing:1}                              Medical Decision Making  ***  {Document critical care time when appropriate  Document review of labs and clinical decision tools ie CHADS2VASC2, etc  Document your independent review of radiology images and any outside records  Document your discussion with family members, caretakers and with consultants  Document social determinants of health affecting pt's care  Document your decision making why or why not admission, treatments were needed:32947:::1}   Final diagnoses:  None    ED Discharge Orders     None        "

## 2024-12-18 NOTE — ED Notes (Signed)
 Patient transported to CT

## 2024-12-23 ENCOUNTER — Ambulatory Visit: Admitting: Nurse Practitioner

## 2024-12-23 NOTE — Progress Notes (Unsigned)
 "  Office Visit    Patient Name: Jose [name] Date of Encounter: 12/23/2024  Primary Care Provider:  Royden [name], FNP Primary Cardiologist:  Jerel Balding, MD  Chief Complaint    89 year old male with a history of CAD s/p prior PCI-RCA and LAD, V-fib arrest in the setting of acute MI in 2006, s/p CABG x 4 (LIMA-LAD, SVG-PDA, sequential SVG-OM1-OM2) in 2011, LBBB, dilation of ascending aorta, hypertension, hyperlipidemia, gout, dementia, and GERD who presents for follow-up related to CAD and lower extremity swelling.  Past Medical History    Past Medical History:  Diagnosis Date   Complication of anesthesia    hard time waking up from it alot of times (10/09/2013)   Coronary artery disease    hx CABG 2011   GERD (gastroesophageal reflux disease)    not in >30 yr (10/09/2013)   Gout    H/O cardiovascular stress test 2011     mild to mod ischemia this led to cath and CABG   Hx of echocardiogram 2010   EF >55%, mild to mod MR, mild aortic reg.    Hyperlipidemia LDL goal <70    Hypertension    Kidney stone    Myocardial infarction [facility]) 1999; 2006   both acute inferior wall   Skin cancer    right nose, lower lip, right ankle, left side (10/09/2013)   Ventricular fibrillation (HCC) 2006   arrest with MI   Past Surgical History:  Procedure Laterality Date   BACK SURGERY     CORONARY ANGIOPLASTY WITH STENT PLACEMENT  1999   Stent to RCA   CORONARY ANGIOPLASTY WITH STENT PLACEMENT  2006   stent to distal RCA   CORONARY ANGIOPLASTY WITH STENT PLACEMENT  2006   mid LAD with Cypher stent   CORONARY ARTERY BYPASS GRAFT  2011   LIMA-LAD; seq VG-1st & 2nd OM of LCX; VG-PDA of RCA   CYSTOSCOPY     had alot of laser for kidney stones (10/09/2013)   CYSTOSCOPY W/ STONE MANIPULATION  12/2003   I & D EXTREMITY  06/18/2012   Procedure: IRRIGATION AND DEBRIDEMENT EXTREMITY;  Surgeon: Prentice LELON Pagan, MD;  Location: [facility] OR;  Service: Orthopedics;  Laterality: Left;  I & D left  thumb    LITHOTRIPSY     several times (10/09/2013)   LUMBAR DISC SURGERY  1998   disc ruptured (10/09/2013)   SKIN CANCER EXCISION     right nose, lower lip; right ankle, left side (10/09/2013)    Allergies  Allergies[1]   Labs/Other Studies Reviewed    The following studies were reviewed today:  Cardiac Studies & Procedures   ______________________________________________________________________________________________   STRESS TESTS  NM MYOCAR MULTI W/SPECT W 10/10/2013  Narrative CLINICAL DATA:  89 year old male with history of CAD and CABG. Chest pain  EXAM: MYOCARDIAL IMAGING WITH SPECT (REST AND EXERCISE)  GATED LEFT VENTRICULAR WALL MOTION STUDY  LEFT VENTRICULAR EJECTION FRACTION  TECHNIQUE: Standard myocardial SPECT imaging was performed after resting intravenous injection of 10 mCi Tc-49m sestamibi. Subsequently, exercise tolerance test was performed by the patient under the supervision of the Cardiology staff. At peak-stress, 30 mCi Tc-56m sestamibi was injected intravenously and standard myocardial SPECT imaging was performed. Quantitative gated imaging was also performed to evaluate left ventricular wall motion, and estimate left ventricular ejection fraction.  COMPARISON:  DG CHEST 2 VIEW dated 10/09/2013  FINDINGS: Patient exercised for 8 min and achieved target heart rate. There was downsloping ST changes on the stress  EKG.  Perfusion: No decreased profusion to the left ventricle suggests reversible ischemia or infarction.  Wall Motion: There is septal hypokinesia.  Normal contraction.  Ejection Fraction: 57 %  End-diastolic volume equals: 85 ml  End systolic volume equals: 37 ml  IMPRESSION: 1. No reversible ischemia or infarction.  2. Septal hypokinesia  3. Ejection fraction equals 57 %   Electronically Signed By: Jackquline Boxer M.D. On: 10/10/2013 11:49   ECHOCARDIOGRAM  ECHOCARDIOGRAM COMPLETE  08/28/2024  Narrative ECHOCARDIOGRAM REPORT    Patient Name:   Jose [name] Date of Exam: 08/28/2024 Medical Rec #:  [id number]      Height:       71.0 in Accession #:    [id number]     Weight:       176.4 lb Date of Birth:  January 12, 1936      BSA:          1.999 m Patient Age:    88 years       BP:           93/64 mmHg Patient Gender: M              HR:           71 bpm. Exam Location:  Inpatient  Procedure: 2D Echo (Both Spectral and Color Flow Doppler were utilized during procedure).  Indications:    acute respiratory distress  History:        Patient has prior history of Echocardiogram examinations, most recent 10/18/2021. Abnormal ECG and Prior CABG, Covid, Arrythmias:LBBB, Signs/Symptoms:Chest Pain; Risk Factors:Dyslipidemia and Hypertension.  Sonographer:    Tinnie Barefoot RDCS Referring Phys: LE[facility]   Sonographer Comments: Image acquisition challenging due to respiratory motion. IMPRESSIONS   1. Left ventricular ejection fraction, by estimation, is 45 to 50%. The left ventricle has mildly decreased function. The left ventricle demonstrates global hypokinesis. Left ventricular diastolic parameters are consistent with Grade I diastolic dysfunction (impaired relaxation). 2. Right ventricular systolic function is normal. The right ventricular size is normal. 3. Left atrial size was moderately dilated. 4. The mitral valve is normal in structure. No evidence of mitral valve regurgitation. No evidence of mitral stenosis. 5. The aortic valve is tricuspid. Aortic valve regurgitation is mild. No aortic stenosis is present. 6. Aortic dilatation noted. There is mild dilatation of the aortic root, measuring 40 mm. There is borderline dilatation of the ascending aorta, measuring 39 mm. 7. The inferior vena cava is normal in size with greater than 50% respiratory variability, suggesting right atrial pressure of 3 mmHg.  FINDINGS Left Ventricle: Left ventricular  ejection fraction, by estimation, is 45 to 50%. The left ventricle has mildly decreased function. The left ventricle demonstrates global hypokinesis. The left ventricular internal cavity size was normal in size. There is no left ventricular hypertrophy. Abnormal (paradoxical) septal motion, consistent with left bundle branch block. Left ventricular diastolic parameters are consistent with Grade I diastolic dysfunction (impaired relaxation).  Right Ventricle: The right ventricular size is normal. No increase in right ventricular wall thickness. Right ventricular systolic function is normal.  Left Atrium: Left atrial size was moderately dilated.  Right Atrium: Right atrial size was normal in size.  Pericardium: There is no evidence of pericardial effusion.  Mitral Valve: The mitral valve is normal in structure. No evidence of mitral valve regurgitation. No evidence of mitral valve stenosis.  Tricuspid Valve: The tricuspid valve is normal in structure. Tricuspid valve regurgitation is not demonstrated. No evidence of tricuspid  stenosis.  Aortic Valve: The aortic valve is tricuspid. Aortic valve regurgitation is mild. Aortic regurgitation PHT measures 690 msec. No aortic stenosis is present.  Pulmonic Valve: The pulmonic valve was normal in structure. Pulmonic valve regurgitation is not visualized. No evidence of pulmonic stenosis.  Aorta: Aortic dilatation noted. There is mild dilatation of the aortic root, measuring 40 mm. There is borderline dilatation of the ascending aorta, measuring 39 mm.  Venous: The inferior vena cava is normal in size with greater than 50% respiratory variability, suggesting right atrial pressure of 3 mmHg.  IAS/Shunts: No atrial level shunt detected by color flow Doppler.   LEFT VENTRICLE PLAX 2D LVIDd:         5.40 cm   Diastology LVIDs:         4.40 cm   LV e' medial:    3.70 cm/s LV PW:         1.10 cm   LV E/e' medial:  11.3 LV IVS:        1.10 cm   LV e'  lateral:   7.83 cm/s LVOT diam:     2.20 cm   LV E/e' lateral: 5.4 LV SV:         65 LV SV Index:   33 LVOT Area:     3.80 cm   RIGHT VENTRICLE RV Basal diam:  2.70 cm TAPSE (M-mode): 1.8 cm  LEFT ATRIUM              Index        RIGHT ATRIUM           Index LA diam:        3.90 cm  1.95 cm/m   RA Area:     16.30 cm LA Vol (A2C):   101.0 ml 50.53 ml/m  RA Volume:   39.80 ml  19.91 ml/m LA Vol (A4C):   62.6 ml  31.32 ml/m LA Biplane Vol: 80.0 ml  40.02 ml/m AORTIC VALVE LVOT Vmax:   85.20 cm/s LVOT Vmean:  54.800 cm/s LVOT VTI:    0.172 m AI PHT:      690 msec  AORTA Ao Root diam: 4.00 cm Ao Asc diam:  3.90 cm  MV E velocity: 41.90 cm/s  TRICUSPID VALVE MV A velocity: 80.50 cm/s  TR Peak grad:   0.0 mmHg MV E/A ratio:  0.52        TR Vmax:        11.10 cm/s  SHUNTS Systemic VTI:  0.17 m Systemic Diam: 2.20 cm  Oneil Parchment MD Electronically signed by Oneil Parchment MD Signature Date/Time: 08/28/2024/5:54:38 PM    Final          ______________________________________________________________________________________________     Recent Labs: 04/22/2024: TSH 3.75 08/23/2024: ALT 17 08/31/2024: Magnesium 1.7 10/11/2024: B Natriuretic Peptide 53.2; BUN 18; Creatinine, Ser 1.01; Hemoglobin 11.9; Platelets 241; Potassium 3.7; Sodium 142 10/21/2024: Pro B Natriuretic peptide (BNP) 86.0  Recent Lipid Panel No results found for: CHOL, TRIG, HDL, CHOLHDL, VLDL, LDLCALC, LDLDIRECT  History of Present Illness    89 year old male with the above past medical history including CAD s/p prior PCI-RCA and LAD, V-fib arrest in the setting of acute MI in 2006, s/p CABG x 4 (LIMA-LAD, SVG-PDA, sequential SVG-OM1-OM2) in 2011, chronic HFmrEF, LBBB, dilation of ascending aorta, hypertension, hyperlipidemia, CKD stage IIIa, gout, dementia, and GERD.  Previously followed by Dr. Ladona, he later established with Dr. Francyne.  He has a history of remote MI 1999  s/p  angioplasty of the mid RCA.  He was hospitalized in 09/2005 in the setting of acute MI, complicated by V-fib arrest.  Cardiac catheterization at the time showed occluded distal RCA with heavy thrombus burden, 90% stenosis of the LAD after the origin of the third diagonal, 90% stenosis of ostial second diagonal (small vessel), mild luminal irregularities in the LCx EF 55 to 60%.  He underwent successful relative thrombectomy followed by successful PTCA/DES-distal RCA.  He subsequently underwent staged PCI/DES-mid LAD.  He ultimately underwent CABG x 4 in 2011.  Myoview in 2014 was negative for ischemia.  He was last seen in office on 04/17/2024 and was stable from a cardiac standpoint.  He denied symptoms concerning for angina. Hospitalized in September 2025 in the setting of acute hypoxic respiratory failure in the setting of COVID pneumonia. Echocardiogram in 08/2024 showed EF 45 to 50%, mildly decreased LV function, LV global hypokinesis, G1 DD, normal RV systolic function, mild aortic valve regurgitation, mild dilation of the ascending aorta measuring 39 mm, dilation of the aortic root measuring 40 mm essentially unchanged compared to prior echo.  Patient's daughter contacted our office on 11/20/2024 with concern for increased lower extremity swelling.  He was seen in the ED on 12/18/2024 in the setting of recurrent falls.  ED workup was unremarkable.  He presents today for follow-up.  Since his last visit  CAD: HFrEF: LBBB: Dilation of ascending aorta: Hypertension: Hyperlipidemia: CKD stage IIIa: Disposition:  Home Medications    Current Outpatient Medications  Medication Sig Dispense Refill   amLODipine  (NORVASC ) 10 MG tablet Take 10 mg by mouth daily.     aspirin  EC 81 MG tablet Take 81 mg by mouth daily.     ezetimibe  (ZETIA ) 10 MG tablet Take 1 tablet (10 mg total) by mouth daily. 90 tablet 3   feeding supplement (ENSURE PLUS HIGH PROTEIN) LIQD Take 237 mLs by mouth 3 (three) times daily  between meals.     furosemide  (LASIX ) 40 MG tablet Take 0.5 tablets (20 mg total) by mouth daily as needed for edema or fluid.     guaiFENesin -dextromethorphan  (ROBITUSSIN DM) 100-10 MG/5ML syrup Take 5 mLs by mouth every 4 (four) hours as needed for cough.     ipratropium-albuterol  (DUONEB) 0.5-2.5 (3) MG/3ML SOLN Take 3 mLs by nebulization every 6 (six) hours as needed.     melatonin 5 MG TABS Take 1 tablet (5 mg total) by mouth at bedtime.     polyethylene glycol (MIRALAX  / GLYCOLAX ) 17 g packet Take 17 g by mouth daily as needed for mild constipation.     pravastatin  (PRAVACHOL ) 10 MG tablet Take 10 mg by mouth at bedtime.   4   No current facility-administered medications for this visit.     Review of Systems    ***.  All other systems reviewed and are otherwise negative except as noted above.    Physical Exam    VS:  There were no vitals taken for this visit. , BMI There is no height or weight on file to calculate BMI.     GEN: Well nourished, well developed, in no acute distress. HEENT: normal. Neck: Supple, no JVD, carotid bruits, or masses. Cardiac: RRR, no murmurs, rubs, or gallops. No clubbing, cyanosis, edema.  Radials/DP/PT 2+ and equal bilaterally.  Respiratory:  Respirations regular and unlabored, clear to auscultation bilaterally. GI: Soft, nontender, nondistended, BS + x 4. MS: no deformity or atrophy. Skin: warm and dry, no rash. Neuro:  Strength  and sensation are intact. Psych: Normal affect.  Accessory Clinical Findings    ECG personally reviewed by me today -    - no acute changes.   Lab Results  Component Value Date   WBC 7.1 10/11/2024   HGB 11.9 (L) 10/11/2024   HCT 36.8 (L) 10/11/2024   MCV 95.8 10/11/2024   PLT 241 10/11/2024   Lab Results  Component Value Date   CREATININE 1.01 10/11/2024   BUN 18 10/11/2024   NA 142 10/11/2024   K 3.7 10/11/2024   CL 103 10/11/2024   CO2 26 10/11/2024   Lab Results  Component Value Date   ALT 17  08/23/2024   AST 37 08/23/2024   ALKPHOS 47 08/23/2024   BILITOT 0.9 08/23/2024   No results found for: CHOL, HDL, LDLCALC, LDLDIRECT, TRIG, CHOLHDL  Lab Results  Component Value Date   HGBA1C  06/21/2010    5.4 (NOTE)                                                                       According to the ADA Clinical Practice Recommendations for 2011, when HbA1c is used as a screening test:   >=6.5%   Diagnostic of Diabetes Mellitus           (if abnormal result  is confirmed)  5.7-6.4%   Increased risk of developing Diabetes Mellitus  References:Diagnosis and Classification of Diabetes Mellitus,Diabetes Care,2011,34(Suppl 1):S62-S69 and Standards of Medical Care in         Diabetes - 2011,Diabetes Care,2011,34  (Suppl 1):S11-S61.    Assessment & Plan    1.  ***  No BP recorded.  {Refresh Note OR Click here to enter BP  :1}***   Damien JAYSON Braver, NP 12/23/2024, 8:02 AM       [1]  Allergies Allergen Reactions   Yellow Jacket Venom [Bee Venom] Hives, Shortness Of Breath and Rash   Benicar [Olmesartan]     Enlarged breasts   Cozaar [Losartan] Other (See Comments)    Reaction unknown   Plavix [Clopidogrel] Other (See Comments)    Reaction unknown   Relafen [Nabumetone] Other (See Comments)    Unknown    Statins Other (See Comments)    unknown   Tekturna [Aliskiren] Other (See Comments)    Reaction unknown    Atorvastatin Other (See Comments)   Ramipril Cough   "

## 2024-12-25 ENCOUNTER — Ambulatory Visit (HOSPITAL_COMMUNITY)
Admission: RE | Admit: 2024-12-25 | Discharge: 2024-12-25 | Disposition: A | Source: Ambulatory Visit | Attending: Internal Medicine | Admitting: Internal Medicine

## 2024-12-25 DIAGNOSIS — F03B2 Unspecified dementia, moderate, with psychotic disturbance: Secondary | ICD-10-CM | POA: Insufficient documentation

## 2024-12-25 MED ORDER — IOFLUPANE I 123 185 MBQ/2.5ML IV SOLN
5.0000 | Freq: Once | INTRAVENOUS | Status: AC
  Administered 2024-12-25: 4.47 via INTRAVENOUS

## 2025-01-05 ENCOUNTER — Other Ambulatory Visit

## 2025-01-25 ENCOUNTER — Encounter

## 2025-01-25 ENCOUNTER — Ambulatory Visit

## 2025-02-02 ENCOUNTER — Ambulatory Visit: Admitting: Nurse Practitioner
